# Patient Record
Sex: Male | Born: 1955
Health system: Southern US, Community
[De-identification: ages and names within clinical notes are randomized; demographics above are authoritative.]

## PROBLEM LIST (undated history)

## (undated) DIAGNOSIS — E785 Hyperlipidemia, unspecified: Secondary | ICD-10-CM

## (undated) DIAGNOSIS — K219 Gastro-esophageal reflux disease without esophagitis: Secondary | ICD-10-CM

## (undated) DIAGNOSIS — B059 Measles without complication: Secondary | ICD-10-CM

## (undated) DIAGNOSIS — M7752 Other enthesopathy of left foot: Secondary | ICD-10-CM

## (undated) DIAGNOSIS — I10 Essential (primary) hypertension: Secondary | ICD-10-CM

## (undated) DIAGNOSIS — Z8719 Personal history of other diseases of the digestive system: Secondary | ICD-10-CM

## (undated) DIAGNOSIS — J189 Pneumonia, unspecified organism: Secondary | ICD-10-CM

## (undated) DIAGNOSIS — I2510 Atherosclerotic heart disease of native coronary artery without angina pectoris: Secondary | ICD-10-CM

## (undated) DIAGNOSIS — R55 Syncope and collapse: Secondary | ICD-10-CM

## (undated) DIAGNOSIS — Z8709 Personal history of other diseases of the respiratory system: Secondary | ICD-10-CM

## (undated) DIAGNOSIS — R002 Palpitations: Secondary | ICD-10-CM

## (undated) DIAGNOSIS — Z8701 Personal history of pneumonia (recurrent): Secondary | ICD-10-CM

## (undated) DIAGNOSIS — E669 Obesity, unspecified: Secondary | ICD-10-CM

## (undated) DIAGNOSIS — B269 Mumps without complication: Secondary | ICD-10-CM

## (undated) DIAGNOSIS — E119 Type 2 diabetes mellitus without complications: Secondary | ICD-10-CM

## (undated) DIAGNOSIS — I209 Angina pectoris, unspecified: Secondary | ICD-10-CM

## (undated) DIAGNOSIS — G629 Polyneuropathy, unspecified: Secondary | ICD-10-CM

## (undated) DIAGNOSIS — I499 Cardiac arrhythmia, unspecified: Secondary | ICD-10-CM

## (undated) DIAGNOSIS — R079 Chest pain, unspecified: Secondary | ICD-10-CM

## (undated) DIAGNOSIS — M1990 Unspecified osteoarthritis, unspecified site: Secondary | ICD-10-CM

## (undated) DIAGNOSIS — J449 Chronic obstructive pulmonary disease, unspecified: Secondary | ICD-10-CM

## (undated) HISTORY — DX: Palpitations: R00.2

## (undated) HISTORY — DX: Other enthesopathy of left foot and ankle: M77.52

## (undated) HISTORY — DX: Mumps without complication: B26.9

## (undated) HISTORY — PX: COLONOSCOPY: SHX174

## (undated) HISTORY — DX: Obesity, unspecified: E66.9

## (undated) HISTORY — PX: EYE SURGERY: SHX253

## (undated) HISTORY — PX: FOOT SURGERY: SHX648

## (undated) HISTORY — DX: Chest pain, unspecified: R07.9

## (undated) HISTORY — DX: Measles without complication: B05.9

## (undated) HISTORY — DX: Syncope and collapse: R55

## (undated) HISTORY — DX: Type 2 diabetes mellitus without complications: E11.9

## (undated) HISTORY — PX: CARDIAC CATHETERIZATION: SHX172

## (undated) HISTORY — DX: Hyperlipidemia, unspecified: E78.5

## (undated) HISTORY — DX: Essential (primary) hypertension: I10

## (undated) HISTORY — DX: Angina pectoris, unspecified: I20.9

## (undated) HISTORY — PX: ANTERIOR CERVICAL DISCECTOMY: SHX1160

---

## 2013-03-21 HISTORY — PX: SPINE SURGERY: SHX786

## 2013-07-25 ENCOUNTER — Ambulatory Visit: Payer: 59

## 2013-11-13 ENCOUNTER — Ambulatory Visit: Payer: 59 | Attending: Family Medicine

## 2013-11-13 DIAGNOSIS — IMO0001 Reserved for inherently not codable concepts without codable children: Secondary | ICD-10-CM | POA: Diagnosis present

## 2013-11-13 DIAGNOSIS — M545 Low back pain, unspecified: Secondary | ICD-10-CM | POA: Diagnosis not present

## 2014-01-31 LAB — HEMOGLOBIN A1C: Hgb A1c MFr Bld: 11.5 % — AB (ref 4.0–6.0)

## 2014-02-14 ENCOUNTER — Ambulatory Visit (INDEPENDENT_AMBULATORY_CARE_PROVIDER_SITE_OTHER): Payer: 59 | Admitting: Endocrinology

## 2014-02-14 ENCOUNTER — Encounter: Payer: 59 | Attending: Endocrinology | Admitting: Nutrition

## 2014-02-14 ENCOUNTER — Encounter: Payer: Self-pay | Admitting: Endocrinology

## 2014-02-14 VITALS — BP 122/59 | HR 65 | Temp 98.0°F | Resp 16 | Ht 70.75 in | Wt 206.0 lb

## 2014-02-14 DIAGNOSIS — Z794 Long term (current) use of insulin: Secondary | ICD-10-CM | POA: Insufficient documentation

## 2014-02-14 DIAGNOSIS — E785 Hyperlipidemia, unspecified: Secondary | ICD-10-CM

## 2014-02-14 DIAGNOSIS — E1165 Type 2 diabetes mellitus with hyperglycemia: Secondary | ICD-10-CM | POA: Diagnosis not present

## 2014-02-14 DIAGNOSIS — E1140 Type 2 diabetes mellitus with diabetic neuropathy, unspecified: Secondary | ICD-10-CM | POA: Diagnosis not present

## 2014-02-14 DIAGNOSIS — E669 Obesity, unspecified: Secondary | ICD-10-CM

## 2014-02-14 DIAGNOSIS — E1169 Type 2 diabetes mellitus with other specified complication: Secondary | ICD-10-CM | POA: Insufficient documentation

## 2014-02-14 DIAGNOSIS — IMO0002 Reserved for concepts with insufficient information to code with codable children: Secondary | ICD-10-CM

## 2014-02-14 MED ORDER — INSULIN GLARGINE 300 UNIT/ML ~~LOC~~ SOPN: 120.0000 [IU] | PEN_INJECTOR | Freq: Every day | SUBCUTANEOUS | Status: DC

## 2014-02-14 MED ORDER — INSULIN LISPRO 200 UNIT/ML ~~LOC~~ SOPN: 40.0000 [IU] | PEN_INJECTOR | Freq: Three times a day (TID) | SUBCUTANEOUS | Status: DC

## 2014-02-14 NOTE — Patient Instructions (Addendum)
TOUJEO insulin: This insulin provides blood sugar control for  24 hours.   Start with 120 units at breakfast daily and increase by 5 units every 3-4 days until the waking up sugars are under 150. Then continue the same dose.  If blood sugar is under 90 for 2 days in a row, reduce the dose by 10 units. Note that this insulin does not control the rise of blood sugar with meals   HUMALOG U-200 insulin: Take this about 15 minutes before each main meal Start taking 70 units at breakfast, 40 units at lunch and 70 units before supper If the blood sugar about 2 hours after any particular meal is still over 200 and may increase to Humalog by 5 units for that meal  Check the blood sugar before breakfast and at least once or twice a day about 2 hours after meals, bring monitor for review

## 2014-02-14 NOTE — Progress Notes (Signed)
Patient ID: Aristeo Hankerson, male   DOB: 04/15/1955, 58 y.o.   MRN: [id number]           Reason for Appointment: Consultation for Type 2 Diabetes  Referring physician: Girtha Rm  History of Present Illness:          Diagnosis: Type 2 diabetes mellitus, date of diagnosis: 12/2011      Past history:  At the time of diagnosis he had symptoms of frequent urination, thirst and blurred vision He thinks his blood sugar was about 600.  His physician started him on Novolog and also Metformin  Details of initial treatment are not available but he also apparently took Victoza at some point before insulin and he does not think it worked After some time he was also given Levemir in addition to his NovoLog His blood sugars were somewhat better controlled after some time and were averaging about 200. He thinks that because of diarrhea his metformin was stopped after a few months.  Apparently in 10/2012 he had epidural steroid and with this his blood sugar went up to 600.   He had continued to have low back pain also has not had  repeat epidural steroid injections However he thinks his blood sugars had been persistently poorly controlled since then. He has been under the care of an endocrinologist in [place]. Actos was started in 06/2012 without much benefit. He was also tried on Invokana for about 3 months and reportedly had no benefit from this.  Recent history:  He was started on U-500 instead of Levemir and NovoLog in late 2014 The dose has been adjusted periodically but has been about the same since about 04/2013 His blood sugars have been continuing to be poorly controlled with A1c ranging from 10.5-12.5 and recently 11.5. He also had been recommended an insulin pump but he did not want to do this because of the cost and difficulty using it. His insulin regimen is listed below, taking his insulin about 15-30 30 minutes before breakfast and lunch but 1 hour before dinner; last dose is at bedtime. He did  not bring his blood sugar records but his usually checking his sugar 3-4 times a day. He thinks that his blood sugars are progressively higher later in the day. He does occasionally have symptoms of hypoglycemia, mostly midday between 11 AM-3 PM, usually about once a week He is usually having excessive thirst and is drinking a lot of water.  Usually has sugar-free drinks otherwise. He is also tending to lose weight recently He has seen the dietitian and has been trying to usually get low carbohydrate low fat meals. Hypoglycemia: As above  Oral hypoglycemic drugs the patient is taking are: Actos 45 mg      Side effects from medications have been: Diarrhea from metformin INSULIN regimen is described as:  U-500 insulin   Compliance with the medical regimen: Good   Glucose monitoring:  done 2-4 times a day         Glucometer: Contour next      Blood Glucose readings by recall  PREMEAL Breakfast Lunch Dinner Bedtime  Overall   Glucose range:  350   400   500   550    Median:        Self-care: The diet that the patient has been following is: tries to limit .     Meals: 3 meals per day. Breakfast is usually egg and toast, lunch is usually a meat and crackers, evening meal usually chicken  and vegetables.  Snacks: Popcorn, orange or apple           Exercise:  walking up to 30 minutes, about 3 times a week.          Dietician visit, most recent: 11/2012 .               Weight history: 202-236  Wt Readings from Last 3 Encounters:  02/14/14 206 lb (93.441 kg)   Glycemic control:   Lab Results  Component Value Date   HGBA1C 11.5* 01/31/2014   No results found for: GLUF, MICROALBUR, [place], CREATININE       Medication List       This list is accurate as of: 02/14/14  8:22 PM.  Always use your most recent med list.               alprazolam 2 MG tablet  Commonly known as:  XANAX     atorvastatin 40 MG tablet  Commonly known as:  LIPITOR     BAYER CONTOUR NEXT TEST VI  by In  Vitro route. Checks 4 times daily     buPROPion 150 MG 12 hr tablet  Commonly known as:  WELLBUTRIN SR     gabapentin 600 MG tablet  Commonly known as:  NEURONTIN     HUMULIN R 500 UNIT/ML Soln injection  Generic drug:  insulin regular human CONCENTRATED  25-20-20-15     Insulin Glargine 300 UNIT/ML Sopn  Commonly known as:  TOUJEO SOLOSTAR  Inject 120 Units into the skin daily before breakfast.     Insulin Lispro (Human) 200 UNIT/ML Sopn  Commonly known as:  HUMALOG KWIKPEN  Inject 40-70 Units into the skin 3 (three) times daily before meals.     LANOXIN 0.25 MG tablet  Generic drug:  digoxin  Take 0.25 mg by mouth daily.     lisinopril 5 MG tablet  Commonly known as:  PRINIVIL,ZESTRIL     pioglitazone 45 MG tablet  Commonly known as:  ACTOS  Take 45 mg by mouth daily.        Allergies:  Allergies  Allergen Reactions  . Prednisone     Patient allergic to steroids    Past Medical History  Diagnosis Date  . Diabetes mellitus without complication     Past Surgical History  Procedure Laterality Date  . Spine surgery  03/21/13    Lumbar fusion    Family History  Problem Relation Age of Onset  . Diabetes Mother   . Diabetes Father   . Heart disease Father   . Diabetes Brother     Social History:  reports that he quit smoking about 10 months ago. He has never used smokeless tobacco. His alcohol and drug histories are not on file.    Review of Systems       Vision is normal. Most recent eye exam was in 2012       Lipids: In 12/15 showed LDL 69, HDL 24 and triglycerides 168.  Has been on Lipitor for quite some time      No results found for: CHOL, HDL, LDLCALC, LDLDIRECT, TRIG, CHOLHDL                Skin: No rash or infections     Thyroid:  No  unusual fatigue.  No history of thyroid disease     The blood pressure has been normal without medications, is taking low-dose lisinopril for "kidney protection"     No swelling  of feet.     No shortness  of breath or chest tightness  on exertion.    He has had known CAD but not symptomatic     Bowel habits: Normal.      No joint  pains. Back pain has been significant and persistent, takes oxycodone as needed     No recent urinary symptoms.  Has nocturia 1-2 times.  Has history of candida balanitis      Has history of anxiety on medications         He has a history of left leg  Numbness, tingling and burning and also some in his left foot.  Taking gabapentin high doses for this.  LABS:  Office Visit on 02/14/2014  Component Date Value Ref Range Status  . Hgb A1c MFr Bld 01/31/2014 11.5* 4.0 - 6.0 % Final    Physical Examination:  BP 122/59 mmHg  Pulse 65  Temp(Src) 98 F (36.7 C)  Resp 16  Ht 5' 10.75" (1.797 m)  Wt 206 lb (93.441 kg)  BMI 28.94 kg/m2  SpO2 95%  GENERAL:  He is averagely built and nourished, low abdominal obesity HEENT:         Eye exam shows normal external appearance. Fundus exam shows no retinopathy. Oral exam shows normal mucosa .  NECK:         General:  Neck exam shows no lymphadenopathy. Carotids are normal to palpation and no bruit heard.  Thyroid is not enlarged and no nodules felt.   LUNGS:         Chest is symmetrical. Lungs are clear to auscultation.Marland Kitchen   HEART:         Heart sounds:  S1 and S2 are normal. No murmurs or clicks heard., no S3 or S4.   ABDOMEN:   There is no distention present. Liver and spleen are not palpable. No other mass or tenderness present.  EXTREMITIES:     There is no edema. No skin lesions present.Marland Kitchen  NEUROLOGICAL:   Vibration sense is moderately reduced in toes. Ankle jerks are absent bilaterally.          Diabetic foot exam shows significantly decreased monofilament sensation in the toes especially left fifth toe, no skin lesions or ulcers on the feet and normal pedal pulses MUSCULOSKELETAL:       There is no enlargement or deformity of the joints. Spine is normal to inspection.Marland Kitchen   SKIN:       No rash or lesions of  concern.        ASSESSMENT:  Diabetes type 2, uncontrolled   The patient appears to be highly insulin deficient and also has significant resistance requiring about 400 units of insulin a day without much benefit. He is also symptomatic from the hyperglycemia and this may be causing weight loss also This is despite being very compliant with his diet regimen and low carbohydrate intake. He is moderately active   Unclear why he is still requiring large doses of insulin even with taking Actos as a sensitizer He thinks he has failed trials of Victoza and Invokana in the past although  history is not available from his previous endocrinologist  Discussed with the patient that because of his high insulin requirement he is a good candidate for insulin pump which would provide more efficient insulin delivery uncontrolled.  However he is very reluctant to do this partly because of cost and also poor understanding of how this works. His current regimen of U-500 insulin  is not providing consistent control with some tendency to hypoglycemia midday and continued overnight hyperglycemia.  He is reluctant to continue this regimen  Complications: Peripheral neuropathy, unknown urine microalbumin status  History of hyperlipidemia, with good control of LDL but persistently low HDL Also reportedly has CAD, asymptomatic  Has had minimal hypertension controlled with lisinopril 5 mg  Chronic persistent low back pain  PLAN:   He will be seen by the diabetes nurse educator today for detailed diabetes education and explanation of how insulin pump therapy would work  We will try him on a combination of basal and bolus insulin regimen using U-300 glargine/Toujeo and also U-200 Humalog insulin at mealtimes.  Detailed insulin doses written out and instructions for titration also given in detail.  He will need relatively large doses of Humalog at breakfast and supper and less at lunch when he has less carbohydrate.   These were reviewed by the nurse educator also with him.  Given patient handouts on Toujeo and Humalog U-200 along with co-pay cards  May consider a trial of extended release metformin possibly in combination with [place] or Januvia for better tolerability and benefit.  Recommended regular eye exams  He will bring his glucose monitor for download on the next visit in 3 weeks  Also recommended bringing records from his previous endocrinologist for review  Patient Instructions  TOUJEO insulin: This insulin provides blood sugar control for  24 hours.   Start with 120 units at breakfast daily and increase by 5 units every 3-4 days until the waking up sugars are under 150. Then continue the same dose.  If blood sugar is under 90 for 2 days in a row, reduce the dose by 10 units. Note that this insulin does not control the rise of blood sugar with meals   HUMALOG U-200 insulin: Take this about 15 minutes before each main meal Start taking 70 units at breakfast, 40 units at lunch and 70 units before supper If the blood sugar about 2 hours after any particular meal is still over 200 and may increase to Humalog by 5 units for that meal  Check the blood sugar before breakfast and at least once or twice a day about 2 hours after meals, bring monitor for review    Counseling time over 50% of today's 60 minute visit  Consultation note forwarded to PCP  [facility] 02/14/2014, 8:22 PM   Note: This office note was prepared with Dragon voice recognition system technology. Any transcriptional errors that result from this process are unintentional.

## 2014-02-20 ENCOUNTER — Telehealth: Payer: Self-pay | Admitting: Nutrition

## 2014-02-20 ENCOUNTER — Other Ambulatory Visit: Payer: Self-pay | Admitting: *Deleted

## 2014-02-20 ENCOUNTER — Telehealth: Payer: Self-pay | Admitting: Endocrinology

## 2014-02-20 MED ORDER — GLUCOSE BLOOD VI STRP: ORAL_STRIP | Status: DC

## 2014-02-20 NOTE — Progress Notes (Signed)
This is a new patient that is here to start on a new insulin regimen.  We discussed the need for basal insulin and then discussed the timing of Toujeo.  We reviewed how to use the pen and he reported good understanding of this.  Written dosage was given to him of 120u before breakfast.  We then discussed the need to increase the dose if his fasting blood sugars do not come down.  He was told to increase this dose by 5u every 4 days until his FBSs are less than 150.  He reported good understanding of this.    We also discussed the U200 Humalog insulin and when and how to take this.  Written instructions were given for 70u 15 min. Before breakfast, 40u before lunch and 70u before supper.  We discussed the need to increase/decrease this dose when eating more carbohydrates, or when exercising.  He has never done this before and so we reviewed what carbs were, and the need to take more Humalog when eating more carbs in each meal.  He reported good understanding of this and had no questions.  We reviewed why he needs to test his blood sugars ac and HS, and why he needs to do this 2 hours after one meal each day.  Prescriptions were increase for test strips  We also reviewed low blood sugars--symptoms and treatment.  He has not had any of these in a "very long time", but he used to treat them with nabs and chocolate candy.  He will use glucose tablets, or juice of soda if needed.  He was given a sample of glucose tablets to keep with him if needed.  We discussed the advantages of insulin pump therapy, and he was shown some different models.  He does not want to do this at this time.  He says that he has several grandchildren that "climb all over him, and he has a pool they swim in constantly all summer".  He would prefer to try to control his blood sugars without the use of the insulin pump at this time.    He will call if questions.

## 2014-02-20 NOTE — Telephone Encounter (Signed)
Pt needs refills on test strips for contour next ez call into [facility]

## 2014-02-20 NOTE — Telephone Encounter (Signed)
Patient reports that his pharmacy did not have the medications and he has not started on the new insulin yet.  He says that he was notified that it came in today, and he will start on this tomorrow morning.  His FBS today was in the 300s.

## 2014-02-20 NOTE — Patient Instructions (Signed)
Test before meals, at bedtime and 2hr. After one meal each day Take 120u of Toujeo insulin every morning before breakfast.  Increase the dose by 5u q 4 days until FBSs are less than 150. Take 70u of U 200 Humalog before breakfast, 40u before lunch and 70u before supper.  Do this 15 min. Before the meal.

## 2014-02-20 NOTE — Telephone Encounter (Signed)
rx sent

## 2014-02-20 NOTE — Telephone Encounter (Signed)
Message left on machine to call me with blood sugar readings. 

## 2014-02-27 ENCOUNTER — Telehealth: Payer: Self-pay

## 2014-02-27 NOTE — Telephone Encounter (Signed)
Spoke with Jinny Blossom from referring  Dr  Hali Marry Office at [facility] [phone] she stated she will fax over records. Pt has also seen Dr Sherryle Lis at [facility] , pt will need to sign  medical release to get those records. I left message with pt on his phone to call [facility]:.I will  Inform  pt to retreive his records himself or possibly come by to sign medical record release form  before appt ..daj

## 2014-02-27 NOTE — Telephone Encounter (Signed)
RECORDS RECEIVED DAJ

## 2014-02-28 NOTE — Telephone Encounter (Signed)
RECORDS ON FILE : WILL VERIFY WITH PT TO SEE IF WE NEED TO OBTAIN ANY MORE RECORDS

## 2014-03-01 ENCOUNTER — Ambulatory Visit (INDEPENDENT_AMBULATORY_CARE_PROVIDER_SITE_OTHER): Payer: 59 | Admitting: Cardiology

## 2014-03-01 ENCOUNTER — Encounter: Payer: Self-pay | Admitting: Cardiology

## 2014-03-01 VITALS — BP 120/78 | HR 65 | Ht 70.0 in | Wt 219.4 lb

## 2014-03-01 DIAGNOSIS — R002 Palpitations: Secondary | ICD-10-CM | POA: Insufficient documentation

## 2014-03-01 DIAGNOSIS — E785 Hyperlipidemia, unspecified: Secondary | ICD-10-CM

## 2014-03-01 DIAGNOSIS — R079 Chest pain, unspecified: Secondary | ICD-10-CM | POA: Insufficient documentation

## 2014-03-01 MED ORDER — NITROGLYCERIN 0.4 MG SL SUBL
0.4000 mg | SUBLINGUAL_TABLET | SUBLINGUAL | Status: DC | PRN
Start: 2014-03-01 — End: 2017-01-26

## 2014-03-01 NOTE — Progress Notes (Signed)
[place] Mata Date of Birth:  [birth year] [place] [address] [place], [place]  [address] [phone]        Fax   [phone]   History of Present Illness: This pleasant 59 year old gentleman is seen by me for the first time today.  He is a medical patient of Dr. Hali Marry in [place].  The patient is now reestablishing care in the [place] area.  His wife works for the [employer] system as a Building control surveyor. The patient is being seen because of pressure no chest discomfort.  He has multiple risk factors for ischemic heart disease.  He had a heart catheterization in 2005 which showed only mild plaque.  Had a second heart catheterization in 2014 in [facility] which showed nonobstructive coronary artery disease and an ejection fraction of 65%.  The patient has a history of paroxysmal supraventricular tachycardia and is on digoxin 0.25 mg daily the patient had had prediabetes for about 10 years and 2 years ago became a full blown diabetic.  His most recent hemoglobin A1c is 11.5.  He is now being followed for his diabetes here in [place] by Dr. Dwyane Dee. The patient drinks 2-3 cups of caffeinated coffee in the morning.  He does not consume any alcohol.  He continues to smoke a half a pack of cigarettes a day against advice. He has a history of chronic back pain and is on permanent medical disability because of his back. His family history reveals that his mother died of a heart attack at age 53 and his father died of a massive heart attack at age 89.  He has a brother with atrial fibrillation and a sister who has a pacemaker.  Overall maternal uncles who have died from heart attacks.   Current Outpatient Prescriptions  Medication Sig Dispense Refill  . alprazolam (XANAX) 2 MG tablet   5  . aspirin 81 MG tablet Take 81 mg by mouth daily.    Marland Kitchen atorvastatin (LIPITOR) 40 MG tablet   3  . buPROPion (WELLBUTRIN SR) 150 MG 12 hr tablet    10  . digoxin (LANOXIN) 0.25 MG tablet Take 0.25 mg by mouth daily.    Marland Kitchen gabapentin (NEURONTIN) 600 MG tablet   3  . glucose blood (BAYER CONTOUR NEXT TEST) test strip Use to check blood sugar 5 times per day dx code E11.65 150 each 3  . HUMULIN R 500 UNIT/ML SOLN injection 25-20-20-15  3  . Insulin Glargine (TOUJEO SOLOSTAR) 300 UNIT/ML SOPN Inject 120 Units into the skin daily before breakfast. 6 pen 1  . Insulin Lispro, Human, (HUMALOG KWIKPEN) 200 UNIT/ML SOPN Inject 40-70 Units into the skin 3 (three) times daily before meals. 6 pen 3  . lisinopril (PRINIVIL,ZESTRIL) 5 MG tablet   3  . pioglitazone (ACTOS) 45 MG tablet Take 45 mg by mouth daily.    . nitroGLYCERIN (NITROSTAT) 0.4 MG SL tablet Place 1 tablet (0.4 mg total) under the tongue every 5 (five) minutes as needed for chest pain. 90 tablet 3   No current facility-administered medications for this visit.    Allergies  Allergen Reactions  . Prednisone     Patient allergic to steroids    Patient Active Problem List   Diagnosis Date Noted  . Chest pain 03/01/2014  . Intermittent palpitations 03/01/2014  . Type II diabetes mellitus, uncontrolled 02/14/2014  . Painful diabetic neuropathy 02/14/2014  . Hyperlipidemia 02/14/2014  History  Smoking status  . Former Smoker  . Quit date: 03/17/2013  Smokeless tobacco  . Never Used    History  Alcohol Use: Not on file    Family History  Problem Relation Age of Onset  . Diabetes Mother   . Diabetes Father   . Heart disease Father   . Diabetes Brother     Review of Systems: Constitutional: no fever chills diaphoresis or fatigue or change in weight.  Head and neck: no hearing loss, no epistaxis, no photophobia or visual disturbance. Respiratory: No cough, shortness of breath or wheezing. Cardiovascular: No chest pain peripheral edema, palpitations. Gastrointestinal: No abdominal distention, no abdominal pain, no change in bowel habits hematochezia or  melena. Genitourinary: No dysuria, no frequency, no urgency, no nocturia. Musculoskeletal:No arthralgias, no back pain, no gait disturbance or myalgias. Neurological: No dizziness, no headaches, no numbness, no seizures, no syncope, no weakness, no tremors. Hematologic: No lymphadenopathy, no easy bruising. Psychiatric: No confusion, no hallucinations, no sleep disturbance.   Wt Readings from Last 3 Encounters:  03/01/14 219 lb 6.4 oz (99.519 kg)  02/14/14 206 lb (93.441 kg)    Physical Exam: Filed Vitals:   03/01/14 1106  BP: 120/78  Pulse: 65   the general appearance reveals a well-developed well-nourished gentleman in no distress.  The patient appears to be in no distress.  Head and neck exam reveals that the pupils are equal and reactive.  The extraocular movements are full.  There is no scleral icterus.  Mouth and pharynx are benign.  No lymphadenopathy.  No carotid bruits.  The jugular venous pressure is normal.  Thyroid is not enlarged or tender.  Chest reveals mild inspiratory rales at right base.  Expansion of the chest is symmetrical.  Heart reveals no abnormal lift or heave.  First and second heart sounds are normal.  There is no murmur gallop rub or click.  The abdomen is soft and nontender.  Bowel sounds are normoactive.  There is no hepatosplenomegaly or mass.  There are no abdominal bruits.  Extremities reveal no phlebitis or edema.  Pedal pulses are good.  There is no cyanosis or clubbing.  Neurologic exam is normal strength and no lateralizing weakness.  No sensory deficits.  Integument reveals no rash  EKG today shows normal sinus rhythm and is within normal limits  Assessment / Plan: 1.  Exertional chest discomfort 2.  Multiple risk factors for premature coronary artery disease including strong family history, dyslipidemia, poorly controlled diabetes with hemoglobin A1c of 11.5, and ongoing smoking 3.  History of paroxysmal tachycardia controlled on  digoxin 4.  On medical disability since April 2015 because of chronic back pain.  History of major back surgery in January 2015.  Disposition: We will start him on baby aspirin 81 mg daily.  We gave him a prescription for Nitrostat to have on hand.  We will get a chest x-ray.  He does have a few rales at the right base which may be related to his smoking.  We will have him return for a walking Lexiscan Myoview stress test. Recheck here in one month for follow-up office visit

## 2014-03-01 NOTE — Patient Instructions (Signed)
START ASPIRIN 81 MG DAILY  Use your NTG under your tongue for recurrent chest pain. May take one tablet every 5 minutes. If you are still having discomfort after 3 tablets in 15 minutes, call 911.   Your physician has requested that you have a lexiscan myoview. For further information please visit [email or web address]. Please follow instruction sheet, as given. WALKING LEXISCAN  A chest x-ray takes a picture of the organs and structures inside the chest, including the heart, lungs, and blood vessels. This test can show several things, including, whether the heart is enlarges; whether fluid is building up in the lungs; and whether pacemaker / defibrillator leads are still in place. [place]  Your physician recommends that you schedule a follow-up appointment in: [place]

## 2014-03-03 ENCOUNTER — Encounter: Payer: 59 | Attending: Endocrinology

## 2014-03-03 VITALS — Ht 70.75 in | Wt 218.9 lb

## 2014-03-03 DIAGNOSIS — IMO0002 Reserved for concepts with insufficient information to code with codable children: Secondary | ICD-10-CM

## 2014-03-03 DIAGNOSIS — E1140 Type 2 diabetes mellitus with diabetic neuropathy, unspecified: Secondary | ICD-10-CM | POA: Insufficient documentation

## 2014-03-03 DIAGNOSIS — E1165 Type 2 diabetes mellitus with hyperglycemia: Secondary | ICD-10-CM | POA: Insufficient documentation

## 2014-03-03 DIAGNOSIS — Z794 Long term (current) use of insulin: Secondary | ICD-10-CM | POA: Insufficient documentation

## 2014-03-06 ENCOUNTER — Telehealth: Payer: Self-pay | Admitting: Nutrition

## 2014-03-06 NOTE — Telephone Encounter (Signed)
Patient phoned and told of the new insulin doses.  He re verbalized the doses correctly.   He was told to notify us if he has a severe low blood sugar requiring assistance from someone else/ passing out.  He agreed to do this.

## 2014-03-06 NOTE — Telephone Encounter (Signed)
If he is still taking 120 units Toujeo reduce it to 110 units Reduce morning Humalog to 30 units when planning to be active otherwise take 50 units at breakfast Reduce lunchtime dose to 20 units and suppertime dose to 60 units, needs more blood sugars after supper

## 2014-03-06 NOTE — Telephone Encounter (Signed)
Blood sugars as follows:  Started Toujeo on 02/20/14 Date            acB                acL                    acS                      HS 12/30          122                119        66           98                       64 12/31          122                131                       72 1/1              156                120          49        101 1/2              110                 86           51         51         243      38(11PM) 1/3              164      79 1/4                73                114                      187 1/5                80     *         217                59  168         * felt low, did not test 1/6               123    *                          57        1/7               121              222                       220 1/8               138             160  121 1/9                89       **     64     113    240   236         **very active that morning 1/10              92       **    39(pt. Passed out)  60acL    212acS 1/11             105              77                       138 1/12             103 Patient is very pleased with blood sugar reading.  "Has not seen them this low ever". Pt. Reports that he is eating balanced meals with 2 servings of carbs usually with meals.  Having 3 peanut butter crackers between meals every day. Discussed the need to reduce the premeal Humalog dose by 25% if going to be active after the meal, or snacking while working if he did not reduce the dose.  He reported good understanding of this idea. Plan:  Decrease dose of Humalog: 60/30/60.  Sent to Dr. Dwyane Dee for approval

## 2014-03-07 LAB — HM DIABETES EYE EXAM

## 2014-03-12 ENCOUNTER — Ambulatory Visit (HOSPITAL_COMMUNITY): Payer: 59 | Attending: Internal Medicine | Admitting: Radiology

## 2014-03-12 DIAGNOSIS — Z794 Long term (current) use of insulin: Secondary | ICD-10-CM | POA: Diagnosis not present

## 2014-03-12 DIAGNOSIS — E785 Hyperlipidemia, unspecified: Secondary | ICD-10-CM | POA: Diagnosis not present

## 2014-03-12 DIAGNOSIS — E119 Type 2 diabetes mellitus without complications: Secondary | ICD-10-CM | POA: Insufficient documentation

## 2014-03-12 DIAGNOSIS — I10 Essential (primary) hypertension: Secondary | ICD-10-CM | POA: Insufficient documentation

## 2014-03-12 DIAGNOSIS — R0789 Other chest pain: Secondary | ICD-10-CM | POA: Insufficient documentation

## 2014-03-12 DIAGNOSIS — R079 Chest pain, unspecified: Secondary | ICD-10-CM

## 2014-03-12 DIAGNOSIS — R0609 Other forms of dyspnea: Secondary | ICD-10-CM | POA: Insufficient documentation

## 2014-03-12 MED ORDER — TECHNETIUM TC 99M SESTAMIBI GENERIC - CARDIOLITE
10.0000 | Freq: Once | INTRAVENOUS | Status: AC | PRN
  Administered 2014-03-12: 10 via INTRAVENOUS

## 2014-03-12 MED ORDER — REGADENOSON 0.4 MG/5ML IV SOLN
0.4000 mg | Freq: Once | INTRAVENOUS | Status: AC
  Administered 2014-03-12: 0.4 mg via INTRAVENOUS

## 2014-03-12 MED ORDER — TECHNETIUM TC 99M SESTAMIBI GENERIC - CARDIOLITE
30.0000 | Freq: Once | INTRAVENOUS | Status: AC | PRN
  Administered 2014-03-12: 30 via INTRAVENOUS

## 2014-03-12 NOTE — Progress Notes (Signed)
  [facility] [address] [place], [place] [address] [phone]    Cardiology Nuclear Med Study  Arthur Mata is a 59 y.o. male     MRN : [id number]     DOB: 07/14/1955  Procedure Date: 03/12/2014  Nuclear Med Background Indication for Stress Test:  Evaluation for Ischemia History:  N/O CAD, SVT  Cardiac Risk Factors: Hypertension, Lipids and IDDM  Symptoms:  Chest Pressure and DOE   Nuclear Pre-Procedure Caffeine/Decaff Intake:  7:00pm NPO After: 7:00pm   Lungs:  clear O2 Sat: 98% on room air. IV 0.9% NS with Angio Cath:  22g  IV Site: R Hand  IV Started by:  Matilde Haymaker, RN  Chest Size (in):  44 Cup Size: n/a  Height: 5\' 10"  (1.778 m)  Weight:  216 lb (97.977 kg)  BMI:  Body mass index is 30.99 kg/(m^2). Tech Comments:  n/a    Nuclear Med Study 1 or 2 day study: 1 day  Stress Test Type:  Lexiscan  Reading MD: n/a  Order Authorizing Provider:  Henrene Dodge  Resting Radionuclide: Technetium 42m Sestamibi  Resting Radionuclide Dose: 11.0 mCi   Stress Radionuclide:  Technetium 56m Sestamibi  Stress Radionuclide Dose: 33.0 mCi           Stress Protocol Rest HR: 62 Stress HR: 86  Rest BP: 113/63 Stress BP: 129/62  Exercise Time (min): n/a METS: n/a   Predicted Max HR: 163 bpm % Max HR: 52.76 bpm Rate Pressure Product: 11094   Dose of Adenosine (mg):  n/a Dose of Lexiscan: 0.4 mg  Dose of Atropine (mg): n/a Dose of Dobutamine: n/a mcg/kg/min (at max HR)  Stress Test Technologist: Perrin Maltese, EMT-P  Nuclear Technologist:  Earl Many, CNMT     Rest Procedure:  Myocardial perfusion imaging was performed at rest 45 minutes following the intravenous administration of Technetium 47m Sestamibi. Rest ECG: NSR - Normal EKG  Stress Procedure:  The patient received IV Lexiscan 0.4 mg over 15-seconds.  Technetium 54m Sestamibi injected at 30-seconds. This patient was dizzy and felt funny with the Lexiscan injection.  Quantitative spect images were obtained after a 45 minute delay. Stress ECG: No significant change from baseline ECG  QPS Raw Data Images:  Normal; no motion artifact; normal heart/lung ratio. Stress Images:  Normal homogeneous uptake in all areas of the myocardium. Rest Images:  Normal homogeneous uptake in all areas of the myocardium. Subtraction (SDS):  No evidence of ischemia. Transient Ischemic Dilatation (Normal <1.22):  1.03 Lung/Heart Ratio (Normal <0.45):  0.34  Quantitative Gated Spect Images QGS EDV:  98 ml QGS ESV:  36 ml  Impression Exercise Capacity:  Lexiscan with no exercise. BP Response:  Normal blood pressure response. Clinical Symptoms:  No chest pain. ECG Impression:  No significant ST segment change suggestive of ischemia. Comparison with Prior Nuclear Study: No previous nuclear study performed  Overall Impression:  Normal stress nuclear study.  LV Ejection Fraction: 63%.  LV Wall Motion:  NL LV Function; NL Wall Motion  Darlin Coco MD

## 2014-03-13 NOTE — Progress Notes (Signed)
Patient was seen on 03/03/14 for the complete diabetes self-management series at the [facility]. This is a part of the [facility] [employer].  Handouts given during class include:  Living Well with Diabetes book  Carb Counting and Meal Planning book  Meal Plan Card  Carbohydrate guide  Meal planning worksheet  Low Sodium Flavoring Tips  The diabetes portion plate  Low Carbohydrate Snack Suggestions  A1c to eAG Conversion Chart  Diabetes Medications  Stress Management  Diabetes Recommended Care Schedule  Diabetes Success Plan  Core Class Satisfaction Survey  The following learning objectives were met by the patient during this course:  Describe diabetes  State some common risk factors for diabetes  Defines the role of glucose and insulin  Identifies type of diabetes and pathophysiology  Describe the relationship between diabetes and cardiovascular risk  State the members of the Healthcare Team  States the rationale for glucose monitoring  State when to test glucose  State their individual Target Range  State the importance of logging glucose readings  Describe how to interpret glucose readings  Identifies A1C target  Explain the correlation between A1c and eAG values  State symptoms and treatment of high blood glucose  State symptoms and treatment of low blood glucose  Explain proper technique for glucose testing  Identifies proper sharps disposal  Describe the role of different macronutrients on glucose  Explain how carbohydrates affect blood glucose  State what foods contain the most carbohydrates  Demonstrate carbohydrate counting  Demonstrate how to read Nutrition Facts food label  Describe effects of various fats on heart health  Describe the importance of good nutrition for health and healthy eating strategies  Describe techniques for managing your shopping, cooking and meal planning  List  strategies to follow meal plan when dining out  Describe the effects of alcohol on glucose and how to use it safely . State the amount of activity recommended for healthy living . Describe activities suitable for individual needs . Identify ways to regularly incorporate activity into daily life . Identify barriers to activity and ways to over come these barriers  Identify diabetes medications being personally used and their primary action for lowering glucose and possible side effects . Describe role of stress on blood glucose and develop strategies to address psychosocial issues . Identify diabetes complications and ways to prevent them  Explain how to manage diabetes during illness . Evaluate success in meeting personal goal . Establish 2-3 goals that they will plan to diligently work on until they return for the  78-monthfollow-up visit  Goals:   I will count my carb choices at most meals and snacks  I will be active 10 minutes or more 4 times a week  I will take my diabetes medications as scheduled  I will test my glucose at least 4 times a day, 7 days a week  Your patient has identified these potential barriers to change:  Back pain  Your patient has identified their diabetes self-care support plan as  Family Support On-line Resources Link to wellness Program  Plan: Follow up with [facility] to [email or web address]

## 2014-03-14 ENCOUNTER — Telehealth: Payer: Self-pay | Admitting: Nutrition

## 2014-03-14 NOTE — Telephone Encounter (Signed)
Patient reports another sever low blood sugar requiring assistance: Date     acB   2hr. pcB     acL    2hr. pcL   acS     HS          MN 1/14                                  158      88          137        84 1/15       92                       154                                               34 1/16      106    139            59                       92 1/17     133     227            167                    175        173 1/18     118                                     179(4PM) 1/19      91                      110     168                         196   1/20      92      129     59   93    Taking 110u Toujeo, Humalog(200/ml): 50u acB (30u if active), 30uACL, 50u acS. He was told to take 20u acL, but says he does not remember this.

## 2014-03-15 ENCOUNTER — Other Ambulatory Visit: Payer: Self-pay | Admitting: *Deleted

## 2014-03-15 ENCOUNTER — Encounter: Payer: Self-pay | Admitting: Endocrinology

## 2014-03-15 ENCOUNTER — Telehealth: Payer: Self-pay | Admitting: Endocrinology

## 2014-03-15 ENCOUNTER — Ambulatory Visit (INDEPENDENT_AMBULATORY_CARE_PROVIDER_SITE_OTHER): Payer: 59 | Admitting: Endocrinology

## 2014-03-15 VITALS — BP 132/60 | HR 83 | Temp 98.0°F | Resp 12 | Wt 222.0 lb

## 2014-03-15 DIAGNOSIS — IMO0002 Reserved for concepts with insufficient information to code with codable children: Secondary | ICD-10-CM

## 2014-03-15 DIAGNOSIS — E785 Hyperlipidemia, unspecified: Secondary | ICD-10-CM

## 2014-03-15 DIAGNOSIS — E1165 Type 2 diabetes mellitus with hyperglycemia: Secondary | ICD-10-CM

## 2014-03-15 MED ORDER — INSULIN PEN NEEDLE 32G X 4 MM MISC: Status: DC

## 2014-03-15 NOTE — Progress Notes (Signed)
Patient ID: Arthur Mata, male   DOB: May 09, 1955, 59 y.o.   MRN: [id number]           Reason for Appointment:  Follow-up for Type 2 Diabetes  Referring physician: Girtha Rm  History of Present Illness:          Diagnosis: Type 2 diabetes mellitus, date of diagnosis: 12/2011      Past history:  At the time of diagnosis he had symptoms of frequent urination, thirst and blurred vision He thinks his blood sugar was about 600.  His physician started him on Novolog and also Metformin  Details of initial treatment are not available but he also apparently took Victoza at some point before insulin and he does not think it worked After some time he was also given Levemir in addition to his NovoLog His blood sugars were somewhat better controlled after some time and were averaging about 200. He thinks that because of diarrhea his metformin was stopped after a few months.  Apparently in 10/2012 he had epidural steroid and with this his blood sugar went up to 600.   He had continued to have low back pain also has not had  repeat epidural steroid injections However he thinks his blood sugars had been persistently poorly controlled since then. He has been under the care of an endocrinologist in [place]. Actos was started in 06/2012 without much benefit. He was also tried on Invokana for about 3 months and reportedly had no benefit from this. He was started on U-500 instead of Levemir and NovoLog in late 2014  Recent history:  INSULIN regimen is described as:  Toujeo 110 units in a.m., Humalog U-200 = 50-30-50 AC  His blood sugars had been poorly controlled with A1c ranging from 10.5-12.5 and recently 11.5 prior to his initial consultation. Also was symptomatic with his hyperglycemia with thirst, weight loss and fatigue and blood sugars sometimes over 400 He also had been recommended an insulin pump but he did not want to do this because of the cost and difficulty using it. On his initial consultation he  was switched from the U-500 insulin to a combination of Toujeo 120 units and Humalog U-200 70-40-70 before meals He has seen the dietitian and has been trying to usually get low carbohydrate low fat meals.  Soon after he switched to the new regimen his blood sugars started coming down significantly especially during the day and was starting to get hypoglycemia. He will be getting hypoglycemic mostly with increased physical activity such as working around the house remodeling. He was advised to cut back on his Humalog insulin especially when he was planning to be more active However he is still getting tendency to sporadic hypoglycemia mostly around midday and once at midnight, lowest glucose 34 He did have difficulty treating his hypoglycemia on his own on one occasion However his fasting blood sugars have been fairly stable and since they were low normal his Toujeo was reduced to 110 units Postprandial glucose: This appears to be high mostly after breakfast even though he is eating a balanced meals with protein Hypoglycemia: As above  Oral hypoglycemic drugs the patient is taking are: Actos 45 mg      Side effects from medications have been: Diarrhea from metformin Compliance with the medical regimen: Good   Glucose monitoring:  done 2-4 times a day         Glucometer: Contour next      Blood Glucose readings by download  PRE-MEAL Breakfast Lunch  Dinner  PCS  Overall  Glucose range:  88-138   39-167   59-168     Mean/median:  108      130    POST-MEAL PC Breakfast PC Lunch PC Dinner  Glucose range:  219-228   88, 189   91-214   Mean/median:      Self-care: The diet that the patient has been following is: tries to limit .     Meals: 3 meals per day. Breakfast is usually egg and toast, lunch is usually a meat and crackers, evening meal usually chicken and vegetables.  Snacks: Popcorn, orange or apple           Exercise:  walking up to 30 minutes, about 3 times a week.          Dietician  visit, most recent: 11/2012 .               Weight history: 202-236  Wt Readings from Last 3 Encounters:  03/15/14 222 lb (100.699 kg)  03/12/14 216 lb (97.977 kg)  03/13/14 218 lb 14.4 oz (99.292 kg)   Glycemic control:   Lab Results  Component Value Date   HGBA1C 11.5* 01/31/2014   No results found for: GLUF, MICROALBUR, [place], CREATININE       Medication List       This list is accurate as of: 03/15/14 11:59 PM.  Always use your most recent med list.               alprazolam 2 MG tablet  Commonly known as:  XANAX     aspirin 81 MG tablet  Take 81 mg by mouth daily.     atorvastatin 40 MG tablet  Commonly known as:  LIPITOR     buPROPion 150 MG 12 hr tablet  Commonly known as:  WELLBUTRIN SR     gabapentin 600 MG tablet  Commonly known as:  NEURONTIN     glucose blood test strip  Commonly known as:  BAYER CONTOUR NEXT TEST  Use to check blood sugar 5 times per day dx code E11.65     HUMULIN R 500 UNIT/ML Soln injection  Generic drug:  insulin regular human CONCENTRATED     Insulin Glargine 300 UNIT/ML Sopn  Commonly known as:  TOUJEO SOLOSTAR  Inject 120 Units into the skin daily before breakfast.     Insulin Lispro (Human) 200 UNIT/ML Sopn  Commonly known as:  HUMALOG KWIKPEN  Inject 40-70 Units into the skin 3 (three) times daily before meals.     Insulin Pen Needle 32G X 4 MM Misc  Commonly known as:  BD PEN NEEDLE NANO U/F  Use to inject insulin 4 times daily as instructed.     LANOXIN 0.25 MG tablet  Generic drug:  digoxin  Take 0.25 mg by mouth daily.     lisinopril 5 MG tablet  Commonly known as:  PRINIVIL,ZESTRIL     nitroGLYCERIN 0.4 MG SL tablet  Commonly known as:  NITROSTAT  Place 1 tablet (0.4 mg total) under the tongue every 5 (five) minutes as needed for chest pain.     pioglitazone 45 MG tablet  Commonly known as:  ACTOS  Take 45 mg by mouth daily.        Allergies:  Allergies  Allergen Reactions  . Prednisone      Patient allergic to steroids    Past Medical History  Diagnosis Date  . Diabetes mellitus without complication   . Angina  pectoris   . Palpitations   . Chest pain   . Hyperlipidemia   . Hypertension   . Near syncope   . Bone spur of left foot   . Obesity   . Mumps   . Measles     Past Surgical History  Procedure Laterality Date  . Spine surgery  03/21/13    Lumbar fusion  . Anterior cervical discectomy      Family History  Problem Relation Age of Onset  . Diabetes Mother   . Diabetes Father   . Heart disease Father   . Diabetes Brother   . Hyperlipidemia Other   . Hypertension Other     Social History:  reports that he quit smoking about a year ago. He has never used smokeless tobacco. His alcohol and drug histories are not on file.    Review of Systems       Vision is normal. Most recent eye exam was in 2012       Lipids: In 12/15 showed LDL 69, HDL 24 and triglycerides 168.  Has been on Lipitor for quite some time      No results found for: CHOL, HDL, LDLCALC, LDLDIRECT, TRIG, CHOLHDL                      He has a history of left leg  Numbness, tingling and burning and also some in his left foot.  Taking gabapentin high doses for this. Foot exam in 12/15 showed the following.Diabetic foot exam shows significantly decreased monofilament sensation in the toes especially left fifth toe, no skin lesions or ulcers on the feet and normal pulses  LABS:  No visits with results within 1 Week(s) from this visit. Latest known visit with results is:  Office Visit on 02/14/2014  Component Date Value Ref Range Status  . Hgb A1c MFr Bld 01/31/2014 11.5* 4.0 - 6.0 % Final    Physical Examination:  BP 132/60 mmHg  Pulse 83  Temp(Src) 98 F (36.7 C) (Oral)  Resp 12  Wt 222 lb (100.699 kg)  SpO2 96%        ASSESSMENT:  Diabetes type 2, uncontrolled   The patient appears to have responded dramatically to switching from U-500 to a combination of U-200 mealtime  coverage as well as Toujeo Still requiring large doses of insulin, as much as 130 units of mealtime coverage along with 110 units of basal at this time See history of present illness for detailed description of his daily regimen, problems identified in blood sugar patterns  Currently he is having difficulties with hypoglycemia with increased physical activity especially at mid day and occasionally at supper and bedtime Also at the same time may tend to have some postprandial hyperglycemia especially after breakfast and occasionally after supper However he is symptomatically much better Overall compliance with diet and exercise regimen is excellent Currently still on Actos which appears to be has not helped.  PLAN:   He will try to use Toujeo at night to see if it will reduce to tendency to hypoglycemia midday and reduced the dose also  Given him a flow sheet with instructions on how to titrate the Toujeo to get morning sugars in the 90-130 range consistently  Reduce coverage at lunchtime  Continue to reduce mealtime dose at least 10-20 units for increased physical activity  Try to take at least breakfast insulin 15 minutes before eating consistently  May need to increase his mealtime coverage at breakfast  if he has persistent postprandial hyperglycemia  A1c on the next visit in 4 weeks  Continue Actos for now  Patient Instructions  Toujeo 95 units and adjust every 3-4 days to keep am sugar in 90-130 range  Try taking at 8-9 pm daily  Humalog 25 at lunch  Reduce 10-20 for heavy activity   Counseling time over 50% of today's 25 minute visit   Arthur Mata 03/16/2014, 10:23 AM   Note: This office note was prepared with Estate agent. Any transcriptional errors that result from this process are unintentional.

## 2014-03-15 NOTE — Telephone Encounter (Signed)
Patient states he forgot to mention he would like his pen needles called in   Pharmacy: [facility]     Thank you

## 2014-03-15 NOTE — Patient Instructions (Signed)
Toujeo 95 units and adjust every 3-4 days to keep am sugar in 90-130 range  Try taking at 8-9 pm daily  Humalog 25 at lunch  Reduce 10-20 for heavy activity

## 2014-03-15 NOTE — Telephone Encounter (Signed)
Done

## 2014-03-16 ENCOUNTER — Ambulatory Visit: Payer: 59 | Admitting: Endocrinology

## 2014-04-02 ENCOUNTER — Ambulatory Visit: Payer: 59 | Admitting: Cardiology

## 2014-04-09 LAB — HM DIABETES EYE EXAM

## 2014-04-13 ENCOUNTER — Ambulatory Visit (INDEPENDENT_AMBULATORY_CARE_PROVIDER_SITE_OTHER): Payer: 59 | Admitting: Cardiology

## 2014-04-13 ENCOUNTER — Encounter: Payer: Self-pay | Admitting: Cardiology

## 2014-04-13 VITALS — BP 128/60 | HR 62 | Ht 70.0 in | Wt 230.0 lb

## 2014-04-13 DIAGNOSIS — R079 Chest pain, unspecified: Secondary | ICD-10-CM

## 2014-04-13 DIAGNOSIS — R002 Palpitations: Secondary | ICD-10-CM

## 2014-04-13 DIAGNOSIS — E785 Hyperlipidemia, unspecified: Secondary | ICD-10-CM

## 2014-04-13 NOTE — Progress Notes (Signed)
Cardiology Office Note   Date:  04/13/2014   ID:  KIKO RIPP, DOB 02-15-1956, MRN [id number]  PCP:  Cecilie Lowers, MD  Cardiologist:   Darlin Coco, MD   No chief complaint on file.     History of Present Illness: Arthur Mata is a 59 y.o. male who presents for a one-month follow-up office visit  This pleasant 59 year old gentleman is seen by me for the first time a month ago.Marland Kitchen He is a medical patient of Dr. Hali Marry in [place]. The patient is now reestablishing care in the [place] area. His wife works for the [employer] system as a Building control surveyor. The patient is being seen because of pressure no chest discomfort. He has multiple risk factors for ischemic heart disease. He had a heart catheterization in 2005 which showed only mild plaque. Had a second heart catheterization in 2014 in [facility] which showed nonobstructive coronary artery disease and an ejection fraction of 65%. The patient has a history of paroxysmal supraventricular tachycardia and is on digoxin 0.25 mg daily the patient had had prediabetes for about 10 years and 2 years ago became a full blown diabetic. His most recent hemoglobin A1c is 11.5. He is now being followed for his diabetes here in [place] by Dr. Dwyane Dee. The patient drinks 2-3 cups of caffeinated coffee in the morning. He does not consume any alcohol. He continues to smoke a half a pack of cigarettes a day against advice. He has a history of chronic back pain and is on permanent medical disability because of his back. His family history reveals that his mother died of a heart attack at age 39 and his father died of a massive heart attack at age 27. He has a brother with atrial fibrillation and a sister who has a pacemaker. Overall maternal uncles who have died from heart attacks.  Since last visit he had a treadmill Myoview stress test on 03/13/14 which showed no ischemia and his ejection  fraction was 63%. Since last visit he has been doing well.  He's had no further chest discomfort of a major nature.  He does have some intermittent tingling of his left arm and fingers which may be secondary to cervical disc disease and he has an appointment pending to see a neurosurgeon in [place] whom he has seen before. At his last visit we ordered a chest x-ray but he never got it.  We reminded him of the importance of the chest x-ray since he is a smoker.  Past Medical History  Diagnosis Date  . Diabetes mellitus without complication   . Angina pectoris   . Palpitations   . Chest pain   . Hyperlipidemia   . Hypertension   . Near syncope   . Bone spur of left foot   . Obesity   . Mumps   . Measles     Past Surgical History  Procedure Laterality Date  . Spine surgery  03/21/13    Lumbar fusion  . Anterior cervical discectomy       Current Outpatient Prescriptions  Medication Sig Dispense Refill  . alprazolam (XANAX) 2 MG tablet Take 2 mg by mouth as needed.   5  . aspirin 81 MG tablet Take 81 mg by mouth daily.    Marland Kitchen atorvastatin (LIPITOR) 40 MG tablet Take 40 mg by mouth daily.   3  . buPROPion (WELLBUTRIN SR) 150 MG 12 hr tablet Take 150 mg by  mouth daily.   10  . digoxin (LANOXIN) 0.25 MG tablet Take 0.25 mg by mouth daily.    Marland Kitchen gabapentin (NEURONTIN) 600 MG tablet Take 600 mg by mouth 3 (three) times daily.   3  . glucose blood (BAYER CONTOUR NEXT TEST) test strip Use to check blood sugar 5 times per day dx code E11.65 150 each 3  . HUMULIN R 500 UNIT/ML SOLN injection   3  . Insulin Glargine (TOUJEO SOLOSTAR) 300 UNIT/ML SOPN Inject 120 Units into the skin daily before breakfast. 6 pen 1  . Insulin Lispro, Human, (HUMALOG KWIKPEN) 200 UNIT/ML SOPN Inject 40-70 Units into the skin 3 (three) times daily before meals. (Patient taking differently: Inject 40-70 Units into the skin 3 (three) times daily before meals. 70-40-70 with meals) 6 pen 3  . Insulin Pen Needle (BD PEN  NEEDLE NANO U/F) 32G X 4 MM MISC Use to inject insulin 4 times daily as instructed. 150 each 5  . lisinopril (PRINIVIL,ZESTRIL) 5 MG tablet Take 5 mg by mouth daily.   3  . nitroGLYCERIN (NITROSTAT) 0.4 MG SL tablet Place 1 tablet (0.4 mg total) under the tongue every 5 (five) minutes as needed for chest pain. 90 tablet 3  . pioglitazone (ACTOS) 45 MG tablet Take 45 mg by mouth daily.     No current facility-administered medications for this visit.    Allergies:   Prednisone    Social History:  The patient  reports that he quit smoking about 12 months ago. He has never used smokeless tobacco.   Family History:  The patient's family history includes Diabetes in his brother, father, and mother; Heart disease in his father; Hyperlipidemia in his other; Hypertension in his other.    ROS:  Please see the history of present illness.   Otherwise, review of systems are positive for none.   All other systems are reviewed and negative.    PHYSICAL EXAM: VS:  BP 128/60 mmHg  Pulse 62  Ht 5\' 10"  (1.778 m)  Wt 230 lb (104.327 kg)  BMI 33.00 kg/m2 , BMI Body mass index is 33 kg/(m^2). GEN: Well nourished, well developed, in no acute distress HEENT: normal Neck: no JVD, carotid bruits, or masses Cardiac: RRR; no murmurs, rubs, or gallops,no edema  Respiratory:  clear to auscultation bilaterally, normal work of breathing GI: soft, nontender, nondistended, + BS MS: no deformity or atrophy Skin: warm and dry, no rash Neuro:  Strength and sensation are intact Psych: euthymic mood, full affect   EKG:  EKG is not ordered today.    Recent Labs: No results found for requested labs within last 365 days.    Lipid Panel No results found for: CHOL, TRIG, HDL, CHOLHDL, VLDL, LDLCALC, LDLDIRECT    Wt Readings from Last 3 Encounters:  04/13/14 230 lb (104.327 kg)  03/15/14 222 lb (100.699 kg)  03/12/14 216 lb (97.977 kg)        ASSESSMENT AND PLAN:  1. Exertional chest discomfort.   Recent normal Myoview stress test on 03/13/14 with ejection fraction of 63% 2. Multiple risk factors for premature coronary artery disease including strong family history, dyslipidemia, poorly controlled diabetes with hemoglobin A1c of 11.5, and ongoing smoking 3. History of paroxysmal tachycardia controlled on digoxin 4. On medical disability since April 2015 because of chronic back pain. History of major back surgery in January 2015.  Disposition: Continue current medication.  Get chest x-ray.  Talk to him again about quitting smoking.  He has  cut down. He is scheduled for cataract surgery next month and should be okay for that. Recheck in 6 months for follow-up office visit and EKG   Current medicines are reviewed at length with the patient today.  The patient does not have concerns regarding medicines.  The following changes have been made:  no change   No orders of the defined types were placed in this encounter.     Disposition:   FU with Dr. Mare Ferrari in 6 months for office visit and EKG   Signed, Darlin Coco, MD  04/13/2014 1:08 PM    [facility] [address], [place], [place]  [address] Phone: [phone]; Fax: [phone]

## 2014-04-13 NOTE — Patient Instructions (Signed)
GET CHEST XRAY AS PREVIOUSLY RECOMMENDED   Your physician recommends that you continue on your current medications as directed. Please refer to the Current Medication list given to you today.  Your physician wants you to follow-up in: [place] will receive a reminder letter in the mail two months in advance. If you don't receive a letter, please call our office to schedule the follow-up appointment.

## 2014-04-23 ENCOUNTER — Other Ambulatory Visit: Payer: 59

## 2014-04-24 ENCOUNTER — Ambulatory Visit: Payer: 59 | Admitting: Endocrinology

## 2014-05-09 ENCOUNTER — Ambulatory Visit (INDEPENDENT_AMBULATORY_CARE_PROVIDER_SITE_OTHER): Payer: Self-pay | Admitting: Family Medicine

## 2014-05-09 VITALS — BP 139/63 | HR 63 | Ht 71.0 in | Wt 224.4 lb

## 2014-05-09 DIAGNOSIS — E1136 Type 2 diabetes mellitus with diabetic cataract: Secondary | ICD-10-CM

## 2014-05-09 NOTE — Progress Notes (Signed)
Patient presents today for DM follow-up as part of employer-sponsored Link to [employer]. Medications, glucose readings, a1c and compliance have been reviewed. I have also discussed with patient lifestyle interventions including diet and exercise. Details of the visit can be found in [employer] documenting program through [facility]). Patient has set a series of personal goals and will follow-up in no more than 3 months for further review of DM.  A: Patient has had very significant blood glucose control improvement under the recent new care of Dr. Dwyane Dee. Patient reports having some disagreement with the quality of care provided at other locations, but is glad to have Dr. Dwyane Dee managing his diabetes. His significant success has brought him and his wife some comfort and confidence. Heavy focus today on continuing portion control and increasing exercise as allowed by back. Also a discussion and preparation for smoking cessation. P: 1) Bring meter to visits 2) Keep up exercise as allowed by back 3) Continue with good portion control and food selections 4) Set a quit date and kick the nicotine for good  Today's A1c (7.0%) a very significant improvement over previous (11.5% on 01/31/14).

## 2014-05-16 NOTE — Progress Notes (Signed)
ATTENDING PHYSICIAN NOTE: I have reviewed the chart and agree with the plan as detailed above. Maymunah Stegemann MD Pager [phone]  

## 2014-05-22 ENCOUNTER — Other Ambulatory Visit (INDEPENDENT_AMBULATORY_CARE_PROVIDER_SITE_OTHER): Payer: 59

## 2014-05-22 DIAGNOSIS — IMO0002 Reserved for concepts with insufficient information to code with codable children: Secondary | ICD-10-CM

## 2014-05-22 DIAGNOSIS — E1165 Type 2 diabetes mellitus with hyperglycemia: Secondary | ICD-10-CM

## 2014-05-22 LAB — BASIC METABOLIC PANEL
BUN: 17 mg/dL (ref 6–23)
CHLORIDE: 105 meq/L (ref 96–112)
CO2: 27 mEq/L (ref 19–32)
Calcium: 9.6 mg/dL (ref 8.4–10.5)
Creatinine, Ser: 1.07 mg/dL (ref 0.40–1.50)
GFR: 75.65 mL/min (ref >60.00)
GLUCOSE: 144 mg/dL — AB (ref 70–99)
POTASSIUM: 4 meq/L (ref 3.5–5.1)
SODIUM: 137 meq/L (ref 135–145)

## 2014-05-22 LAB — HEMOGLOBIN A1C: Hgb A1c MFr Bld: 7.2 % — ABNORMAL HIGH (ref 4.6–6.5)

## 2014-05-22 LAB — MICROALBUMIN / CREATININE URINE RATIO
CREATININE, U: 229.7 mg/dL
MICROALB/CREAT RATIO: 1.1 mg/g (ref 0.0–30.0)
Microalb, Ur: 2.6 mg/dL — ABNORMAL HIGH (ref 0.0–1.9)

## 2014-05-25 ENCOUNTER — Encounter: Payer: Self-pay | Admitting: Endocrinology

## 2014-05-25 ENCOUNTER — Ambulatory Visit (INDEPENDENT_AMBULATORY_CARE_PROVIDER_SITE_OTHER): Payer: 59 | Admitting: Endocrinology

## 2014-05-25 VITALS — BP 126/68 | HR 55 | Temp 98.2°F | Resp 14 | Ht 71.0 in | Wt 227.4 lb

## 2014-05-25 DIAGNOSIS — E1165 Type 2 diabetes mellitus with hyperglycemia: Secondary | ICD-10-CM | POA: Diagnosis not present

## 2014-05-25 DIAGNOSIS — IMO0002 Reserved for concepts with insufficient information to code with codable children: Secondary | ICD-10-CM

## 2014-05-25 NOTE — Progress Notes (Signed)
Patient ID: Arthur Mata, male   DOB: 06-07-1955, 59 y.o.   MRN: [id number]           Reason for Appointment:  Follow-up for Type 2 Diabetes  Referring physician: Girtha Rm  History of Present Illness:          Diagnosis: Type 2 diabetes mellitus, date of diagnosis: 12/2011      Past history:  At the time of diagnosis he had symptoms of frequent urination, thirst and blurred vision He thinks his blood sugar was about 600.  His physician started him on Novolog and also Metformin  Details of initial treatment are not available but he also apparently took Victoza at some point before insulin and he does not think it worked After some time he was also given Levemir in addition to his NovoLog His blood sugars were somewhat better controlled after some time and were averaging about 200. He thinks that because of diarrhea his metformin was stopped after a few months.  Apparently in 10/2012 he had epidural steroid and with this his blood sugar went up to 600.   He had continued to have low back pain also has not had  repeat epidural steroid injections However he thinks his blood sugars had been persistently poorly controlled since then. He has been under the care of an endocrinologist in [place]. Actos was started in 06/2012 without much benefit. He was also tried on Invokana for about 3 months and reportedly had no benefit from this. He was started on U-500 instead of Levemir and NovoLog in late 2014 His blood sugars had been poorly controlled with A1c ranging from 10.5-12.5 and recently 11.5 prior to his initial consultation.  Recent history:  INSULIN regimen is described as:  Toujeo 95 units in a.m., Humalog U-200 = 50-30-50 AC  . On his initial consultation he was switched from the U-500 insulin to a combination of Toujeo 120 units and Humalog U-200 70-40-70 before meals He has seen the dietitian and has been trying to usually get low carbohydrate low fat meals. Soon after he switched to  the new regimen his blood sugars started coming down significantly  On his follow-up visit in 1/16 because of tendency to hypoglycemia during the day especially with increased activity he was told to take his Toujeo at night instead of the morning. Also he was asked to consistently reduce his Humalog before planning any activities especially after breakfast His A1c appears to be excellent now and he subjectively feeling very well  Toujeo dose was reduced to 95 from the initial starting dose of 120 However his fasting blood sugars have been slightly high overall and only occasionally below 140 He has not done readings after his meals except after breakfast and these are somewhat variable Not clear which readings are after meals as he does not label them He does still tending to have some high readings after breakfast despite taking his morning insulin at least 30 minutes before eating Postprandial glucose: This appears to be high occasionally after breakfast even though he is eating a balanced meals with protein Hypoglycemia: None documented are recently but he feels a little hypoglycemic when blood sugars are low normal, usually occurring after exercise  Oral hypoglycemic drugs the patient is taking are: Actos 45 mg      Side effects from medications have been: Diarrhea from metformin Compliance with the medical regimen: Good   Glucose monitoring:  done 2 times a day  Glucometer: Contour next      Blood Glucose readings by download  PRE-MEAL Breakfast Lunch Dinner Bedtime Overall  Glucose range:  105-196       Mean/median:  150     149   POST-MEAL PC Breakfast PC Lunch PC Dinner  Glucose range:  89-223   165    Mean/median:        Self-care: The diet that the patient has been following is: tries to limit .     Meals: 3 meals per day. Breakfast is usually egg and toast, lunch is usually a meat and crackers, evening meal usually chicken and vegetables.  Snacks: Popcorn, orange or  apple           Exercise:  walking up to 30 minutes, about 3 times a week.          Dietician visit, most recent: 11/2012 .               Weight history: 202-236  Wt Readings from Last 3 Encounters:  05/25/14 227 lb 6.4 oz (103.148 kg)  05/09/14 224 lb 6.4 oz (101.787 kg)  04/13/14 230 lb (104.327 kg)   Glycemic control:   Lab Results  Component Value Date   HGBA1C 7.2* 05/22/2014   HGBA1C 11.5* 01/31/2014   Lab Results  Component Value Date   MICROALBUR 2.6* 05/22/2014   CREATININE 1.07 05/22/2014         Medication List       This list is accurate as of: 05/25/14 11:59 PM.  Always use your most recent med list.               alprazolam 2 MG tablet  Commonly known as:  XANAX  Take 2 mg by mouth 3 (three) times daily as needed.     aspirin 81 MG tablet  Take 81 mg by mouth daily.     atorvastatin 40 MG tablet  Commonly known as:  LIPITOR  Take 40 mg by mouth daily.     buPROPion 150 MG 12 hr tablet  Commonly known as:  WELLBUTRIN SR  Take 150 mg by mouth 2 (two) times daily.     furosemide 20 MG tablet  Commonly known as:  LASIX  Take 20 mg by mouth every morning. Can take 1 or 2 tablets PRN ankle swelling     gabapentin 600 MG tablet  Commonly known as:  NEURONTIN  Take 600 mg by mouth 3 (three) times daily.     glucose blood test strip  Commonly known as:  BAYER CONTOUR NEXT TEST  Use to check blood sugar 5 times per day dx code E11.65     Insulin Glargine 300 UNIT/ML Sopn  Commonly known as:  TOUJEO SOLOSTAR  Inject 120 Units into the skin daily before breakfast.     Insulin Lispro (Human) 200 UNIT/ML Sopn  Commonly known as:  HUMALOG KWIKPEN  Inject 40-70 Units into the skin 3 (three) times daily before meals.     Insulin Pen Needle 32G X 4 MM Misc  Commonly known as:  BD PEN NEEDLE NANO U/F  Use to inject insulin 4 times daily as instructed.     LANOXIN 0.25 MG tablet  Generic drug:  digoxin  Take 0.25 mg by mouth daily.      lisinopril 5 MG tablet  Commonly known as:  PRINIVIL,ZESTRIL  Take 5 mg by mouth daily.     metoprolol succinate 25 MG 24 hr tablet  Commonly known  as:  TOPROL-XL  Take 25 mg by mouth daily.     nitroGLYCERIN 0.4 MG SL tablet  Commonly known as:  NITROSTAT  Place 1 tablet (0.4 mg total) under the tongue every 5 (five) minutes as needed for chest pain.     pioglitazone 45 MG tablet  Commonly known as:  ACTOS  Take 45 mg by mouth daily.        Allergies:  Allergies  Allergen Reactions  . Prednisone     Patient allergic to steroids    Past Medical History  Diagnosis Date  . Diabetes mellitus without complication   . Angina pectoris   . Palpitations   . Chest pain   . Hyperlipidemia   . Hypertension   . Near syncope   . Bone spur of left foot   . Obesity   . Mumps   . Measles     Past Surgical History  Procedure Laterality Date  . Spine surgery  03/21/13    Lumbar fusion  . Anterior cervical discectomy      Family History  Problem Relation Age of Onset  . Diabetes Mother   . Diabetes Father   . Heart disease Father   . Diabetes Brother   . Hyperlipidemia Other   . Hypertension Other     Social History:  reports that he has been smoking Cigarettes.  He started smoking about 3 months ago. He has been smoking about 0.25 packs per day. He has never used smokeless tobacco. His alcohol and drug histories are not on file.    Review of Systems   Most recent eye exam was in 2012       Lipids: In 12/15 showed LDL 69, HDL 24 and triglycerides 168.  Has been on Lipitor for quite some time      No results found for: CHOL, HDL, LDLCALC, LDLDIRECT, TRIG, CHOLHDL                      He has a history of left leg  Numbness, tingling and burning and also some in his left foot.  Taking gabapentin high doses for this.  Foot exam in 12/15 showed the following.Diabetic foot exam shows significantly decreased monofilament sensation in the toes especially left fifth toe,  no skin lesions or ulcers on the feet and normal pulses  LABS:  Lab on 05/22/2014  Component Date Value Ref Range Status  . Hgb A1c MFr Bld 05/22/2014 7.2* 4.6 - 6.5 % Final   Glycemic Control Guidelines for People with Diabetes:Non Diabetic:  <6%Goal of Therapy: <7%Additional Action Suggested:  >8%   . Sodium 05/22/2014 137  135 - 145 mEq/L Final  . Potassium 05/22/2014 4.0  3.5 - 5.1 mEq/L Final  . Chloride 05/22/2014 105  96 - 112 mEq/L Final  . CO2 05/22/2014 27  19 - 32 mEq/L Final  . Glucose, Bld 05/22/2014 144* 70 - 99 mg/dL Final  . BUN 05/22/2014 17  6 - 23 mg/dL Final  . Creatinine, Ser 05/22/2014 1.07  0.40 - 1.50 mg/dL Final  . Calcium 05/22/2014 9.6  8.4 - 10.5 mg/dL Final  . GFR 05/22/2014 75.65  >60.00 mL/min Final  . Microalb, Ur 05/22/2014 2.6* 0.0 - 1.9 mg/dL Final  . Creatinine,U 05/22/2014 229.7   Final  . Microalb Creat Ratio 05/22/2014 1.1  0.0 - 30.0 mg/g Final    Physical Examination:  BP 126/68 mmHg  Pulse 55  Temp(Src) 98.2 F (36.8 C)  Resp  14  Ht 5\' 11"  (1.803 m)  Wt 227 lb 6.4 oz (103.148 kg)  BMI 31.73 kg/m2  SpO2 97%       no pedal edema  ASSESSMENT:  Diabetes type 2, uncontrolled  See history of present illness for detailed description of his daily regimen, problems identified in blood sugar patterns His A1c is now excellent near 7% The patient is still having excellent control with using Toujeo as his basal insulin along with mealtime insulin Still requiring large doses of insulin, as much as 130 units of mealtime coverage along with 95 units of basal at this time He is concerned about his weight and difficulty losing it  His main difficulties are tendency to occasional hypoglycemia with increased exercise but is able to manage this better with reducing his mealtime coverage when planning activities His readings after breakfast are somewhat inconsistent and sometimes still high Discussed that he has not checked readings after lunch and  supper recently and not clear if his mealtime doses need to be adjusted further Overall compliance with diet and exercise regimen is excellent Currently still on Actos and not clear if this is benefiting, has been on this for quite some time  PLAN:   He will try to increase his Toujeo by 5 units for now especially if sugars are not consistently high after evening meal  Discussed when to check his blood sugars and blood sugar targets especially after meals; should try to adjust his meal time dose based on size of meal especially at lunch and supper  Continue to reduce mealtime insulin to about 50% when planning to be very active  He can stop Actos for about a month and if blood sugars did not change significantly continue to leave it off  Balanced meals  Follow-up in 3 months again for review  Patient Instructions  Check regularly after lunch and supper  If sugar not high after supper go to 100 Toujeo  Stop Pioglitazone to see if weght better   Counseling time over 50% of today's 25 minute visit   Arthur Mata 05/26/2014, 5:37 PM   Note: This office note was prepared with Dragon voice recognition system technology. Any transcriptional errors that result from this process are unintentional.

## 2014-05-25 NOTE — Patient Instructions (Addendum)
Check regularly after lunch and supper  If sugar not high after supper go to 100 Toujeo  Stop Pioglitazone to see if weght better

## 2014-05-28 NOTE — Progress Notes (Signed)
Patient ID: Arthur Mata, male   DOB: [birth year], 59 y.o.   MRN: [id number] Reviewed: Agree with the documentation and management of our [place].

## 2014-07-10 ENCOUNTER — Ambulatory Visit (INDEPENDENT_AMBULATORY_CARE_PROVIDER_SITE_OTHER): Payer: Self-pay | Admitting: Family Medicine

## 2014-07-10 ENCOUNTER — Ambulatory Visit: Payer: Self-pay | Admitting: Pharmacist

## 2014-07-10 VITALS — BP 127/67 | HR 51 | Ht 71.0 in | Wt 229.0 lb

## 2014-07-10 DIAGNOSIS — E1165 Type 2 diabetes mellitus with hyperglycemia: Secondary | ICD-10-CM

## 2014-07-10 DIAGNOSIS — IMO0002 Reserved for concepts with insufficient information to code with codable children: Secondary | ICD-10-CM

## 2014-07-10 NOTE — Patient Instructions (Signed)
1. Add exercise, especially core strengthening exercises with the intent of building some muscle to help support your back. Most importantly, do not do anything that aggrevates your back and DO make selections that make you sweat! 2. Continue eating well. Attempt to find any "trap" foods by inspecting the nutrition labels. 3. Call Dr. Dwyane Dee if blood glucoses remain slightly elevated throughout the day. He may want to consider increasing your mealtime insulins (your basal looks good based on your AM readings).

## 2014-07-10 NOTE — Progress Notes (Signed)
Subjective:  Patient presents today for 3 month diabetes follow-up as part of the employer-sponsored Link to Well[employer] program.  Current diabetes regimen includes  Toujeo 95 Units at bedtime, Humalog 50-30-50 Units with meals (adjusted if he is going to do a lot of work); Actos has been discontinued at least temporarily in hopes of addressing weight gain.  Patient also continues on daily ASA 81, ACE Inhibitor and statin.  Most recent MD follow-up was 05/22/14.  Patient has a pending appt for 08/20/14.  No med changes or major health changes at this time.    Assessment/Plan:  Patient is a 59 yo male with DM 2. Most recent A1C was  7.2% which is at goal (for now) of less than 7.5%. Weight is increased from last visit with me.  Actos has been discontinued and patient has been on steroid eye drops for several weeks. Lifestyle improvements:  Physical Activity- Add at least 150 minutes of exercise weekly. Time spent sweating. Try to choose selections that will not hinder or worsen your back issues.   Nutrition-  Continue to eat well and try to identify where you are eating all of your calories.  Follow up with me in 3 months.    Goals for Next Visit:  1. Add exercise, especially core strengthening exercises with the intent of building some muscle to help support your back. Most importantly, do not do anything that aggrevates your back and DO make selections that make you sweat! 2. Continue eating well. Attempt to find any "trap" foods by inspecting the nutrition labels. 3. Call Dr. Dwyane Dee if blood glucoses remain slightly elevated throughout the day. He may want to consider increasing your mealtime insulins (your basal looks good based on your AM readings).    Next appointment to see me is:

## 2014-07-19 NOTE — Progress Notes (Signed)
ATTENDING PHYSICIAN NOTE: I have reviewed the chart and agree with the plan as detailed above. Sharmeka Palmisano MD Pager [phone]  

## 2014-08-20 ENCOUNTER — Encounter: Payer: Self-pay | Admitting: Endocrinology

## 2014-08-20 ENCOUNTER — Ambulatory Visit (INDEPENDENT_AMBULATORY_CARE_PROVIDER_SITE_OTHER): Payer: 59 | Admitting: Endocrinology

## 2014-08-20 VITALS — BP 128/68 | HR 61 | Temp 98.0°F | Resp 16 | Ht 71.0 in | Wt 224.6 lb

## 2014-08-20 DIAGNOSIS — IMO0002 Reserved for concepts with insufficient information to code with codable children: Secondary | ICD-10-CM

## 2014-08-20 DIAGNOSIS — E1165 Type 2 diabetes mellitus with hyperglycemia: Secondary | ICD-10-CM

## 2014-08-20 DIAGNOSIS — E785 Hyperlipidemia, unspecified: Secondary | ICD-10-CM | POA: Diagnosis not present

## 2014-08-20 LAB — BASIC METABOLIC PANEL
BUN: 15 mg/dL (ref 6–23)
CO2: 25 mEq/L (ref 19–32)
Calcium: 9.7 mg/dL (ref 8.4–10.5)
Chloride: 103 mEq/L (ref 96–112)
Creatinine, Ser: 0.91 mg/dL (ref 0.40–1.50)
GFR: 91.12 mL/min (ref >60.00)
GLUCOSE: 160 mg/dL — AB (ref 70–99)
POTASSIUM: 4 meq/L (ref 3.5–5.1)
SODIUM: 134 meq/L — AB (ref 135–145)

## 2014-08-20 LAB — HEMOGLOBIN A1C: Hgb A1c MFr Bld: 8.4 % — ABNORMAL HIGH (ref 4.6–6.5)

## 2014-08-20 NOTE — Patient Instructions (Addendum)
Check blood sugars on waking up .Marland Kitchen3-5  .Marland Kitchen times a week  Also check blood sugars about 2 hours after a meal and do this after different meals by rotation  Recommended blood sugar levels on waking up is 90-130 and about 2 hours after meal is 140-180 Please bring blood sugar monitor to each visit.  Increase Toujeo to 100 at least  Reduce Humalog by 5 at Bfst and lunch from current dose Check cost of Tresiba  Protein at bedtime

## 2014-08-20 NOTE — Progress Notes (Signed)
Patient ID: Arthur Mata, male   DOB: 1955-03-04, 59 y.o.   MRN: [id number]           Reason for Appointment:  Follow-up for Type 2 Diabetes  Referring physician: Girtha Rm  History of Present Illness:          Diagnosis: Type 2 diabetes mellitus, date of diagnosis: 12/2011      Past history:  At the time of diagnosis he had symptoms of frequent urination, thirst and blurred vision He thinks his blood sugar was about 600.  His physician started him on Novolog and also Metformin  Details of initial treatment are not available but he also apparently took Victoza at some point before insulin and he does not think it worked After some time he was also given Levemir in addition to his NovoLog His blood sugars were somewhat better controlled after some time and were averaging about 200. He thinks that because of diarrhea his metformin was stopped after a few months.  Apparently in 10/2012 he had epidural steroid and with this his blood sugar went up to 600.   He had continued to have low back pain also has not had  repeat epidural steroid injections However he thinks his blood sugars had been persistently poorly controlled since then. He has been under the care of an endocrinologist in [place]. Actos was started in 06/2012 without much benefit. He was also tried on Invokana for about 3 months and reportedly had no benefit from this. He was started on U-500 instead of Levemir and NovoLog in late 2014 His blood sugars had been poorly controlled with A1c ranging from 10.5-12.5 and recently 11.5 prior to his initial consultation.  Recent history:  INSULIN regimen is described as:  Toujeo 95 units hs., Humalog U-200 = 50-30-50 AC  On his initial consultation he was switched from the U-500 insulin to a combination of Toujeo 120 units and Humalog U-200 70-40-70 before meals   With this his blood sugars improved considerably and insulin dose was reduced  His A1c is down to 7.2  As of 3/16.  He was  wanting to lose weight and he was told to hold off on his Actos and see if his blood sugars were changed..      Current blood sugar patterns and problems identified:   He has mostly high fasting readings in the morning, relatively higher than before.He was asked to increase his Toujeo to 100 units but he did not do so.   Occasionally may have a blood sugar near normal waking up and he is not aware of why it is better on some days, he is not unusually active in the evenings and does not have a lot of snacks late at night.   He has a couple of good readings after breakfast and lunch without hypoglycemia   He had an episode of hypoglycemia 1 morning after breakfast without any activity and had symptoms of sweating and glucose was 56   He is usually watching his carbohydrates and does not eat large meals but he is concerned about his lack of weight gain   He is doing a lot of walking but not much of the time because of his back pain.   he has not done many readings after supper despite instructions   He complains of some burning with Toujeo at the site of injection   Oral hypoglycemic drugs the patient is taking are:  None, was on Actos 45 mg  Side effects from medications have been: Diarrhea from metformin Compliance with the medical regimen: Good   Glucose monitoring:  done 1-2 times a day         Glucometer: Contour next      Blood Glucose readings by download  Mean values apply above for all meters except median for One Touch  PRE-MEAL Fasting Lunch Dinner Bedtime Overall  Glucose range: 122-242 78, 132     Mean/median: 175    154   POST-MEAL PC Breakfast PC Lunch PC Dinner  Glucose range: 56, 73 95, 100 ?  Mean/median:        Self-care:   He has seen the dietitian and has been trying to usually get low carbohydrate low fat meals.  Meals: 3 meals per day. Breakfast is usually egg and toast, lunch is usually a meat and crackers, evening meal usually chicken and vegetables.   Snacks: Popcorn, orange or apple           Exercise:  walking up to 30 minutes, about 3 times a week.          Dietician visit, most recent: 1/16               Weight history: Previously 202-236  Wt Readings from Last 3 Encounters:  08/20/14 224 lb 9.6 oz (101.878 kg)  07/10/14 229 lb (103.874 kg)  05/25/14 227 lb 6.4 oz (103.148 kg)   Glycemic control:   Lab Results  Component Value Date   HGBA1C 7.2* 05/22/2014   HGBA1C 11.5* 01/31/2014   Lab Results  Component Value Date   MICROALBUR 2.6* 05/22/2014   CREATININE 1.07 05/22/2014         Medication List       This list is accurate as of: 08/20/14  8:32 AM.  Always use your most recent med list.               alprazolam 2 MG tablet  Commonly known as:  XANAX  Take 2 mg by mouth 3 (three) times daily as needed.     aspirin 81 MG tablet  Take 81 mg by mouth daily.     atorvastatin 40 MG tablet  Commonly known as:  LIPITOR  Take 40 mg by mouth daily.     buPROPion 150 MG 12 hr tablet  Commonly known as:  WELLBUTRIN SR  Take 150 mg by mouth 2 (two) times daily.     furosemide 20 MG tablet  Commonly known as:  LASIX  Take 20 mg by mouth every morning. Can take 1 or 2 tablets PRN ankle swelling     gabapentin 600 MG tablet  Commonly known as:  NEURONTIN  Take 600 mg by mouth 3 (three) times daily.     glucose blood test strip  Commonly known as:  BAYER CONTOUR NEXT TEST  Use to check blood sugar 5 times per day dx code E11.65     Insulin Glargine 300 UNIT/ML Sopn  Commonly known as:  TOUJEO SOLOSTAR  Inject 120 Units into the skin daily before breakfast.     insulin lispro 100 UNIT/ML KiwkPen  Commonly known as:  HUMALOG  Inject 30-50 Units into the skin. Currently taking 50-30-50 units at BF-Lunch-Dinner, with adjustments as needed for exercise/activity.     Insulin Lispro (Human) 200 UNIT/ML Sopn  Commonly known as:  HUMALOG KWIKPEN  Inject 40-70 Units into the skin 3 (three) times daily before  meals.     Insulin Pen Needle 32G  X 4 MM Misc  Commonly known as:  BD PEN NEEDLE NANO U/F  Use to inject insulin 4 times daily as instructed.     LANOXIN 0.25 MG tablet  Generic drug:  digoxin  Take 0.25 mg by mouth daily.     lisinopril 5 MG tablet  Commonly known as:  PRINIVIL,ZESTRIL  Take 5 mg by mouth daily.     metoprolol succinate 25 MG 24 hr tablet  Commonly known as:  TOPROL-XL  Take 25 mg by mouth daily.     nitroGLYCERIN 0.4 MG SL tablet  Commonly known as:  NITROSTAT  Place 1 tablet (0.4 mg total) under the tongue every 5 (five) minutes as needed for chest pain.     pioglitazone 45 MG tablet  Commonly known as:  ACTOS  Take 45 mg by mouth daily.        Allergies:  Allergies  Allergen Reactions  . Prednisone     Patient allergic to steroids    Past Medical History  Diagnosis Date  . Diabetes mellitus without complication   . Angina pectoris   . Palpitations   . Chest pain   . Hyperlipidemia   . Hypertension   . Near syncope   . Bone spur of left foot   . Obesity   . Mumps   . Measles     Past Surgical History  Procedure Laterality Date  . Spine surgery  03/21/13    Lumbar fusion  . Anterior cervical discectomy      Family History  Problem Relation Age of Onset  . Diabetes Mother   . Diabetes Father   . Heart disease Father   . Diabetes Brother   . Hyperlipidemia Other   . Hypertension Other     Social History:  reports that he has been smoking Cigarettes.  He started smoking about 5 months ago. He has been smoking about 0.25 packs per day. He has never used smokeless tobacco. His alcohol and drug histories are not on file.    Review of Systems   Most recent eye exam was in 1/16       Lipids: In 12/15 showed LDL 69, HDL 24 and triglycerides 168.  Has been on Lipitor for quite some time      No results found for: CHOL, HDL, LDLCALC, LDLDIRECT, TRIG, CHOLHDL                     He has a history of left leg  Numbness, tingling and  burning and also some in his left foot.  Taking gabapentin high doses for this.  Foot exam in 12/15 showed the following: significantly decreased monofilament sensation in the toes especially left fifth toe, no skin lesions or ulcers on the feet and normal pulses  LABS:  No visits with results within 1 Week(s) from this visit. Latest known visit with results is:  Lab on 05/22/2014  Component Date Value Ref Range Status  . Hgb A1c MFr Bld 05/22/2014 7.2* 4.6 - 6.5 % Final   Glycemic Control Guidelines for People with Diabetes:Non Diabetic:  <6%Goal of Therapy: <7%Additional Action Suggested:  >8%   . Sodium 05/22/2014 137  135 - 145 mEq/L Final  . Potassium 05/22/2014 4.0  3.5 - 5.1 mEq/L Final  . Chloride 05/22/2014 105  96 - 112 mEq/L Final  . CO2 05/22/2014 27  19 - 32 mEq/L Final  . Glucose, Bld 05/22/2014 144* 70 - 99 mg/dL Final  . BUN  05/22/2014 17  6 - 23 mg/dL Final  . Creatinine, Ser 05/22/2014 1.07  0.40 - 1.50 mg/dL Final  . Calcium 05/22/2014 9.6  8.4 - 10.5 mg/dL Final  . GFR 05/22/2014 75.65  >60.00 mL/min Final  . Microalb, Ur 05/22/2014 2.6* 0.0 - 1.9 mg/dL Final  . Creatinine,U 05/22/2014 229.7   Final  . Microalb Creat Ratio 05/22/2014 1.1  0.0 - 30.0 mg/g Final    Physical Examination:  BP 128/68 mmHg  Pulse 61  Temp(Src) 98 F (36.7 C)  Resp 16  Ht 5\' 11"  (1.803 m)  Wt 224 lb 9.6 oz (101.878 kg)  BMI 31.34 kg/m2  SpO2 95%       ASSESSMENT:  Diabetes type 2, uncontrolled  See history of present illness for detailed description of his daily regimen, problems identified and blood sugar patterns He is having relatively high fasting blood sugars with frequent readings over 200 but has not adjusted his Toujeo at night as directed However he has couple of good readings in the mornings also and not clear what causes the variability, he is generally eating fairly good diet and not excessive snacks at night Not clear if this is because of stopping Actos; also  has not had any weight change with stopping Actos. However he has not checked his blood sugars after supper at all His blood sugars appear to be low normal after breakfast and lunch Also has had one episode of unexpected hypoglycemia after breakfast.  PLAN:   He will try to increase his Toujeo by 5 units for now in the evening, he can continue to take this in 2 separate injections to reduce the local discomfort.   Discussed when to check his blood sugars especially after supper and he can adjust his meal time dose based on size of meal especially at lunch and supper.  Reduce coverage of breakfast and lunch by 5 units for now and continue adjusting the dose down if planning to be more active  Continue to reduce mealtime insulin to about 50% when planning to be very active  Consider restarting Actos if A1c higher  Adjust suppertime Humalog further if postprandial readings are high  Consider changing Toujeo to [place] if covered by insurance  Balanced bedtime snack with some protein daily  Follow-up in 3 months again for review  Consultation with dietitian on the next visit  Patient Instructions  Check blood sugars on waking up .Marland Kitchen3-5  .Marland Kitchen times a week  Also check blood sugars about 2 hours after a meal and do this after different meals by rotation  Recommended blood sugar levels on waking up is 90-130 and about 2 hours after meal is 140-180 Please bring blood sugar monitor to each visit.  Increase Toujeo to 100 at least  Reduce Humalog by 5 at [facility] and lunch from current dose  Check cost of [facility] [employer]as    Counseling time on subjects discussed above is over 50% of today's 25 minute visit   Arthur Mata 08/20/2014, 8:32 AM   Note: This office note was prepared with Dragon voice recognition system technology. Any transcriptional errors that result from this process are unintentional.

## 2014-08-20 NOTE — Progress Notes (Signed)
Quick Note:  Please let patient know that the A1c up to 8.4, restat actos and go up on Toujeo to get am sugar < 140-150  ______

## 2014-08-24 ENCOUNTER — Ambulatory Visit: Payer: 59 | Admitting: Endocrinology

## 2014-08-28 ENCOUNTER — Other Ambulatory Visit: Payer: Self-pay | Admitting: Endocrinology

## 2014-09-06 ENCOUNTER — Ambulatory Visit: Payer: 59 | Admitting: Dietician

## 2014-10-04 ENCOUNTER — Other Ambulatory Visit: Payer: Self-pay | Admitting: *Deleted

## 2014-10-04 MED ORDER — INSULIN GLARGINE 300 UNIT/ML ~~LOC~~ SOPN: 120.0000 [IU] | PEN_INJECTOR | Freq: Every day | SUBCUTANEOUS | Status: DC

## 2014-10-10 ENCOUNTER — Ambulatory Visit (INDEPENDENT_AMBULATORY_CARE_PROVIDER_SITE_OTHER): Payer: Self-pay | Admitting: Family Medicine

## 2014-10-10 VITALS — BP 126/72 | HR 55 | Ht 71.0 in | Wt 223.6 lb

## 2014-10-10 DIAGNOSIS — E1165 Type 2 diabetes mellitus with hyperglycemia: Secondary | ICD-10-CM

## 2014-10-10 DIAGNOSIS — IMO0002 Reserved for concepts with insufficient information to code with codable children: Secondary | ICD-10-CM

## 2014-10-10 NOTE — Patient Instructions (Signed)
1. Change to 120 Units of Toujeo and restart Actos (per Dr. Ronnie Derby recommendation 08/20/14).  2. Be more active in the determination of the Humalog dose. If you are going to be very active, you can reduce the dose. If you have a high reading, you can be more aggressive.  3. Continue to improve diet via reduction of carbohydrates. Continue weight loss via calorie reduction (portion control).  Next visits with Dr. Dwyane Dee: Labs 11/20/14 @ 815, Visit 11/26/14 @ 0800

## 2014-10-10 NOTE — Progress Notes (Signed)
Subjective:  Patient presents today for 3 month diabetes follow-up as part of the employer-sponsored [employer] to Well[employer] program.  Current diabetes regimen includes Toujeo (100 units being increased to 120 units), Humalog Kwikpen 50-30-50 units, to restart Actos.  Patient also continues on daily ASA, ACE Inhibitor and statin.  Most recent MD follow-up was 08/20/14 with Dr. Dwyane Dee (endo). Next appointment is 11/20/14. Dr. Dwyane Dee recommended an increase at last visit (08/20/14), but the patient did not understand this communication so I have clarified today. Changes to be implemented as of today. Mr. Gregori has felt healthier since last visit, but notes significant increases in blood glucoses.    Assessment/Plan:  Patient is a 59 y.o. male with DM 2. Most recent A1C was 8.4% on 08/20/14 which is exceeding goal of 7%. Weight is down 5.4 lbs from last visit to 223.6. He attributes this to more activity.   Lifestyle improvements:  Physical Activity-  Increased walking and activity in preparation for hunting season. Notes consistent hypoglycemia when active... And no hypoglycemia when inactive.  Nutrition-  Diet continues to be fairly well controlled with some bouts of poor eating.   Reports some difficulty with vision since cataract surgeries. Reports eyes feel like they are "bugging out" and that he can see the lens in his eye. Opthalmologist has said the problem will go away with time.  Follow up with me in 3 months.    Goals for Next Visit:  1. Change to 120 Units of Toujeo and restart Actos (per Dr. Ronnie Derby recommendation 08/20/14).  2. Be more active in the determination of the Humalog dose. If you are going to be very active, you can reduce the dose. If you have a high reading, you can be more aggressive. 3. Continue to improve diet via reduction of carbohydrates. Continue weight loss via calorie reduction (portion control).  Next visits with Dr. Dwyane Dee: Labs 11/20/14 @ 815, Visit 11/26/14 @ 0800 Next  appointment to see me is: 3 months.

## 2014-10-18 NOTE — Progress Notes (Signed)
ATTENDING PHYSICIAN NOTE: I have reviewed the chart and agree with the plan as detailed above. Breeley Bischof MD Pager [phone]  

## 2014-11-06 ENCOUNTER — Other Ambulatory Visit: Payer: Self-pay

## 2014-11-06 MED ORDER — DIGOXIN 250 MCG PO TABS: 0.2500 mg | ORAL_TABLET | Freq: Every day | ORAL | Status: DC

## 2014-11-06 NOTE — Telephone Encounter (Signed)
Arthur Coco, MD at 04/13/2014 11:00 AM  digoxin (LANOXIN) 0.25 MG tabletTake 0.25 mg by mouth daily Your physician recommends that you continue on your current medications as directed. Please refer to the Current Medication list given to you today. 3. History of paroxysmal tachycardia controlled on digoxin

## 2014-11-08 ENCOUNTER — Encounter: Payer: Self-pay | Admitting: Cardiology

## 2014-11-11 ENCOUNTER — Encounter (HOSPITAL_BASED_OUTPATIENT_CLINIC_OR_DEPARTMENT_OTHER): Payer: Self-pay | Admitting: Emergency Medicine

## 2014-11-11 ENCOUNTER — Emergency Department (HOSPITAL_BASED_OUTPATIENT_CLINIC_OR_DEPARTMENT_OTHER): Payer: 59

## 2014-11-11 ENCOUNTER — Emergency Department (HOSPITAL_BASED_OUTPATIENT_CLINIC_OR_DEPARTMENT_OTHER)
Admission: EM | Admit: 2014-11-11 | Discharge: 2014-11-11 | Disposition: A | Discharge status: Home / Self Care | Payer: 59 | Attending: Emergency Medicine | Admitting: Emergency Medicine

## 2014-11-11 DIAGNOSIS — E119 Type 2 diabetes mellitus without complications: Secondary | ICD-10-CM | POA: Insufficient documentation

## 2014-11-11 DIAGNOSIS — Y9355 Activity, bike riding: Secondary | ICD-10-CM | POA: Insufficient documentation

## 2014-11-11 DIAGNOSIS — S199XXA Unspecified injury of neck, initial encounter: Secondary | ICD-10-CM | POA: Diagnosis not present

## 2014-11-11 DIAGNOSIS — Z7982 Long term (current) use of aspirin: Secondary | ICD-10-CM | POA: Insufficient documentation

## 2014-11-11 DIAGNOSIS — Z794 Long term (current) use of insulin: Secondary | ICD-10-CM | POA: Diagnosis not present

## 2014-11-11 DIAGNOSIS — S42443A Displaced fracture (avulsion) of medial epicondyle of unspecified humerus, initial encounter for closed fracture: Secondary | ICD-10-CM

## 2014-11-11 DIAGNOSIS — Y92410 Unspecified street and highway as the place of occurrence of the external cause: Secondary | ICD-10-CM | POA: Insufficient documentation

## 2014-11-11 DIAGNOSIS — Z79899 Other long term (current) drug therapy: Secondary | ICD-10-CM | POA: Diagnosis not present

## 2014-11-11 DIAGNOSIS — I209 Angina pectoris, unspecified: Secondary | ICD-10-CM | POA: Diagnosis not present

## 2014-11-11 DIAGNOSIS — Z8739 Personal history of other diseases of the musculoskeletal system and connective tissue: Secondary | ICD-10-CM | POA: Insufficient documentation

## 2014-11-11 DIAGNOSIS — S3992XA Unspecified injury of lower back, initial encounter: Secondary | ICD-10-CM | POA: Diagnosis not present

## 2014-11-11 DIAGNOSIS — E785 Hyperlipidemia, unspecified: Secondary | ICD-10-CM | POA: Diagnosis not present

## 2014-11-11 DIAGNOSIS — I10 Essential (primary) hypertension: Secondary | ICD-10-CM | POA: Diagnosis not present

## 2014-11-11 DIAGNOSIS — E669 Obesity, unspecified: Secondary | ICD-10-CM | POA: Diagnosis not present

## 2014-11-11 DIAGNOSIS — Y998 Other external cause status: Secondary | ICD-10-CM | POA: Insufficient documentation

## 2014-11-11 DIAGNOSIS — T148 Other injury of unspecified body region: Secondary | ICD-10-CM | POA: Diagnosis not present

## 2014-11-11 DIAGNOSIS — Z8619 Personal history of other infectious and parasitic diseases: Secondary | ICD-10-CM | POA: Diagnosis not present

## 2014-11-11 DIAGNOSIS — Z720 Tobacco use: Secondary | ICD-10-CM | POA: Diagnosis not present

## 2014-11-11 DIAGNOSIS — S4992XA Unspecified injury of left shoulder and upper arm, initial encounter: Secondary | ICD-10-CM | POA: Diagnosis present

## 2014-11-11 DIAGNOSIS — S42441A Displaced fracture (avulsion) of medial epicondyle of right humerus, initial encounter for closed fracture: Secondary | ICD-10-CM | POA: Insufficient documentation

## 2014-11-11 DIAGNOSIS — T07XXXA Unspecified multiple injuries, initial encounter: Secondary | ICD-10-CM

## 2014-11-11 MED ORDER — NAPROXEN 500 MG PO TABS: 500.0000 mg | ORAL_TABLET | Freq: Two times a day (BID) | ORAL | Status: DC

## 2014-11-11 MED ORDER — METHOCARBAMOL 500 MG PO TABS: 500.0000 mg | ORAL_TABLET | Freq: Two times a day (BID) | ORAL | Status: DC

## 2014-11-11 MED ORDER — IBUPROFEN 800 MG PO TABS
800.0000 mg | ORAL_TABLET | Freq: Once | ORAL | Status: AC
  Administered 2014-11-11: 800 mg via ORAL
  Filled 2014-11-11: qty 1

## 2014-11-11 MED ORDER — OXYCODONE-ACETAMINOPHEN 5-325 MG PO TABS
2.0000 | ORAL_TABLET | Freq: Once | ORAL | Status: AC
  Administered 2014-11-11: 2 via ORAL
  Filled 2014-11-11: qty 2

## 2014-11-11 MED ORDER — OXYCODONE-ACETAMINOPHEN 5-325 MG PO TABS: 2.0000 | ORAL_TABLET | ORAL | Status: DC | PRN
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
## 2014-11-11 NOTE — Discharge Instructions (Signed)
Avoid use of your right hand and arm until your pain and symptoms at your elbow resolved.  Contusion A contusion is a deep bruise. Contusions are the result of an injury that caused bleeding under the skin. The contusion may turn blue, purple, or yellow. Minor injuries will give you a painless contusion, but more severe contusions may stay painful and swollen for a few weeks.  CAUSES  A contusion is usually caused by a blow, trauma, or direct force to an area of the body. SYMPTOMS   Swelling and redness of the injured area.  Bruising of the injured area.  Tenderness and soreness of the injured area.  Pain. DIAGNOSIS  The diagnosis can be made by taking a history and physical exam. An X-ray, CT scan, or MRI may be needed to determine if there were any associated injuries, such as fractures. TREATMENT  Specific treatment will depend on what area of the body was injured. In general, the best treatment for a contusion is resting, icing, elevating, and applying cold compresses to the injured area. Over-the-counter medicines may also be recommended for pain control. Ask your caregiver what the best treatment is for your contusion. HOME CARE INSTRUCTIONS   Put ice on the injured area.  Put ice in a plastic bag.  Place a towel between your skin and the bag.  Leave the ice on for 15-20 minutes, 3-4 times a day, or as directed by your health care provider.  Only take over-the-counter or prescription medicines for pain, discomfort, or fever as directed by your caregiver. Your caregiver may recommend avoiding anti-inflammatory medicines (aspirin, ibuprofen, and naproxen) for 48 hours because these medicines may increase bruising.  Rest the injured area.  If possible, elevate the injured area to reduce swelling. SEEK IMMEDIATE MEDICAL CARE IF:   You have increased bruising or swelling.  You have pain that is getting worse.  Your swelling or pain is not relieved with medicines. MAKE SURE  YOU:   Understand these instructions.  Will watch your condition.  Will get help right away if you are not doing well or get worse. Document Released: 11/19/2004 Document Revised: 02/14/2013 Document Reviewed: 12/15/2010 [facility] Patient Information 2015 [place], [place]. This information is not intended to replace advice given to you by your health care provider. Make sure you discuss any questions you have with your health care provider.
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
## 2014-11-20 ENCOUNTER — Other Ambulatory Visit (INDEPENDENT_AMBULATORY_CARE_PROVIDER_SITE_OTHER): Payer: 59

## 2014-11-20 DIAGNOSIS — IMO0002 Reserved for concepts with insufficient information to code with codable children: Secondary | ICD-10-CM

## 2014-11-20 DIAGNOSIS — E1165 Type 2 diabetes mellitus with hyperglycemia: Secondary | ICD-10-CM | POA: Diagnosis not present

## 2014-11-20 LAB — BASIC METABOLIC PANEL
BUN: 14 mg/dL (ref 6–23)
CALCIUM: 9.1 mg/dL (ref 8.4–10.5)
CO2: 27 meq/L (ref 19–32)
CREATININE: 0.88 mg/dL (ref 0.40–1.50)
Chloride: 105 mEq/L (ref 96–112)
GFR: 94.63 mL/min (ref >60.00)
Glucose, Bld: 125 mg/dL — ABNORMAL HIGH (ref 70–99)
Potassium: 4.2 mEq/L (ref 3.5–5.1)
SODIUM: 138 meq/L (ref 135–145)

## 2014-11-20 LAB — HEMOGLOBIN A1C: HEMOGLOBIN A1C: 8.8 % — AB (ref 4.6–6.5)

## 2014-11-26 ENCOUNTER — Encounter: Payer: Self-pay | Admitting: Endocrinology

## 2014-11-26 ENCOUNTER — Other Ambulatory Visit: Payer: Self-pay | Admitting: *Deleted

## 2014-11-26 ENCOUNTER — Ambulatory Visit (INDEPENDENT_AMBULATORY_CARE_PROVIDER_SITE_OTHER): Payer: 59 | Admitting: Endocrinology

## 2014-11-26 VITALS — BP 126/72 | HR 54 | Temp 98.3°F | Resp 16 | Ht 71.0 in | Wt 223.6 lb

## 2014-11-26 DIAGNOSIS — E1165 Type 2 diabetes mellitus with hyperglycemia: Secondary | ICD-10-CM

## 2014-11-26 DIAGNOSIS — E1142 Type 2 diabetes mellitus with diabetic polyneuropathy: Secondary | ICD-10-CM | POA: Diagnosis not present

## 2014-11-26 DIAGNOSIS — IMO0002 Reserved for concepts with insufficient information to code with codable children: Secondary | ICD-10-CM

## 2014-11-26 DIAGNOSIS — Z794 Long term (current) use of insulin: Secondary | ICD-10-CM

## 2014-11-26 MED ORDER — INSULIN LISPRO 200 UNIT/ML ~~LOC~~ SOPN: 130.0000 [IU] | PEN_INJECTOR | Freq: Three times a day (TID) | SUBCUTANEOUS | Status: DC

## 2014-11-26 MED ORDER — PIOGLITAZONE HCL 30 MG PO TABS: 30.0000 mg | ORAL_TABLET | Freq: Every day | ORAL | Status: DC

## 2014-11-26 MED ORDER — INSULIN PEN NEEDLE 32G X 4 MM MISC: Status: DC

## 2014-11-26 MED ORDER — INSULIN GLARGINE 300 UNIT/ML ~~LOC~~ SOPN: 120.0000 [IU] | PEN_INJECTOR | Freq: Every day | SUBCUTANEOUS | Status: DC

## 2014-11-26 NOTE — Patient Instructions (Addendum)
Check blood sugars on waking up .Marland Kitchen 3-4 .Marland Kitchen times a week Also check blood sugars about 2 hours after a meal and do this after different meals by rotation  Recommended blood sugar levels on waking up is 90-130 and about 2 hours after meal is 140-180 Please bring blood sugar monitor to each visit.  50 Humalog at supper

## 2014-11-26 NOTE — Progress Notes (Signed)
Patient ID: Arthur Mata, male   DOB: Oct 17, 1955, 59 y.o.   MRN: [id number]           Reason for Appointment:  Follow-up for Type 2 Diabetes  Referring physician: Girtha Rm  History of Present Illness:          Diagnosis: Type 2 diabetes mellitus, date of diagnosis: 12/2011      Past history:  At the time of diagnosis he had symptoms of frequent urination, thirst and blurred vision He thinks his blood sugar was about 600.  His physician started him on Novolog and also Metformin  Details of initial treatment are not available but he also apparently took Victoza at some point before insulin and he does not think it worked After some time he was also given Levemir in addition to his NovoLog His blood sugars were somewhat better controlled after some time and were averaging about 200. He thinks that because of diarrhea his metformin was stopped after a few months.  Apparently in 10/2012 he had epidural steroid and with this his blood sugar went up to 600.   He had continued to have low back pain also has not had  repeat epidural steroid injections However he thinks his blood sugars had been persistently poorly controlled since then. He has been under the care of an endocrinologist in [place]. Actos was started in 06/2012 without much benefit. He was also tried on Invokana for about 3 months and reportedly had no benefit from this. He was started on U-500 instead of Levemir and NovoLog in late 2014 His blood sugars had been poorly controlled with A1c ranging from 10.5-12.5 and recently 11.5 prior to his initial consultation.  Recent history:  INSULIN regimen is described as:  Toujeo 120 units hs., Humalog U-200 = 50-30-45/50 AC  On his initial consultation he was switched from the U-500 insulin to a combination of Toujeo 120 units and Humalog U-200 70-40-70 before meals   With this his blood sugars improved considerably and insulin dose was reduced, also A1c came down to 7.2 However  subsequently his A1c has progressively increased and now 8.8 Actos had been stopped because of weight gain     Current blood sugar patterns and problems identified:   He reportedly has fairly good blood sugars in the mornings and occasionally readings around 90 he will feel a little dizzy.  He was told to increase his Toujeo to 100 units but he has increase at all the way to 120 on his own  He thinks his sugars may be high on some days when he is reducing his Humalog for possible physical activity and then he does not end up doing anything.  However he is usually forgetting to check his blood sugars after meals  He cannot pinpoint when his blood sugars are high except sometimes when eating out  He is compliant with taking his insulin before meals and like to take Humalog 30 minutes before eating which does not cause any low sugars   He is doing a lot of walking but not much of the time because of his back pain.  Oral hypoglycemic drugs the patient is taking are:  None, was on Actos 45 mg      Side effects from medications have been: Diarrhea from metformin Compliance with the medical regimen: Good   Glucose monitoring:  done 1-2 times a day         Glucometer: Contour next      Blood Glucose readings  by recall:  Mean values apply above for all meters except median for One Touch  PRE-MEAL Fasting Lunch Dinner Bedtime Overall  Glucose range: 90-146 125 130-140 148-223   Mean/median:          Self-care:   He has seen the dietitian and has been trying to usually get low carbohydrate low fat meals.  Meals: 3 meals per day. Breakfast is usually egg and toast, lunch is usually a meat and crackers, evening meal usually chicken and vegetables.  Snacks: Popcorn, orange or apple           Exercise:  walking up to 30 minutes, upto 3 times a week.          Dietician visit, most recent: 1/16               Weight history: Previously 202-236  Wt Readings from Last 3 Encounters:  11/26/14  223 lb 9.6 oz (101.424 kg)  11/11/14 223 lb (101.152 kg)  10/10/14 223 lb 9.6 oz (101.424 kg)   Glycemic control:   Lab Results  Component Value Date   HGBA1C 8.8* 11/20/2014   HGBA1C 8.4* 08/20/2014   HGBA1C 7.2* 05/22/2014   Lab Results  Component Value Date   MICROALBUR 2.6* 05/22/2014   CREATININE 0.88 11/20/2014         Medication List       This list is accurate as of: 11/26/14  9:08 AM.  Always use your most recent med list.               alprazolam 2 MG tablet  Commonly known as:  XANAX  Take 2 mg by mouth 3 (three) times daily as needed.     aspirin 81 MG tablet  Take 81 mg by mouth daily.     atorvastatin 40 MG tablet  Commonly known as:  LIPITOR  Take 40 mg by mouth daily.     BAYER CONTOUR NEXT TEST test strip  Generic drug:  glucose blood  USE TO CHECK BLOOD SUGAR 5 TIMES PER DAY     buPROPion 150 MG 12 hr tablet  Commonly known as:  WELLBUTRIN SR  Take 150 mg by mouth 2 (two) times daily.     digoxin 0.25 MG tablet  Commonly known as:  LANOXIN  Take 1 tablet (0.25 mg total) by mouth daily.     furosemide 20 MG tablet  Commonly known as:  LASIX  Take 20 mg by mouth every morning. Can take 1 or 2 tablets PRN ankle swelling     gabapentin 600 MG tablet  Commonly known as:  NEURONTIN  Take 600 mg by mouth 3 (three) times daily.     Insulin Glargine 300 UNIT/ML Sopn  Commonly known as:  TOUJEO SOLOSTAR  Inject 120 Units into the skin daily before breakfast.     Insulin Lispro (Human) 200 UNIT/ML Sopn  Commonly known as:  HUMALOG KWIKPEN  Inject 130 Units into the skin 3 (three) times daily. Inject 50 units at breakfast, 30 units at lunch and 50 units at dinner     Insulin Pen Needle 32G X 4 MM Misc  Commonly known as:  BD PEN NEEDLE NANO U/F  Use to inject insulin 4 times daily as instructed.     lisinopril 5 MG tablet  Commonly known as:  PRINIVIL,ZESTRIL  Take 5 mg by mouth daily.     methocarbamol 500 MG tablet  Commonly known  as:  ROBAXIN  Take 1 tablet (500  mg total) by mouth 2 (two) times daily.     metoprolol succinate 25 MG 24 hr tablet  Commonly known as:  TOPROL-XL  Take 25 mg by mouth daily.     naproxen 500 MG tablet  Commonly known as:  NAPROSYN  Take 1 tablet (500 mg total) by mouth 2 (two) times daily.     nitroGLYCERIN 0.4 MG SL tablet  Commonly known as:  NITROSTAT  Place 1 tablet (0.4 mg total) under the tongue every 5 (five) minutes as needed for chest pain.     oxyCODONE-acetaminophen 5-325 MG tablet  Commonly known as:  PERCOCET/ROXICET  Take 2 tablets by mouth every 4 (four) hours as needed.     pioglitazone 30 MG tablet  Commonly known as:  ACTOS  Take 1 tablet (30 mg total) by mouth daily.        Allergies:  Allergies  Allergen Reactions  . Prednisone     Patient allergic to steroids    Past Medical History  Diagnosis Date  . Diabetes mellitus without complication ([place])   . Angina pectoris ([place])   . Palpitations   . Chest pain   . Hyperlipidemia   . Hypertension   . Near syncope   . Bone spur of left foot   . Obesity   . Mumps   . Measles     Past Surgical History  Procedure Laterality Date  . Spine surgery  03/21/13    Lumbar fusion  . Anterior cervical discectomy      Family History  Problem Relation Age of Onset  . Diabetes Mother   . Diabetes Father   . Heart disease Father   . Diabetes Brother   . Hyperlipidemia Other   . Hypertension Other     Social History:  reports that he has been smoking Cigarettes.  He started smoking about 9 months ago. He has been smoking about 0.25 packs per day. He has never used smokeless tobacco. His alcohol and drug histories are not on file.    Review of Systems   Most recent eye exam was in 1/16       Lipids: In 12/15 showed LDL 69, HDL 24 and triglycerides 168.  Has been on Lipitor for quite some time      No results found for: CHOL, HDL, LDLCALC, LDLDIRECT, TRIG, CHOLHDL                     He has a  history of left leg  Numbness, tingling and burning and also some in his left foot.  Taking gabapentin high doses for this.  Foot exam in 12/15 showed the following: significantly decreased monofilament sensation in the toes especially left fifth toe, no skin lesions or ulcers on the feet and normal pulses  LABS:  Appointment on 11/20/2014  Component Date Value Ref Range Status  . Hgb A1c MFr Bld 11/20/2014 8.8* 4.6 - 6.5 % Final   Glycemic Control Guidelines for People with Diabetes:Non Diabetic:  <6%Goal of Therapy: <7%Additional Action Suggested:  >8%   . Sodium 11/20/2014 138  135 - 145 mEq/L Final  . Potassium 11/20/2014 4.2  3.5 - 5.1 mEq/L Final  . Chloride 11/20/2014 105  96 - 112 mEq/L Final  . CO2 11/20/2014 27  19 - 32 mEq/L Final  . Glucose, Bld 11/20/2014 125* 70 - 99 mg/dL Final  . BUN 11/20/2014 14  6 - 23 mg/dL Final  . Creatinine, Ser 11/20/2014 0.88  0.40 -  1.50 mg/dL Final  . Calcium 11/20/2014 9.1  8.4 - 10.5 mg/dL Final  . GFR 11/20/2014 94.63  >60.00 mL/min Final    Physical Examination:  BP 126/72 mmHg  Pulse 54  Temp(Src) 98.3 F (36.8 C)  Resp 16  Ht 5\' 11"  (1.803 m)  Wt 223 lb 9.6 oz (101.424 kg)  BMI 31.20 kg/m2  SpO2 95%       ASSESSMENT:  Diabetes type 2, uncontrolled  See history of present illness for detailed description of his daily regimen, problems identified and blood sugar patterns Difficult to assess his control because of his not monitoring blood sugars much and not bringing his meter for download His A1c of nearly 9% indicates mostly postprandial hyperglycemia He has not been particular about adjusting his insulin based on his meal size and appears to be overall taking less mealtime coverage than needed  Also with his variable level of activity he has not been taking enough insulin for fear of hypoglycemia He is having overall better fasting readings with using 120 units of Toujeo However even though he thinks his blood sugars are  fairly good before supper and not clear if this is lasting 24 hours She is still reluctant to consider insulin pump  PLAN:   He will try to check blood sugars consistently in between meals and more after supper  He can try to take his evening insulin right after eating if he is eating out or not sure how much he will eat, may take up to 55 units  Adjust insulin at breakfast and lunch based on results of postprandial monitoring, discussed blood sugar targets of at least under 180 after meals  May consider Tresiba if his blood sugars are higher in the evenings  Retry Actos 30 mg daily  Consider trial of Invokana again if he has any weight gain  Bring blood sugar monitor for review in 6 weeks  Patient Instructions  Check blood sugars on waking up .Marland Kitchen 3-4 .Marland Kitchen times a week Also check blood sugars about 2 hours after a meal and do this after different meals by rotation  Recommended blood sugar levels on waking up is 90-130 and about 2 hours after meal is 140-180 Please bring blood sugar monitor to each visit.  50 Humalog at supper     Counseling time on subjects discussed above is over 50% of today's 25 minute visit   Kapil Petropoulos 11/26/2014, 9:08 AM   Note: This office note was prepared with Estate agent. Any transcriptional errors that result from this process are unintentional.

## 2014-12-18 ENCOUNTER — Ambulatory Visit (INDEPENDENT_AMBULATORY_CARE_PROVIDER_SITE_OTHER): Payer: Self-pay | Admitting: Family Medicine

## 2014-12-18 ENCOUNTER — Encounter: Payer: Self-pay | Admitting: Pharmacist

## 2014-12-18 VITALS — BP 131/65 | HR 49 | Ht 71.0 in | Wt 225.6 lb

## 2014-12-18 DIAGNOSIS — Z794 Long term (current) use of insulin: Secondary | ICD-10-CM

## 2014-12-18 DIAGNOSIS — E1136 Type 2 diabetes mellitus with diabetic cataract: Secondary | ICD-10-CM
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
## 2014-12-24 NOTE — Progress Notes (Signed)
I have reviewed this pharmacist's note  

## 2014-12-27 ENCOUNTER — Other Ambulatory Visit (HOSPITAL_COMMUNITY): Payer: Self-pay | Admitting: Physician Assistant

## 2015-01-03 ENCOUNTER — Other Ambulatory Visit (HOSPITAL_COMMUNITY): Payer: Self-pay | Admitting: Physician Assistant

## 2015-01-03 ENCOUNTER — Ambulatory Visit (INDEPENDENT_AMBULATORY_CARE_PROVIDER_SITE_OTHER): Payer: 59 | Admitting: Endocrinology

## 2015-01-03 ENCOUNTER — Encounter: Payer: Self-pay | Admitting: Endocrinology

## 2015-01-03 ENCOUNTER — Ambulatory Visit: Payer: 59 | Admitting: Physical Therapy

## 2015-01-03 VITALS — BP 128/72 | HR 51 | Temp 98.4°F | Resp 16 | Ht 71.0 in | Wt 223.0 lb

## 2015-01-03 DIAGNOSIS — Z794 Long term (current) use of insulin: Secondary | ICD-10-CM

## 2015-01-03 DIAGNOSIS — E1165 Type 2 diabetes mellitus with hyperglycemia: Secondary | ICD-10-CM | POA: Diagnosis not present

## 2015-01-03 DIAGNOSIS — E785 Hyperlipidemia, unspecified: Secondary | ICD-10-CM | POA: Diagnosis not present

## 2015-01-03 DIAGNOSIS — M5413 Radiculopathy, cervicothoracic region: Secondary | ICD-10-CM
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
## 2015-01-03 NOTE — Patient Instructions (Signed)
Check blood sugars on waking up 3  times a week Also check blood sugars about 2 hours after a meal and do this after different meals by rotation  Recommended blood sugar levels on waking up is 90-130 and about 2 hours after meal is 130-160  Please bring your blood sugar monitor to each visit, thank you  HUMALOG Try 25 at lunch for average lunch.                 TAKE 55 AT SUPPER AND take pen with you when eating out  TOUJEO 115 daily

## 2015-01-16 ENCOUNTER — Ambulatory Visit (HOSPITAL_COMMUNITY)
Admission: RE | Admit: 2015-01-16 | Discharge: 2015-01-16 | Disposition: A | Discharge status: Home / Self Care | Payer: 59 | Source: Ambulatory Visit | Attending: Physician Assistant | Admitting: Physician Assistant

## 2015-01-16 DIAGNOSIS — M50321 Other cervical disc degeneration at C4-C5 level: Secondary | ICD-10-CM | POA: Insufficient documentation

## 2015-01-16 DIAGNOSIS — M50322 Other cervical disc degeneration at C5-C6 level: Secondary | ICD-10-CM | POA: Diagnosis not present

## 2015-01-16 DIAGNOSIS — M5413 Radiculopathy, cervicothoracic region: Secondary | ICD-10-CM | POA: Insufficient documentation

## 2015-02-11 ENCOUNTER — Other Ambulatory Visit: Payer: Self-pay | Admitting: Neurosurgery

## 2015-02-11 DIAGNOSIS — M5413 Radiculopathy, cervicothoracic region: Secondary | ICD-10-CM

## 2015-02-21 ENCOUNTER — Ambulatory Visit
Admission: RE | Admit: 2015-02-21 | Discharge: 2015-02-21 | Disposition: A | Discharge status: Home / Self Care | Payer: 59 | Source: Ambulatory Visit | Attending: Neurosurgery | Admitting: Neurosurgery

## 2015-02-21 DIAGNOSIS — M5413 Radiculopathy, cervicothoracic region: Secondary | ICD-10-CM

## 2015-02-21 MED ORDER — DIAZEPAM 5 MG PO TABS
10.0000 mg | ORAL_TABLET | Freq: Once | ORAL | Status: AC
  Administered 2015-02-21: 10 mg via ORAL

## 2015-02-21 MED ORDER — ONDANSETRON HCL 4 MG/2ML IJ SOLN
4.0000 mg | Freq: Once | INTRAMUSCULAR | Status: AC
  Administered 2015-02-21: 4 mg via INTRAMUSCULAR

## 2015-02-21 MED ORDER — IOHEXOL 300 MG/ML  SOLN
10.0000 mL | Freq: Once | INTRAMUSCULAR | Status: AC | PRN
  Administered 2015-02-21: 10 mL via INTRATHECAL

## 2015-02-21 MED ORDER — MEPERIDINE HCL 100 MG/ML IJ SOLN
75.0000 mg | Freq: Once | INTRAMUSCULAR | Status: AC
  Administered 2015-02-21: 75 mg via INTRAMUSCULAR

## 2015-02-21 NOTE — Discharge Instructions (Signed)
Myelogram Discharge Instructions  1. Go home and rest quietly for the next 24 hours.  It is important to lie flat for the next 24 hours.  Get up only to go to the restroom.  You may lie in the bed or on a couch on your back, your stomach, your left side or your right side.  You may have one pillow under your head.  You may have pillows between your knees while you are on your side or under your knees while you are on your back.  2. DO NOT drive today.  Recline the seat as far back as it will go, while still wearing your seat belt, on the way home.  3. You may get up to go to the bathroom as needed.  You may sit up for 10 minutes to eat.  You may resume your normal diet and medications unless otherwise indicated.  Drink lots of extra fluids today and tomorrow.  4. The incidence of headache, nausea, or vomiting is about 5% (one in 20 patients).  If you develop a headache, lie flat and drink plenty of fluids until the headache goes away.  Caffeinated beverages may be helpful.  If you develop severe nausea and vomiting or a headache that does not go away with flat bed rest, call [phone].  5. You may resume normal activities after your 24 hours of bed rest is over; however, do not exert yourself strongly or do any heavy lifting tomorrow. If when you get up you have a headache when standing, go back to bed and force fluids for another 24 hours.  6. Call your physician for a follow-up appointment.  The results of your myelogram will be sent directly to your physician by the following day.  7. If you have any questions or if complications develop after you arrive home, please call [phone].  Discharge instructions have been explained to the patient.  The patient, or the person responsible for the patient, fully understands these instructions.      May resume Wellbutrin on Dec. 30, 2016, after 9:30 am.

## 2015-02-21 NOTE — Progress Notes (Signed)
Pt states he has been off Wellbutrin for the past 2 days.  Discharge instructions explained to pt and his wife

## 2015-02-26 MED FILL — CONTOUR NEXT STRIPS: 30 days supply | Qty: 150 | Fill #1

## 2015-03-06 MED FILL — TOUJEO SOLOSTAR 300 UNITS/M: 300 | 23 days supply | Qty: 9 | Fill #2

## 2015-03-06 MED FILL — LISINOPRIL 5 MG TABLET: 5 | 90 days supply | Qty: 90 | Fill #0

## 2015-03-14 MED FILL — BD PEN NDL NANO 32GX5/32: 32G X 4 MM | 25 days supply | Qty: 100 | Fill #1

## 2015-03-15 DIAGNOSIS — F17200 Nicotine dependence, unspecified, uncomplicated: Secondary | ICD-10-CM | POA: Diagnosis not present

## 2015-03-15 DIAGNOSIS — E1140 Type 2 diabetes mellitus with diabetic neuropathy, unspecified: Secondary | ICD-10-CM | POA: Diagnosis not present

## 2015-03-15 DIAGNOSIS — M542 Cervicalgia: Secondary | ICD-10-CM | POA: Diagnosis not present

## 2015-03-18 MED FILL — ATORVASTATIN 40 MG TABLET: 40 | 90 days supply | Qty: 90 | Fill #0

## 2015-03-19 ENCOUNTER — Encounter: Payer: Self-pay | Admitting: Pharmacist

## 2015-03-19 ENCOUNTER — Ambulatory Visit (INDEPENDENT_AMBULATORY_CARE_PROVIDER_SITE_OTHER): Payer: Self-pay | Admitting: Family Medicine

## 2015-03-19 VITALS — BP 140/66 | HR 51 | Ht 71.0 in | Wt 232.0 lb

## 2015-03-19 DIAGNOSIS — Z794 Long term (current) use of insulin: Secondary | ICD-10-CM

## 2015-03-19 DIAGNOSIS — E1136 Type 2 diabetes mellitus with diabetic cataract: Secondary | ICD-10-CM

## 2015-03-19 MED FILL — BUPROPION HCL SR 150 MG TAB: 150 | 30 days supply | Qty: 60 | Fill #2

## 2015-03-19 MED FILL — HUMALOG 200 UNITS/ML KWIKPE: 200 | 28 days supply | Qty: 36 | Fill #1

## 2015-03-19 MED FILL — PIOGLITAZONE HCL 30 MG TAB: 30 | 30 days supply | Qty: 30 | Fill #3

## 2015-03-19 MED FILL — GABAPENTIN 600 MG TABLET: 600 | 30 days supply | Qty: 90 | Fill #3

## 2015-03-19 NOTE — Progress Notes (Signed)
Subjective:  Patient presents today for 3 month diabetes follow-up as part of the employer-sponsored Link to Wellness program.  Current diabetes regimen includes Toujeo, Humalog and pioglitazone. Patient also continues on daily ASA, ACE Inhibitor and statin.  Most recent MD follow-up was 01/03/2015.  Patient has a pending appt for 04/01/15.  No med changes or major health changes at this time.    Assessment:  Diabetes: Most recent A1C was 8.8% which is at exceeding goal of less than 7%. Weight is increased from last visit with me.   Lifestyle improvements:  Physical Activity- Walking up and down driveway very regularly. Unable to maintain a fast pace, limited by back pain.   Nutrition- Continues to eat fairly healthfully with some occasional high carbohydrate meals.  Follow up with me in 3 months.    Plan/Goals for Next Visit:  1. Schedule dilated eye exam (it has been 1 year). 2. See Dwyane Dee on 04/04/15. 3. Schedule/follow-up regarding neck surgery.    Next appointment to see me is: April 25th @ 10AM

## 2015-03-22 DIAGNOSIS — M5412 Radiculopathy, cervical region: Secondary | ICD-10-CM | POA: Diagnosis not present

## 2015-03-25 ENCOUNTER — Other Ambulatory Visit (HOSPITAL_COMMUNITY): Payer: Self-pay | Admitting: Neurosurgery

## 2015-03-28 DIAGNOSIS — R490 Dysphonia: Secondary | ICD-10-CM | POA: Diagnosis not present

## 2015-03-29 NOTE — Progress Notes (Signed)
I have reviewed this pharmacist's note and agree  

## 2015-04-01 MED FILL — ALPRAZolam 2 MG TABS: 2 | 30 days supply | Qty: 90 | Fill #1

## 2015-04-01 MED FILL — TOUJEO SOLOSTAR 300 UNITS/M: 300 | 23 days supply | Qty: 9 | Fill #3

## 2015-04-02 ENCOUNTER — Other Ambulatory Visit (HOSPITAL_COMMUNITY): Payer: 59

## 2015-04-04 ENCOUNTER — Ambulatory Visit (INDEPENDENT_AMBULATORY_CARE_PROVIDER_SITE_OTHER): Payer: 59 | Admitting: Endocrinology

## 2015-04-04 ENCOUNTER — Other Ambulatory Visit (INDEPENDENT_AMBULATORY_CARE_PROVIDER_SITE_OTHER): Payer: 59

## 2015-04-04 ENCOUNTER — Encounter: Payer: Self-pay | Admitting: Endocrinology

## 2015-04-04 VITALS — BP 110/70 | HR 56 | Temp 97.9°F | Wt 227.0 lb

## 2015-04-04 DIAGNOSIS — E1165 Type 2 diabetes mellitus with hyperglycemia: Secondary | ICD-10-CM

## 2015-04-04 DIAGNOSIS — E1142 Type 2 diabetes mellitus with diabetic polyneuropathy: Secondary | ICD-10-CM

## 2015-04-04 DIAGNOSIS — E785 Hyperlipidemia, unspecified: Secondary | ICD-10-CM | POA: Diagnosis not present

## 2015-04-04 DIAGNOSIS — Z794 Long term (current) use of insulin: Secondary | ICD-10-CM

## 2015-04-04 DIAGNOSIS — IMO0002 Reserved for concepts with insufficient information to code with codable children: Secondary | ICD-10-CM

## 2015-04-04 LAB — URINALYSIS, ROUTINE W REFLEX MICROSCOPIC
Bilirubin Urine: NEGATIVE
HGB URINE DIPSTICK: NEGATIVE
Ketones, ur: NEGATIVE
Leukocytes, UA: NEGATIVE
Nitrite: NEGATIVE
Total Protein, Urine: NEGATIVE
Urine Glucose: NEGATIVE
Urobilinogen, UA: 0.2 (ref 0.0–1.0)
pH: 5.5 (ref 5.0–8.0)

## 2015-04-04 LAB — LIPID PANEL
CHOLESTEROL: 143 mg/dL (ref 0–200)
HDL: 28.9 mg/dL — AB (ref >39.00)
LDL CALC: 91 mg/dL (ref 0–99)
NonHDL: 114.58
Total CHOL/HDL Ratio: 5
Triglycerides: 119 mg/dL (ref 0.0–149.0)
VLDL: 23.8 mg/dL (ref 0.0–40.0)

## 2015-04-04 LAB — BASIC METABOLIC PANEL
BUN: 20 mg/dL (ref 6–23)
CALCIUM: 9.5 mg/dL (ref 8.4–10.5)
CHLORIDE: 103 meq/L (ref 96–112)
CO2: 29 meq/L (ref 19–32)
CREATININE: 1.01 mg/dL (ref 0.40–1.50)
GFR: 80.61 mL/min (ref >60.00)
Glucose, Bld: 117 mg/dL — ABNORMAL HIGH (ref 70–99)
Potassium: 4.2 mEq/L (ref 3.5–5.1)
Sodium: 137 mEq/L (ref 135–145)

## 2015-04-04 LAB — COMPREHENSIVE METABOLIC PANEL
ALK PHOS: 72 U/L (ref 39–117)
ALT: 42 U/L (ref 0–53)
AST: 31 U/L (ref 0–37)
Albumin: 4.2 g/dL (ref 3.5–5.2)
BILIRUBIN TOTAL: 1.2 mg/dL (ref 0.2–1.2)
BUN: 20 mg/dL (ref 6–23)
CALCIUM: 9.5 mg/dL (ref 8.4–10.5)
CO2: 29 mEq/L (ref 19–32)
Chloride: 103 mEq/L (ref 96–112)
Creatinine, Ser: 1.01 mg/dL (ref 0.40–1.50)
GFR: 80.61 mL/min (ref >60.00)
Glucose, Bld: 117 mg/dL — ABNORMAL HIGH (ref 70–99)
POTASSIUM: 4.2 meq/L (ref 3.5–5.1)
Sodium: 137 mEq/L (ref 135–145)
TOTAL PROTEIN: 7 g/dL (ref 6.0–8.3)

## 2015-04-04 LAB — MICROALBUMIN / CREATININE URINE RATIO
CREATININE, U: 192.8 mg/dL
MICROALB/CREAT RATIO: 1.3 mg/g (ref 0.0–30.0)
Microalb, Ur: 2.6 mg/dL — ABNORMAL HIGH (ref 0.0–1.9)

## 2015-04-04 LAB — POCT GLYCOSYLATED HEMOGLOBIN (HGB A1C): Hemoglobin A1C: 8.4

## 2015-04-04 NOTE — Progress Notes (Signed)
Pre visit review using our clinic review tool, if applicable. No additional management support is needed unless otherwise documented below in the visit note. 
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
## 2015-04-04 NOTE — Patient Instructions (Signed)
55 Humalog for toast and 60 for Oatmeal  35 at lunch for sandwich. Confirm 2 hour sugar   55 at supper  Must take while eating or before  Check blood sugars on waking up  4 times a week Also check blood sugars about 2 hours after a meal and do this after different meals by rotation  Recommended blood sugar levels on waking up is 90-130 and about 2 hours after meal is 130-160  Please bring your blood sugar monitor to each visit, thank you

## 2015-04-07 NOTE — Progress Notes (Signed)
Quick Note:  Please let patient know that the lab tests are fairly good except for low good cholesterol ______

## 2015-04-09 NOTE — Pre-Procedure Instructions (Signed)
Arthur Mata  04/09/2015      [facility] [place] [address] [place] [address] Phone: [phone] Fax: [phone]    Your procedure is scheduled on Wednesday, April 17, 2015  Report to [facility] Admitting at 6:30 A.M.  Call this number if you have problems the morning of surgery:  [phone]   Remember:  Do not eat food or drink liquids after midnight Tuesday, April 16, 2015  Take these medicines the morning of surgery with A SIP OF WATER: buPROPion (WELLBUTRIN),  digoxin (LANOXIN), metoprolol succinate (TOPROL-XL), if needed:nitroGLYCERIN (NITROSTAT) for chest pain, alprazolam Duanne Moron)  Stop taking Aspirin, vitamins, fish oil and herbal medications. Do not take any NSAIDs ie: Ibuprofen, Advil, Naproxen, BC and Goody Powder or any medication containing Aspirin; stop Thursday, April 11, 2015 How to Manage Your Diabetes Before Surgery Why is it important to control my blood sugar before and after surgery?   Improving blood sugar levels before and after surgery helps healing and can limit problems.  A way of improving blood sugar control is eating a healthy diet by:  - Eating less sugar and carbohydrates  - Increasing activity/exercise  - Talk with your doctor about reaching your blood sugar goals  High blood sugars (greater than 180 mg/dL) can raise your risk of infections and slow down your recovery so you will need to focus on controlling your diabetes during the weeks before surgery.  Make sure that the doctor who takes care of your diabetes knows about your planned surgery including the date and location.  How do I manage my blood sugars before surgery?   Check your blood sugar at least 4 times a day, 2 days before surgery to make sure that they are not too high or low.   Check your blood sugar the morning of your surgery when you wake up and every 2 hours until  you get to the Short-Stay unit.  If your blood sugar is less than 70 mg/dL, you will need to treat for low blood sugar by:  Treat a low blood sugar (less than 70 mg/dL) with 1/2 cup of clear juice (cranberry or apple), 4 glucose tablets, OR glucose gel.  Recheck blood sugar in 15 minutes after treatment (to make sure it is greater than 70 mg/dL).  If blood sugar is not greater than 70 mg/dL on re-check, call [phone] for further instructions.   Report your blood sugar to the Short-Stay nurse when you get to Short-Stay.  References:  [facility], 2007 "How to Manage your Diabetes Before and After Surgery".  What do I do about my diabetes medications?   Do not take oral diabetes medicines (pills) the morning of surgery such as pioglitazone (ACTOS)  THE MORNING OF SURGERY, take 60 units of Insulin Glargine (TOUJEO) Insulin.  Do not wear jewelry, make-up or nail polish.  Do not wear lotions, powders, or perfumes.  You may not wear deodorant.  Do not shave 48 hours prior to surgery.  Men may shave face and neck.  Do not bring valuables to the hospital.  [facility] is not responsible for any belongings or valuables.  Contacts, dentures or bridgework may not be worn into surgery.  Leave your suitcase in the car.  After surgery it may be brought to your room.  For patients admitted to the hospital, discharge time will be determined by  your treatment team.  Patients discharged the day of surgery will not be allowed to drive home.    Special instructions: Shower the night before surgery and the morning of surgery with CHG.  Please read over the following fact sheets that you were given. Pain Booklet, Coughing and Deep Breathing, MRSA Information and Surgical Site Infection Prevention

## 2015-04-10 ENCOUNTER — Inpatient Hospital Stay (HOSPITAL_COMMUNITY)
Admission: RE | Admit: 2015-04-10 | Discharge: 2015-04-10 | Disposition: A | Discharge status: Home / Self Care | Payer: 59 | Source: Ambulatory Visit

## 2015-04-10 NOTE — Progress Notes (Signed)
Not here for pat appointment called pt states he is on his way.

## 2015-04-10 NOTE — Progress Notes (Addendum)
Pt still not here for PAT appointment. Informed him that he will have to be rescheduled. He states he was told to come when ever he could, and that he is still in [place] [employer] now.Informed him we schedule appointments ever hour, and that I have another patient coming in at 1000. Informed him that we would call him to re schedule.

## 2015-04-11 ENCOUNTER — Telehealth: Payer: Self-pay | Admitting: Endocrinology

## 2015-04-11 ENCOUNTER — Encounter (HOSPITAL_COMMUNITY)
Admission: RE | Admit: 2015-04-11 | Discharge: 2015-04-11 | Disposition: A | Discharge status: Home / Self Care | Payer: 59 | Source: Ambulatory Visit | Attending: Neurosurgery | Admitting: Neurosurgery

## 2015-04-11 ENCOUNTER — Encounter (HOSPITAL_COMMUNITY): Payer: Self-pay

## 2015-04-11 DIAGNOSIS — Z79899 Other long term (current) drug therapy: Secondary | ICD-10-CM | POA: Diagnosis not present

## 2015-04-11 DIAGNOSIS — I10 Essential (primary) hypertension: Secondary | ICD-10-CM | POA: Diagnosis not present

## 2015-04-11 DIAGNOSIS — E119 Type 2 diabetes mellitus without complications: Secondary | ICD-10-CM | POA: Diagnosis not present

## 2015-04-11 DIAGNOSIS — J449 Chronic obstructive pulmonary disease, unspecified: Secondary | ICD-10-CM | POA: Diagnosis not present

## 2015-04-11 DIAGNOSIS — Z01812 Encounter for preprocedural laboratory examination: Secondary | ICD-10-CM | POA: Diagnosis not present

## 2015-04-11 DIAGNOSIS — Z7982 Long term (current) use of aspirin: Secondary | ICD-10-CM | POA: Diagnosis not present

## 2015-04-11 DIAGNOSIS — Z01818 Encounter for other preprocedural examination: Secondary | ICD-10-CM | POA: Diagnosis not present

## 2015-04-11 DIAGNOSIS — E785 Hyperlipidemia, unspecified: Secondary | ICD-10-CM | POA: Diagnosis not present

## 2015-04-11 DIAGNOSIS — I2510 Atherosclerotic heart disease of native coronary artery without angina pectoris: Secondary | ICD-10-CM | POA: Insufficient documentation

## 2015-04-11 DIAGNOSIS — Z7984 Long term (current) use of oral hypoglycemic drugs: Secondary | ICD-10-CM | POA: Insufficient documentation

## 2015-04-11 DIAGNOSIS — F17200 Nicotine dependence, unspecified, uncomplicated: Secondary | ICD-10-CM | POA: Insufficient documentation

## 2015-04-11 HISTORY — DX: Atherosclerotic heart disease of native coronary artery without angina pectoris: I25.10

## 2015-04-11 HISTORY — DX: Chronic obstructive pulmonary disease, unspecified: J44.9

## 2015-04-11 LAB — CBC
HEMATOCRIT: 46.5 % (ref 39.0–52.0)
HEMOGLOBIN: 15.8 g/dL (ref 13.0–17.0)
MCH: 30.8 pg (ref 26.0–34.0)
MCHC: 34 g/dL (ref 30.0–36.0)
MCV: 90.6 fL (ref 78.0–100.0)
Platelets: 189 10*3/uL (ref 150–400)
RBC: 5.13 MIL/uL (ref 4.22–5.81)
RDW: 12.5 % (ref 11.5–15.5)
WBC: 9.5 10*3/uL (ref 4.0–10.5)

## 2015-04-11 LAB — BASIC METABOLIC PANEL
ANION GAP: 9 (ref 5–15)
BUN: 10 mg/dL (ref 6–20)
CO2: 25 mmol/L (ref 22–32)
Calcium: 9.6 mg/dL (ref 8.9–10.3)
Chloride: 104 mmol/L (ref 101–111)
Creatinine, Ser: 1.03 mg/dL (ref 0.61–1.24)
GFR calc Af Amer: >60 mL/min (ref >60)
GLUCOSE: 154 mg/dL — AB (ref 65–99)
POTASSIUM: 4.5 mmol/L (ref 3.5–5.1)
Sodium: 138 mmol/L (ref 135–145)

## 2015-04-11 LAB — SURGICAL PCR SCREEN
MRSA, PCR: NEGATIVE
STAPHYLOCOCCUS AUREUS: NEGATIVE

## 2015-04-11 LAB — GLUCOSE, CAPILLARY: Glucose-Capillary: 161 mg/dL — ABNORMAL HIGH (ref 65–99)

## 2015-04-11 NOTE — Telephone Encounter (Signed)
Katharine Look from Dr Fulton Mole office called and asking  you to fax over patients lab results.  [phone]

## 2015-04-11 NOTE — Progress Notes (Addendum)
PCP is Dr Hali Marry Cardiologist is Dr Mare Ferrari- whom he saw last year. Denies ever having a card cath, but note from Dr Mare Ferrari says he had one in [place], [place] with Dr Sherryle Lis  Stress test noted form 03-12-14. Reports his fasting CBG's run 90-138

## 2015-04-11 NOTE — Telephone Encounter (Signed)
faxed

## 2015-04-12 DIAGNOSIS — I2510 Atherosclerotic heart disease of native coronary artery without angina pectoris: Secondary | ICD-10-CM | POA: Diagnosis not present

## 2015-04-12 DIAGNOSIS — M50122 Cervical disc disorder at C5-C6 level with radiculopathy: Secondary | ICD-10-CM | POA: Diagnosis not present

## 2015-04-12 DIAGNOSIS — I10 Essential (primary) hypertension: Secondary | ICD-10-CM | POA: Diagnosis not present

## 2015-04-12 DIAGNOSIS — Z794 Long term (current) use of insulin: Secondary | ICD-10-CM | POA: Diagnosis not present

## 2015-04-12 DIAGNOSIS — J449 Chronic obstructive pulmonary disease, unspecified: Secondary | ICD-10-CM | POA: Diagnosis not present

## 2015-04-12 DIAGNOSIS — E785 Hyperlipidemia, unspecified: Secondary | ICD-10-CM | POA: Diagnosis not present

## 2015-04-12 DIAGNOSIS — E119 Type 2 diabetes mellitus without complications: Secondary | ICD-10-CM | POA: Diagnosis not present

## 2015-04-12 DIAGNOSIS — F17210 Nicotine dependence, cigarettes, uncomplicated: Secondary | ICD-10-CM | POA: Diagnosis not present

## 2015-04-12 DIAGNOSIS — Z7982 Long term (current) use of aspirin: Secondary | ICD-10-CM | POA: Diagnosis not present

## 2015-04-12 LAB — HEMOGLOBIN A1C
Hgb A1c MFr Bld: 8.7 % — ABNORMAL HIGH (ref 4.8–5.6)
Mean Plasma Glucose: 203 mg/dL

## 2015-04-12 NOTE — Progress Notes (Signed)
Anesthesia Chart Review:  Pt is a 60 year old male scheduled for C5-6 ACDF on 04/17/2015 with Dr. Hal Neer.   Cardiologist is Dr. Darlin Coco, last office visit 04/13/14.   PMH includes: CAD, HTN, angina, DM, palpitations (sinus tachycardia vs SVT), hyperlipidemia, COPD. Current smoker. BMI 34.   Medications include: ASA, lipitor, digoxin, toujeo, humalog, lisinopril, metoprolol, pioglitazone.   Preoperative labs reviewed.  HgbA1c 8.7, glucose 154.   EKG 04/11/15: sinus bradycardia (57 bpm).  Nuclear stress test 03/13/14: Normal stress nuclear study. LV Ejection Fraction: 63%. LV Wall Motion: NL LV Function; NL Wall Motion  Cardiac cath 11/17/12 ([place], [place]): - LM patent - mid LAD 30% lesion - LCx prox 40% lesion - RCA: mid and distal 20-30% lesion - EF 65%  Reviewed case with Dr. Glennon Mac.   If no changes, I anticipate pt can proceed with surgery as scheduled.   Willeen Cass, FNP-BC [facility] Short Stay [facility]/Anesthesiology Phone: [phone] 04/12/2015 12:36 PM

## 2015-04-23 ENCOUNTER — Other Ambulatory Visit: Payer: Self-pay | Admitting: Endocrinology

## 2015-04-23 MED FILL — TOUJEO SOLOSTAR 300 UNITS/M: 300 | 22 days supply | Qty: 9 | Fill #0

## 2015-04-23 MED FILL — BD PEN NDL NANO 32GX5/32: 32G X 4 MM | 25 days supply | Qty: 100 | Fill #2

## 2015-04-25 ENCOUNTER — Encounter (HOSPITAL_COMMUNITY): Payer: Self-pay | Admitting: *Deleted

## 2015-04-25 NOTE — Progress Notes (Signed)
Pt had a PAT appt on 04/11/15, surgery was rescheduled for 04/26/15. Pt verified that nothing has changed with his medications, allergies, medical and surgical history. Pt informed that he takes all of his medications in the evening. Instructed pt to take 96 units (80%) of Toujeo this evening. Instructed pt to check his blood sugar in the AM and if it is 70 or below to use his glucose tablets and then to check his blood sugar 15 minutes later. Instructed him to call Short Stay if blood sugar continues to be 70 or below. He voiced understanding. These instructions given per Diabetes Medication Adjustment Guidelines Prior to Procedure and Surgery.

## 2015-04-26 ENCOUNTER — Ambulatory Visit (HOSPITAL_COMMUNITY): Payer: 59 | Admitting: Certified Registered Nurse Anesthetist

## 2015-04-26 ENCOUNTER — Encounter (HOSPITAL_COMMUNITY): Payer: Self-pay | Admitting: *Deleted

## 2015-04-26 ENCOUNTER — Encounter (HOSPITAL_COMMUNITY)
Admission: RE | Disposition: A | Discharge status: Home / Self Care | Payer: Self-pay | Source: Ambulatory Visit | Attending: Neurological Surgery

## 2015-04-26 ENCOUNTER — Ambulatory Visit (HOSPITAL_COMMUNITY): Payer: 59 | Admitting: Emergency Medicine

## 2015-04-26 ENCOUNTER — Observation Stay (HOSPITAL_COMMUNITY)
Admission: RE | Admit: 2015-04-26 | Discharge: 2015-04-27 | Disposition: A | Discharge status: Home / Self Care | Payer: 59 | Source: Ambulatory Visit | Attending: Neurological Surgery | Admitting: Neurological Surgery

## 2015-04-26 ENCOUNTER — Ambulatory Visit (HOSPITAL_COMMUNITY): Payer: 59

## 2015-04-26 DIAGNOSIS — F17210 Nicotine dependence, cigarettes, uncomplicated: Secondary | ICD-10-CM | POA: Diagnosis not present

## 2015-04-26 DIAGNOSIS — M4322 Fusion of spine, cervical region: Secondary | ICD-10-CM | POA: Diagnosis not present

## 2015-04-26 DIAGNOSIS — E119 Type 2 diabetes mellitus without complications: Secondary | ICD-10-CM | POA: Insufficient documentation

## 2015-04-26 DIAGNOSIS — I10 Essential (primary) hypertension: Secondary | ICD-10-CM | POA: Diagnosis not present

## 2015-04-26 DIAGNOSIS — M5412 Radiculopathy, cervical region: Secondary | ICD-10-CM | POA: Diagnosis present

## 2015-04-26 DIAGNOSIS — M50122 Cervical disc disorder at C5-C6 level with radiculopathy: Principal | ICD-10-CM | POA: Insufficient documentation

## 2015-04-26 DIAGNOSIS — E785 Hyperlipidemia, unspecified: Secondary | ICD-10-CM | POA: Insufficient documentation

## 2015-04-26 DIAGNOSIS — I2510 Atherosclerotic heart disease of native coronary artery without angina pectoris: Secondary | ICD-10-CM | POA: Insufficient documentation

## 2015-04-26 DIAGNOSIS — Z7982 Long term (current) use of aspirin: Secondary | ICD-10-CM | POA: Insufficient documentation

## 2015-04-26 DIAGNOSIS — J449 Chronic obstructive pulmonary disease, unspecified: Secondary | ICD-10-CM | POA: Insufficient documentation

## 2015-04-26 DIAGNOSIS — M4806 Spinal stenosis, lumbar region: Secondary | ICD-10-CM | POA: Diagnosis not present

## 2015-04-26 DIAGNOSIS — Z794 Long term (current) use of insulin: Secondary | ICD-10-CM | POA: Diagnosis not present

## 2015-04-26 DIAGNOSIS — M4722 Other spondylosis with radiculopathy, cervical region: Secondary | ICD-10-CM | POA: Diagnosis not present

## 2015-04-26 DIAGNOSIS — Z419 Encounter for procedure for purposes other than remedying health state, unspecified: Secondary | ICD-10-CM

## 2015-04-26 HISTORY — PX: ANTERIOR CERVICAL DECOMP/DISCECTOMY FUSION: SHX1161

## 2015-04-26 LAB — BASIC METABOLIC PANEL
ANION GAP: 9 (ref 5–15)
BUN: 12 mg/dL (ref 6–20)
CALCIUM: 9.2 mg/dL (ref 8.9–10.3)
CO2: 23 mmol/L (ref 22–32)
Chloride: 107 mmol/L (ref 101–111)
Creatinine, Ser: 0.93 mg/dL (ref 0.61–1.24)
GFR calc Af Amer: >60 mL/min (ref >60)
GLUCOSE: 60 mg/dL — AB (ref 65–99)
Potassium: 4.4 mmol/L (ref 3.5–5.1)
Sodium: 139 mmol/L (ref 135–145)

## 2015-04-26 LAB — GLUCOSE, CAPILLARY
GLUCOSE-CAPILLARY: 85 mg/dL (ref 65–99)
GLUCOSE-CAPILLARY: 69 mg/dL (ref 65–99)
Glucose-Capillary: 88 mg/dL (ref 65–99)
Glucose-Capillary: 73 mg/dL (ref 65–99)

## 2015-04-26 LAB — CBC
HEMATOCRIT: 41.4 % (ref 39.0–52.0)
HEMOGLOBIN: 14.3 g/dL (ref 13.0–17.0)
MCH: 31.6 pg (ref 26.0–34.0)
MCHC: 34.5 g/dL (ref 30.0–36.0)
MCV: 91.4 fL (ref 78.0–100.0)
Platelets: 180 10*3/uL (ref 150–400)
RBC: 4.53 MIL/uL (ref 4.22–5.81)
RDW: 12.8 % (ref 11.5–15.5)
WBC: 9 10*3/uL (ref 4.0–10.5)

## 2015-04-26 SURGERY — ANTERIOR CERVICAL DECOMPRESSION/DISCECTOMY FUSION 1 LEVEL
Anesthesia: General

## 2015-04-26 MED ORDER — MEPERIDINE HCL 25 MG/ML IJ SOLN: 6.2500 mg | INTRAMUSCULAR | Status: DC | PRN

## 2015-04-26 MED ORDER — MENTHOL 3 MG MT LOZG: 1.0000 | LOZENGE | OROMUCOSAL | Status: DC | PRN

## 2015-04-26 MED ORDER — FENTANYL CITRATE (PF) 250 MCG/5ML IJ SOLN
INTRAMUSCULAR | Status: AC
  Filled 2015-04-26: qty 5

## 2015-04-26 MED ORDER — ACETAMINOPHEN 325 MG PO TABS: 650.0000 mg | ORAL_TABLET | ORAL | Status: DC | PRN

## 2015-04-26 MED ORDER — LIDOCAINE-EPINEPHRINE 1 %-1:100000 IJ SOLN
INTRAMUSCULAR | Status: DC | PRN
  Administered 2015-04-26: 9 mL

## 2015-04-26 MED ORDER — SODIUM CHLORIDE 0.9% FLUSH: 3.0000 mL | Freq: Two times a day (BID) | INTRAVENOUS | Status: DC

## 2015-04-26 MED ORDER — METHOCARBAMOL 1000 MG/10ML IJ SOLN
500.0000 mg | Freq: Four times a day (QID) | INTRAVENOUS | Status: DC | PRN
  Filled 2015-04-26: qty 5

## 2015-04-26 MED ORDER — PROPOFOL 10 MG/ML IV BOLUS
INTRAVENOUS | Status: AC
  Filled 2015-04-26: qty 20

## 2015-04-26 MED ORDER — FENTANYL CITRATE (PF) 100 MCG/2ML IJ SOLN
INTRAMUSCULAR | Status: DC | PRN
Start: 2015-04-26 — End: 2015-04-26
  Administered 2015-04-26: 50 ug via INTRAVENOUS
  Administered 2015-04-26: 100 ug via INTRAVENOUS
  Administered 2015-04-26: 50 ug via INTRAVENOUS
  Administered 2015-04-26: 100 ug via INTRAVENOUS

## 2015-04-26 MED ORDER — SODIUM CHLORIDE 0.9 % IR SOLN
Status: DC | PRN
  Administered 2015-04-26: 14:00:00

## 2015-04-26 MED ORDER — EPHEDRINE SULFATE 50 MG/ML IJ SOLN
INTRAMUSCULAR | Status: DC | PRN
  Administered 2015-04-26: 10 mg via INTRAVENOUS
  Administered 2015-04-26: 15 mg via INTRAVENOUS
  Administered 2015-04-26: 10 mg via INTRAVENOUS

## 2015-04-26 MED ORDER — PROPOFOL 10 MG/ML IV BOLUS
INTRAVENOUS | Status: DC | PRN
  Administered 2015-04-26: 150 mg via INTRAVENOUS

## 2015-04-26 MED ORDER — GABAPENTIN 600 MG PO TABS
600.0000 mg | ORAL_TABLET | Freq: Every day | ORAL | Status: DC
  Administered 2015-04-26: 600 mg via ORAL
  Filled 2015-04-26: qty 1

## 2015-04-26 MED ORDER — HEMOSTATIC AGENTS (NO CHARGE) OPTIME
TOPICAL | Status: DC | PRN
  Administered 2015-04-26: 1 via TOPICAL

## 2015-04-26 MED ORDER — THROMBIN 5000 UNITS EX SOLR
CUTANEOUS | Status: DC | PRN
  Administered 2015-04-26 (×2): 5000 [IU] via TOPICAL

## 2015-04-26 MED ORDER — KETOROLAC TROMETHAMINE 15 MG/ML IJ SOLN: 15.0000 mg | Freq: Once | INTRAMUSCULAR | Status: DC

## 2015-04-26 MED ORDER — SUGAMMADEX SODIUM 200 MG/2ML IV SOLN
INTRAVENOUS | Status: DC | PRN
  Administered 2015-04-26: 200 mg via INTRAVENOUS

## 2015-04-26 MED ORDER — SUGAMMADEX SODIUM 200 MG/2ML IV SOLN
INTRAVENOUS | Status: AC
  Filled 2015-04-26: qty 2

## 2015-04-26 MED ORDER — OXYCODONE-ACETAMINOPHEN 5-325 MG PO TABS
1.0000 | ORAL_TABLET | ORAL | Status: DC | PRN
  Administered 2015-04-26 – 2015-04-27 (×2): 2 via ORAL
  Filled 2015-04-26 (×2): qty 2

## 2015-04-26 MED ORDER — DIAZEPAM 5 MG/ML IJ SOLN
INTRAMUSCULAR | Status: AC
  Administered 2015-04-26 (×2): 1 mg
  Filled 2015-04-26: qty 2

## 2015-04-26 MED ORDER — ONDANSETRON HCL 4 MG/2ML IJ SOLN
INTRAMUSCULAR | Status: DC | PRN
  Administered 2015-04-26: 4 mg via INTRAVENOUS

## 2015-04-26 MED ORDER — LIDOCAINE HCL (CARDIAC) 20 MG/ML IV SOLN
INTRAVENOUS | Status: DC | PRN
  Administered 2015-04-26: 80 mg via INTRAVENOUS

## 2015-04-26 MED ORDER — PHENOL 1.4 % MT LIQD
1.0000 | OROMUCOSAL | Status: DC | PRN
  Filled 2015-04-26: qty 177

## 2015-04-26 MED ORDER — ATORVASTATIN CALCIUM 20 MG PO TABS
40.0000 mg | ORAL_TABLET | Freq: Every evening | ORAL | Status: DC
  Administered 2015-04-26: 40 mg via ORAL
  Filled 2015-04-26: qty 2

## 2015-04-26 MED ORDER — PIOGLITAZONE HCL 15 MG PO TABS: 30.0000 mg | ORAL_TABLET | Freq: Every evening | ORAL | Status: DC

## 2015-04-26 MED ORDER — BUPIVACAINE HCL (PF) 0.25 % IJ SOLN
INTRAMUSCULAR | Status: DC | PRN
  Administered 2015-04-26: 9 mL

## 2015-04-26 MED ORDER — METHOCARBAMOL 500 MG PO TABS
500.0000 mg | ORAL_TABLET | Freq: Four times a day (QID) | ORAL | Status: DC | PRN
  Administered 2015-04-27: 500 mg via ORAL
  Filled 2015-04-26: qty 1

## 2015-04-26 MED ORDER — LACTATED RINGERS IV SOLN: INTRAVENOUS | Status: DC

## 2015-04-26 MED ORDER — 0.9 % SODIUM CHLORIDE (POUR BTL) OPTIME
TOPICAL | Status: DC | PRN
  Administered 2015-04-26: 1000 mL

## 2015-04-26 MED ORDER — PHENYLEPHRINE HCL 10 MG/ML IJ SOLN
10.0000 mg | INTRAVENOUS | Status: DC | PRN
  Administered 2015-04-26: 25 ug/min via INTRAVENOUS

## 2015-04-26 MED ORDER — HYDROMORPHONE HCL 1 MG/ML IJ SOLN
INTRAMUSCULAR | Status: AC
  Filled 2015-04-26: qty 1

## 2015-04-26 MED ORDER — CEFAZOLIN SODIUM-DEXTROSE 2-3 GM-% IV SOLR
2.0000 g | INTRAVENOUS | Status: AC
  Administered 2015-04-26: 2 g via INTRAVENOUS

## 2015-04-26 MED ORDER — ONDANSETRON HCL 4 MG/2ML IJ SOLN: 4.0000 mg | INTRAMUSCULAR | Status: DC | PRN

## 2015-04-26 MED ORDER — LISINOPRIL 5 MG PO TABS
5.0000 mg | ORAL_TABLET | Freq: Every evening | ORAL | Status: DC
  Administered 2015-04-26: 5 mg via ORAL
  Filled 2015-04-26: qty 1

## 2015-04-26 MED ORDER — INSULIN GLARGINE 100 UNIT/ML ~~LOC~~ SOLN
120.0000 [IU] | Freq: Every morning | SUBCUTANEOUS | Status: DC
  Filled 2015-04-26: qty 1.2

## 2015-04-26 MED ORDER — SODIUM CHLORIDE 0.9 % IJ SOLN
INTRAMUSCULAR | Status: AC
  Filled 2015-04-26: qty 10

## 2015-04-26 MED ORDER — SODIUM CHLORIDE 0.9% FLUSH: 3.0000 mL | INTRAVENOUS | Status: DC | PRN

## 2015-04-26 MED ORDER — LIDOCAINE HCL (CARDIAC) 20 MG/ML IV SOLN
INTRAVENOUS | Status: AC
  Filled 2015-04-26: qty 5

## 2015-04-26 MED ORDER — HYDROCODONE-ACETAMINOPHEN 5-325 MG PO TABS: 1.0000 | ORAL_TABLET | ORAL | Status: DC | PRN

## 2015-04-26 MED ORDER — ALUM & MAG HYDROXIDE-SIMETH 200-200-20 MG/5ML PO SUSP: 30.0000 mL | Freq: Four times a day (QID) | ORAL | Status: DC | PRN

## 2015-04-26 MED ORDER — ACETAMINOPHEN 650 MG RE SUPP: 650.0000 mg | RECTAL | Status: DC | PRN

## 2015-04-26 MED ORDER — MIDAZOLAM HCL 2 MG/2ML IJ SOLN
INTRAMUSCULAR | Status: AC
  Filled 2015-04-26: qty 2

## 2015-04-26 MED ORDER — PROMETHAZINE HCL 25 MG/ML IJ SOLN: 6.2500 mg | INTRAMUSCULAR | Status: DC | PRN

## 2015-04-26 MED ORDER — INSULIN ASPART 100 UNIT/ML ~~LOC~~ SOLN: 50.0000 [IU] | Freq: Two times a day (BID) | SUBCUTANEOUS | Status: DC

## 2015-04-26 MED ORDER — EPHEDRINE SULFATE 50 MG/ML IJ SOLN
INTRAMUSCULAR | Status: AC
  Filled 2015-04-26: qty 1

## 2015-04-26 MED ORDER — LACTATED RINGERS IV SOLN
INTRAVENOUS | Status: DC
  Administered 2015-04-26 (×3): via INTRAVENOUS

## 2015-04-26 MED ORDER — ONDANSETRON HCL 4 MG/2ML IJ SOLN
INTRAMUSCULAR | Status: AC
  Filled 2015-04-26: qty 2

## 2015-04-26 MED ORDER — INSULIN LISPRO 200 UNIT/ML ~~LOC~~ SOPN: 30.0000 [IU] | PEN_INJECTOR | Freq: Three times a day (TID) | SUBCUTANEOUS | Status: DC

## 2015-04-26 MED ORDER — HYDROMORPHONE HCL 1 MG/ML IJ SOLN
INTRAMUSCULAR | Status: AC
  Administered 2015-04-26: 0.5 mg via INTRAVENOUS
  Filled 2015-04-26: qty 1

## 2015-04-26 MED ORDER — ALPRAZOLAM 0.5 MG PO TABS
2.0000 mg | ORAL_TABLET | Freq: Three times a day (TID) | ORAL | Status: DC | PRN
  Administered 2015-04-27: 2 mg via ORAL
  Filled 2015-04-26: qty 4

## 2015-04-26 MED ORDER — INSULIN ASPART 100 UNIT/ML ~~LOC~~ SOLN: 30.0000 [IU] | Freq: Every day | SUBCUTANEOUS | Status: DC

## 2015-04-26 MED ORDER — MORPHINE SULFATE (PF) 2 MG/ML IV SOLN: 1.0000 mg | INTRAVENOUS | Status: DC | PRN

## 2015-04-26 MED ORDER — OXYCODONE-ACETAMINOPHEN 5-325 MG PO TABS: 1.0000 | ORAL_TABLET | ORAL | Status: DC | PRN

## 2015-04-26 MED ORDER — METOPROLOL SUCCINATE ER 25 MG PO TB24
25.0000 mg | ORAL_TABLET | Freq: Every day | ORAL | Status: DC
  Administered 2015-04-26: 25 mg via ORAL
  Filled 2015-04-26: qty 1

## 2015-04-26 MED ORDER — DIGOXIN 250 MCG PO TABS
0.2500 mg | ORAL_TABLET | Freq: Every evening | ORAL | Status: DC
  Administered 2015-04-26: 0.25 mg via ORAL
  Filled 2015-04-26: qty 1

## 2015-04-26 MED ORDER — ROCURONIUM BROMIDE 50 MG/5ML IV SOLN
INTRAVENOUS | Status: AC
  Filled 2015-04-26: qty 1

## 2015-04-26 MED ORDER — PHENYLEPHRINE 40 MCG/ML (10ML) SYRINGE FOR IV PUSH (FOR BLOOD PRESSURE SUPPORT)
PREFILLED_SYRINGE | INTRAVENOUS | Status: AC
Start: 2015-04-26 — End: 2015-04-26
  Filled 2015-04-26: qty 10

## 2015-04-26 MED ORDER — KETOROLAC TROMETHAMINE 30 MG/ML IJ SOLN
INTRAMUSCULAR | Status: AC
Start: 2015-04-26 — End: 2015-04-26
  Administered 2015-04-26: 15 mg
  Filled 2015-04-26: qty 1

## 2015-04-26 MED ORDER — HYDROMORPHONE HCL 1 MG/ML IJ SOLN
0.2500 mg | INTRAMUSCULAR | Status: DC | PRN
  Administered 2015-04-26 (×3): 0.5 mg via INTRAVENOUS

## 2015-04-26 MED ORDER — DIAZEPAM 5 MG PO TABS: 5.0000 mg | ORAL_TABLET | Freq: Four times a day (QID) | ORAL | Status: DC | PRN

## 2015-04-26 MED ORDER — MIDAZOLAM HCL 5 MG/5ML IJ SOLN
INTRAMUSCULAR | Status: DC | PRN
  Administered 2015-04-26: 2 mg via INTRAVENOUS

## 2015-04-26 MED ORDER — BUPROPION HCL ER (SR) 150 MG PO TB12
150.0000 mg | ORAL_TABLET | Freq: Two times a day (BID) | ORAL | Status: DC
  Administered 2015-04-26: 150 mg via ORAL
  Filled 2015-04-26: qty 1

## 2015-04-26 MED ORDER — ROCURONIUM BROMIDE 100 MG/10ML IV SOLN
INTRAVENOUS | Status: DC | PRN
  Administered 2015-04-26: 10 mg via INTRAVENOUS
  Administered 2015-04-26: 40 mg via INTRAVENOUS

## 2015-04-26 MED ORDER — PHENYLEPHRINE HCL 10 MG/ML IJ SOLN
INTRAMUSCULAR | Status: DC | PRN
  Administered 2015-04-26: 80 ug via INTRAVENOUS

## 2015-04-26 MED ORDER — ACETAMINOPHEN 10 MG/ML IV SOLN
INTRAVENOUS | Status: AC
  Administered 2015-04-26: 1000 mg
  Filled 2015-04-26: qty 100

## 2015-04-26 SURGICAL SUPPLY — 60 items
BAG DECANTER FOR FLEXI CONT (MISCELLANEOUS) ×3 IMPLANT
BIT DRILL NEURO 2X3.1 SFT TUCH (MISCELLANEOUS) ×1 IMPLANT
BNDG GAUZE ELAST 4 BULKY (GAUZE/BANDAGES/DRESSINGS) IMPLANT
BUR BARREL STRAIGHT FLUTE 4.0 (BURR) ×3 IMPLANT
CANISTER SUCT 3000ML PPV (MISCELLANEOUS) ×3 IMPLANT
CAP LOCKING ALTA G2 TI (Cap) ×9 IMPLANT
CAP LOCKING TI 1.50MM HI ANGLE (Cap) ×3 IMPLANT
DECANTER SPIKE VIAL GLASS SM (MISCELLANEOUS) ×3 IMPLANT
DERMABOND ADVANCED (GAUZE/BANDAGES/DRESSINGS) ×2
DERMABOND ADVANCED .7 DNX12 (GAUZE/BANDAGES/DRESSINGS) ×1 IMPLANT
DEVICE DISSECT PLASMABLAD 3.0S (MISCELLANEOUS) ×1 IMPLANT
DRAPE LAPAROTOMY 100X72 PEDS (DRAPES) ×3 IMPLANT
DRAPE MICROSCOPE LEICA (MISCELLANEOUS) IMPLANT
DRAPE POUCH INSTRU U-SHP 10X18 (DRAPES) ×3 IMPLANT
DRILL NEURO 2X3.1 SOFT TOUCH (MISCELLANEOUS) ×3
DURAPREP 6ML APPLICATOR 50/CS (WOUND CARE) ×3 IMPLANT
ELECT REM PT RETURN 9FT ADLT (ELECTROSURGICAL) ×3
ELECTRODE REM PT RTRN 9FT ADLT (ELECTROSURGICAL) ×1 IMPLANT
GAUZE SPONGE 4X4 16PLY XRAY LF (GAUZE/BANDAGES/DRESSINGS) IMPLANT
GLOVE BIO SURGEON STRL SZ7.5 (GLOVE) IMPLANT
GLOVE BIOGEL PI IND STRL 7.0 (GLOVE) ×3 IMPLANT
GLOVE BIOGEL PI IND STRL 7.5 (GLOVE) IMPLANT
GLOVE BIOGEL PI IND STRL 8.5 (GLOVE) ×1 IMPLANT
GLOVE BIOGEL PI INDICATOR 7.0 (GLOVE) ×6
GLOVE BIOGEL PI INDICATOR 7.5 (GLOVE)
GLOVE BIOGEL PI INDICATOR 8.5 (GLOVE) ×2
GLOVE ECLIPSE 6.5 STRL STRAW (GLOVE) ×3 IMPLANT
GLOVE ECLIPSE 8.5 STRL (GLOVE) ×3 IMPLANT
GLOVE EXAM NITRILE LRG STRL (GLOVE) IMPLANT
GLOVE EXAM NITRILE MD LF STRL (GLOVE) IMPLANT
GLOVE EXAM NITRILE XL STR (GLOVE) IMPLANT
GLOVE EXAM NITRILE XS STR PU (GLOVE) IMPLANT
GOWN STRL REUS W/ TWL LRG LVL3 (GOWN DISPOSABLE) IMPLANT
GOWN STRL REUS W/ TWL XL LVL3 (GOWN DISPOSABLE) ×1 IMPLANT
GOWN STRL REUS W/TWL 2XL LVL3 (GOWN DISPOSABLE) ×3 IMPLANT
GOWN STRL REUS W/TWL LRG LVL3 (GOWN DISPOSABLE)
GOWN STRL REUS W/TWL XL LVL3 (GOWN DISPOSABLE) ×2
HALF PLATE CRANIAL TI 7MM (Plate) ×2 IMPLANT
HALTER HD/CHIN CERV TRACTION D (MISCELLANEOUS) ×3 IMPLANT
KIT BASIN OR (CUSTOM PROCEDURE TRAY) ×3 IMPLANT
KIT ROOM TURNOVER OR (KITS) ×3 IMPLANT
NEEDLE HYPO 22GX1.5 SAFETY (NEEDLE) ×3 IMPLANT
NEEDLE SPNL 22GX3.5 QUINCKE BK (NEEDLE) ×3 IMPLANT
NS IRRIG 1000ML POUR BTL (IV SOLUTION) ×3 IMPLANT
PACK LAMINECTOMY NEURO (CUSTOM PROCEDURE TRAY) ×3 IMPLANT
PAD ARMBOARD 7.5X6 YLW CONV (MISCELLANEOUS) ×9 IMPLANT
PEEK ACDF 5DEG 14X17X7MM (Peek) ×2 IMPLANT
PLASMABLADE 3.0S (MISCELLANEOUS) ×3
PUTTY BONE GRAFT KIT 2.5ML (Bone Implant) ×3 IMPLANT
RUBBERBAND STERILE (MISCELLANEOUS) IMPLANT
SCREW S/D VAR TI 3.5X14MM G2 (Screw) ×3 IMPLANT
SCREW S/D VAR TI 3.5X16MM G3 (Screw) ×9 IMPLANT
SPONGE INTESTINAL PEANUT (DISPOSABLE) ×3 IMPLANT
SPONGE SURGIFOAM ABS GEL SZ50 (HEMOSTASIS) ×3 IMPLANT
SUT VIC AB 3-0 SH 8-18 (SUTURE) ×3 IMPLANT
TOWEL OR 17X24 6PK STRL BLUE (TOWEL DISPOSABLE) ×3 IMPLANT
TOWEL OR 17X26 10 PK STRL BLUE (TOWEL DISPOSABLE) ×3 IMPLANT
TUBE CONNECTING 12'X1/4 (SUCTIONS) ×3
TUBE CONNECTING 12X1/4 (SUCTIONS) ×6 IMPLANT
WATER STERILE IRR 1000ML POUR (IV SOLUTION) ×3 IMPLANT

## 2015-04-26 NOTE — Anesthesia Procedure Notes (Signed)
Procedure Name: Intubation Date/Time: 04/26/2015 1:20 PM Performed by: Trixie Deis A Pre-anesthesia Checklist: Patient identified, Timeout performed, Emergency Drugs available, Suction available and Patient being monitored Patient Re-evaluated:Patient Re-evaluated prior to inductionOxygen Delivery Method: Circle system utilized Preoxygenation: Pre-oxygenation with 100% oxygen Intubation Type: IV induction Ventilation: Mask ventilation without difficulty and Oral airway inserted - appropriate to patient size Laryngoscope Size: Glidescope and 4 Grade View: Grade I Tube type: Oral Tube size: 7.5 mm Number of attempts: 1 Airway Equipment and Method: Rigid stylet Placement Confirmation: ETT inserted through vocal cords under direct vision,  positive ETCO2 and breath sounds checked- equal and bilateral Secured at: 23 cm Tube secured with: Tape Dental Injury: Teeth and Oropharynx as per pre-operative assessment

## 2015-04-26 NOTE — H&P (Signed)
Arthur Mata is an 60 y.o. male.   Chief Complaint: Neck pain shoulder pain right arm pain and weakness  HPI: Patient is a 60 year old individual who's had significant neck shoulder and right arm pain with weakness. He has evidence of a large extruded fragment of disc at C5-C6 and adjacent level to previous fusion. He is failed efforts seconds service management. He's been advised regarding the need for surgical decompression arthrodesis via an anterior approach. He is now taken to the operating room to undergo anterior decompression arthrodesis at C5-C6.  Past Medical History  Diagnosis Date  . Diabetes mellitus without complication ([place])   . Angina pectoris ([place])   . Palpitations   . Chest pain   . Hyperlipidemia   . Hypertension   . Near syncope   . Bone spur of left foot   . Obesity   . Mumps   . Measles   . Coronary artery disease   . COPD (chronic obstructive pulmonary disease) ([place])   . Headache     Past Surgical History  Procedure Laterality Date  . Spine surgery  03/21/13    Lumbar fusion  . Anterior cervical discectomy      Family History  Problem Relation Age of Onset  . Diabetes Mother   . Diabetes Father   . Heart disease Father   . Diabetes Brother   . Hyperlipidemia Other   . Hypertension Other    Social History:  reports that he has been smoking Cigarettes.  He started smoking about 14 months ago. He has a 6.25 pack-year smoking history. He has never used smokeless tobacco. He reports that he drinks alcohol. He reports that he does not use illicit drugs.  Allergies:  Allergies  Allergen Reactions  . Prednisone Other (See Comments)    Run sugar over 600 causes hospitalization    Medications Prior to Admission  Medication Sig Dispense Refill  . alprazolam (XANAX) 2 MG tablet Take 2 mg by mouth 3 (three) times daily as needed.   5  . aspirin 81 MG tablet Take 81 mg by mouth every evening.     Marland Kitchen atorvastatin (LIPITOR) 40 MG tablet Take 40 mg by mouth  every evening.   3  . buPROPion (WELLBUTRIN SR) 150 MG 12 hr tablet Take 150 mg by mouth 2 (two) times daily.   10  . digoxin (LANOXIN) 0.25 MG tablet Take 1 tablet (0.25 mg total) by mouth daily. (Patient taking differently: Take 0.25 mg by mouth every evening. ) 90 tablet 1  . gabapentin (NEURONTIN) 600 MG tablet Take 600 mg by mouth at bedtime.   3  . Insulin Lispro, Human, (HUMALOG KWIKPEN) 200 UNIT/ML SOPN Inject 130 Units into the skin 3 (three) times daily. Inject 50 units at breakfast, 30 units at lunch and 50 units at dinner (Patient taking differently: Inject 30-50 Units into the skin 3 (three) times daily. Inject 50 units at breakfast, 30 units at lunch and 50 units at dinner) 45 mL 3  . Insulin Pen Needle (BD PEN NEEDLE NANO U/F) 32G X 4 MM MISC Use to inject insulin 4 times daily as instructed. 150 each 3  . lisinopril (PRINIVIL,ZESTRIL) 5 MG tablet Take 5 mg by mouth every evening.   3  . metoprolol succinate (TOPROL-XL) 25 MG 24 hr tablet Take 25 mg by mouth every evening.     . nitroGLYCERIN (NITROSTAT) 0.4 MG SL tablet Place 1 tablet (0.4 mg total) under the tongue every 5 (five) minutes  as needed for chest pain. 90 tablet 3  . pioglitazone (ACTOS) 30 MG tablet Take 1 tablet (30 mg total) by mouth daily. (Patient taking differently: Take 30 mg by mouth every evening. ) 30 tablet 3  . TOUJEO SOLOSTAR 300 UNIT/ML SOPN INJECT 120 UNITS INTO THE SKIN DAILY BEFORE BREAKFAST. 9 mL 3  . BAYER CONTOUR NEXT TEST test strip USE TO CHECK BLOOD SUGAR 5 TIMES PER DAY 150 each 3    Results for orders placed or performed during the hospital encounter of 04/26/15 (from the past 48 hour(s))  Glucose, capillary     Status: None   Collection Time: 04/26/15  9:23 AM  Result Value Ref Range   Glucose-Capillary 88 65 - 99 mg/dL  Glucose, capillary     Status: None   Collection Time: 04/26/15 10:28 AM  Result Value Ref Range   Glucose-Capillary 85 65 - 99 mg/dL  Basic metabolic panel     Status:  Abnormal   Collection Time: 04/26/15 12:19 PM  Result Value Ref Range   Sodium 139 135 - 145 mmol/L   Potassium 4.4 3.5 - 5.1 mmol/L   Chloride 107 101 - 111 mmol/L   CO2 23 22 - 32 mmol/L   Glucose, Bld 60 (L) 65 - 99 mg/dL   BUN 12 6 - 20 mg/dL   Creatinine, Ser 0.93 0.61 - 1.24 mg/dL   Calcium 9.2 8.9 - 10.3 mg/dL   GFR calc non Af Amer >60 >60 mL/min   GFR calc Af Amer >60 >60 mL/min    Comment: (NOTE) The eGFR has been calculated using the CKD EPI equation. This calculation has not been validated in all clinical situations. eGFR's persistently <60 mL/min signify possible Chronic Kidney Disease.    Anion gap 9 5 - 15  CBC     Status: None   Collection Time: 04/26/15 12:19 PM  Result Value Ref Range   WBC 9.0 4.0 - 10.5 K/uL   RBC 4.53 4.22 - 5.81 MIL/uL   Hemoglobin 14.3 13.0 - 17.0 g/dL   HCT 41.4 39.0 - 52.0 %   MCV 91.4 78.0 - 100.0 fL   MCH 31.6 26.0 - 34.0 pg   MCHC 34.5 30.0 - 36.0 g/dL   RDW 12.8 11.5 - 15.5 %   Platelets 180 150 - 400 K/uL   No results found.  Review of Systems  Constitutional: Negative.   Eyes: Negative.   Cardiovascular: Negative.   Gastrointestinal: Negative.   Genitourinary: Negative.   Musculoskeletal: Positive for neck pain.  Skin: Negative.   Neurological:       Weakness in the right arm and numbness in the 3 digits on the right hand  Endo/Heme/Allergies: Negative.   Psychiatric/Behavioral: Negative.     Blood pressure 117/55, pulse 51, temperature 98.3 F (36.8 C), temperature source Oral, resp. rate 20, weight 107.502 kg (237 lb), SpO2 98 %. Physical Exam  Constitutional: He is oriented to person, place, and time. He appears well-developed and well-nourished.  HENT:  Head: Normocephalic and atraumatic.  Eyes: Conjunctivae and EOM are normal. Pupils are equal, round, and reactive to light.  Neck:  Positive Spurling sign when turning head to the right side.  Cardiovascular: Normal rate and regular rhythm.   Respiratory:  Effort normal and breath sounds normal.  GI: Soft. Bowel sounds are normal.  Musculoskeletal:  Right biceps wrist extensors and grip all week to 4 out of 5 compared to the left side. Absent biceps reflex absent brachioradialis reflex  1+ triceps reflex on the right side on the left side reflexes are 2+ lower extremity reflexes are 2+.  Neurological: He is alert and oriented to person, place, and time.  Cranial nerve examination is normal. Station and gait is normal.     Assessment/Plan Herniated nucleus pulposus C5-C6 with right cervical radiculopathy.  Procedure anterior cervical decompression arthrodesis C5-C6.  [name], MD 04/26/2015, 1:11 PM

## 2015-04-26 NOTE — Progress Notes (Signed)
Patient ID: Arthur Mata, male   DOB: 1955/08/31, 60 y.o.   MRN: [id number] [place] awake Motor function appears good in upper extremities though right grip still is weak at 4+ out of 5 Incision is clean and dry Still has numbness in her fourth and fifth digits He also feels woozy and has not ambulated nor has he voided. I've advised patient to stay overnight He'll be discharged in the morning.

## 2015-04-26 NOTE — Op Note (Signed)
Date of surgery: 04/26/2015 Preoperative diagnosis: Cervical radiculopathy secondary to spondylosis C5-6, with right cervical radiculopathy with herniated nucleus pulposus Postoperative diagnosis: Cervical radiculopathy C5-6 right secondary to spondylosis and herniated nucleus pulposus Procedure: Anterior cervical decompression C5-C6 arthrodesis with peek structural spacer, and alt.anterior plate. Surgeon: Arthur Mata First assistant: Arthur Mata M.D. Anesthesia: Gen. endotracheal Indications Arthur Mata Arthur Mata is a 60 year old individual is had a right cervical radiculopathy seemingly in a C6 and the C7 distribution area he's had a previous anterior decompression at the C6-C7 level. He has evidence of a herniated nucleus pulposus in addition to severe spondylosis at C5-C6 Arthur Mata is some spondylitic disease at C7-T1 is advised that he may ultimately need to surgeries one in the front at the C5-6 level one posteriorly at C6 C7 T1. He is taken to the operating room now for the anterior decompression at C5-C6.   Procedure patient was brought to the operating room and placed on the table supine position. After the smooth induction of general endotracheal anesthesia he was placed in 5 pounds of halter traction and the neck was prepped with alcohol DuraPrep. Transverse incision was created and left-sided neck and this was carried down through the platysma. The plane between the sternocleidomastoid and strap muscles dissected bluntly until the prevertebral space was reached. First identifiable disc space was noted to be that of C5-C6. This was identified positively by the plate near proximity on C6. Then the disc space was opened and a self-retaining Caspar retractor was placed into the wound. A significant quantity of severely degenerated and desiccated disc material was removed from within the disc space. Large ventral osteophytes from the inferior marginal bodies C5 was taken down with a high-speed drill and  Leksell rongeur. Is disc space was evacuated the region of the posterior longitudinal ligament was evaluated and here there was noted to be a significant osteophyte from the inferior marginal bodies C5. This was drilled down into the uncinate process spurs on either side. Then the ligament was taken up with a 1 and 2 mm Kerrison punch. Dissection was carried out to the right side decompressed right-sided C6 nerve root. The left side is decompressed in a similar fashion. Once the area was cleared completely the interspace was sized and is felt that a 7 mm peek spacer with 5 of lordosis measuring 17 mm with by 14 mm depth with fit best this was attached to an alternative 7 mm cranial half plate and was fastened to the vertebrae of C5 with 216 mm screws after for after first tapping the screw holes with all. A 14 mm screw was placed inferiorly to secured to the C6 vertebrae. A final radiograph was obtained identifying good position of the plate the screws and the peek interbody spacer. With this the wound was checked for hemostasis. Verified the platysma was closed with 3-0 Vicryl interrupted fashion, 3-0 Vicryl was used in septic or tissues. Dermabond was placed on the skin.

## 2015-04-26 NOTE — Discharge Summary (Signed)
Physician Discharge Summary  Patient ID: Arthur Mata MRN: [id number] DOB/AGE: 09/16/1955 60 y.o.  Admit date: 04/26/2015 Discharge date: 04/26/2015  Admission Diagnoses: Herniated nucleus pulposus C5-6 and C7-T1 with right cervical radiculopathy  Discharge Diagnoses: Herniated nucleus pulposus C5-6 and C7-T1 with right cervical radiculopathy. Diabetes mellitus. Active Problems:   Cervical radiculopathy   Discharged Condition: good  Hospital Course: Patient was admitted to undergo anterior cervical decompression arthrodesis at C5-6 adjacent to a previousC 6 C7 fusion. He tolerated surgery well  Consults: None  Significant Diagnostic Studies: None  Treatments: surgery: Anterior cervical decompression arthrodesis C5-C6  Discharge Exam: Blood pressure 116/52, pulse 55, temperature 97.7 F (36.5 C), temperature source Oral, resp. rate 16, weight 107.502 kg (237 lb), SpO2 95 %. Incision is clean and dry. Grip strength on the right side is decreased to 4-5 intrinsic strength is decreased to 4-5 there is some paresthesias that exist in the third fourth and fifth digits on the right hand.  Disposition: 01-Home or Self Care  Discharge Instructions    Call MD for:  redness, tenderness, or signs of infection (pain, swelling, redness, odor or green/yellow discharge around incision site)    Complete by:  As directed      Call MD for:  severe uncontrolled pain    Complete by:  As directed      Call MD for:  temperature >100.4    Complete by:  As directed      Diet - low sodium heart healthy    Complete by:  As directed      Discharge instructions    Complete by:  As directed   Okay to shower. Do not apply salves or appointments to incision. No heavy lifting with the upper extremities greater than 15 pounds. May resume driving when not requiring pain medication and patient feels comfortable with doing so.     Increase activity slowly    Complete by:  As directed             Medication  List    TAKE these medications        alprazolam 2 MG tablet  Commonly known as:  XANAX  Take 2 mg by mouth 3 (three) times daily as needed.     aspirin 81 MG tablet  Take 81 mg by mouth every evening.     atorvastatin 40 MG tablet  Commonly known as:  LIPITOR  Take 40 mg by mouth every evening.     BAYER CONTOUR NEXT TEST test strip  Generic drug:  glucose blood  USE TO CHECK BLOOD SUGAR 5 TIMES PER DAY     buPROPion 150 MG 12 hr tablet  Commonly known as:  WELLBUTRIN SR  Take 150 mg by mouth 2 (two) times daily.     diazepam 5 MG tablet  Commonly known as:  VALIUM  Take 1 tablet (5 mg total) by mouth every 6 (six) hours as needed for muscle spasms.     digoxin 0.25 MG tablet  Commonly known as:  LANOXIN  Take 1 tablet (0.25 mg total) by mouth daily.     gabapentin 600 MG tablet  Commonly known as:  NEURONTIN  Take 600 mg by mouth at bedtime.     Insulin Lispro 200 UNIT/ML Sopn  Commonly known as:  HUMALOG KWIKPEN  Inject 130 Units into the skin 3 (three) times daily. Inject 50 units at breakfast, 30 units at lunch and 50 units at dinner     Insulin Pen  Needle 32G X 4 MM Misc  Commonly known as:  BD PEN NEEDLE NANO U/F  Use to inject insulin 4 times daily as instructed.     lisinopril 5 MG tablet  Commonly known as:  PRINIVIL,ZESTRIL  Take 5 mg by mouth every evening.     metoprolol succinate 25 MG 24 hr tablet  Commonly known as:  TOPROL-XL  Take 25 mg by mouth every evening.     nitroGLYCERIN 0.4 MG SL tablet  Commonly known as:  NITROSTAT  Place 1 tablet (0.4 mg total) under the tongue every 5 (five) minutes as needed for chest pain.     oxyCODONE-acetaminophen 5-325 MG tablet  Commonly known as:  PERCOCET/ROXICET  Take 1-2 tablets by mouth every 4 (four) hours as needed for moderate pain.     pioglitazone 30 MG tablet  Commonly known as:  ACTOS  Take 1 tablet (30 mg total) by mouth daily.     TOUJEO SOLOSTAR 300 UNIT/ML Sopn  Generic drug:  Insulin  Glargine  INJECT 120 UNITS INTO THE SKIN DAILY BEFORE BREAKFAST.         SignedEarleen Newport 04/26/2015, 7:27 PM

## 2015-04-26 NOTE — Transfer of Care (Signed)
Immediate Anesthesia Transfer of Care Note  Patient: Arthur Mata  Procedure(s) Performed: Procedure(s): Anterior Cervical Discectomy and Fusion - Cervical five-Cervical six with alta interbody (N/A)  Patient Location: PACU  Anesthesia Type:General  Level of Consciousness: awake, alert  and oriented  Airway & Oxygen Therapy: Patient Spontanous Breathing and Patient connected to nasal cannula oxygen  Post-op Assessment: Report given to RN, Post -op Vital signs reviewed and stable and Patient moving all extremities  Post vital signs: Reviewed and stable  Last Vitals:  Filed Vitals:   04/26/15 0925  BP: 117/55  Pulse: 51  Temp: 36.8 C  Resp: 20    Complications: No apparent anesthesia complications

## 2015-04-26 NOTE — Anesthesia Postprocedure Evaluation (Signed)
Anesthesia Post Note  Patient: Arthur Mata  Procedure(s) Performed: Procedure(s) (LRB): Anterior Cervical Discectomy and Fusion - Cervical five-Cervical six with alta interbody (N/A)  Patient location during evaluation: PACU Anesthesia Type: General Level of consciousness: awake and alert Pain management: pain level controlled Vital Signs Assessment: post-procedure vital signs reviewed and stable Respiratory status: spontaneous breathing, nonlabored ventilation, respiratory function stable and patient connected to nasal cannula oxygen Cardiovascular status: blood pressure returned to baseline and stable Postop Assessment: no signs of nausea or vomiting Anesthetic complications: no    Last Vitals:  Filed Vitals:   04/26/15 1545 04/26/15 1615  BP: 132/66 120/58  Pulse: 61 52  Temp:    Resp: 8 10    Last Pain:  Filed Vitals:   04/26/15 1621  PainSc: 7                  Effie Berkshire

## 2015-04-26 NOTE — OR Nursing (Signed)
Dr Ellene Route at bedside.  Pt c/o pain 20/10.  Orders received for toradol and IV valium.  Discussed Valium with Dr. Smith Robert MDA.  Will be conservative with valium/dilaudid due to somulence.

## 2015-04-26 NOTE — Anesthesia Preprocedure Evaluation (Addendum)
Anesthesia Evaluation  Patient identified by MRN, date of birth, ID band Patient awake    Reviewed: Allergy & Precautions, NPO status , Patient's Chart, lab work & pertinent test results, reviewed documented beta blocker date and time   Airway Mallampati: III  TM Distance: >3 FB Neck ROM: Full    Dental  (+) Partial Upper, Dental Advisory Given   Pulmonary COPD, Current Smoker,    breath sounds clear to auscultation       Cardiovascular hypertension, Pt. on medications and Pt. on home beta blockers + angina with exertion + CAD   Rhythm:Regular Rate:Normal     Neuro/Psych  Headaches, negative psych ROS   GI/Hepatic negative GI ROS, Neg liver ROS,   Endo/Other  diabetes, Insulin Dependent  Renal/GU negative Renal ROS  negative genitourinary   Musculoskeletal negative musculoskeletal ROS (+)   Abdominal   Peds negative pediatric ROS (+)  Hematology negative hematology ROS (+)   Anesthesia Other Findings - HLD   Reproductive/Obstetrics negative OB ROS                            Lab Results  Component Value Date   WBC 9.5 04/11/2015   HGB 15.8 04/11/2015   HCT 46.5 04/11/2015   MCV 90.6 04/11/2015   PLT 189 04/11/2015   Lab Results  Component Value Date   CREATININE 1.03 04/11/2015   BUN 10 04/11/2015   NA 138 04/11/2015   K 4.5 04/11/2015   CL 104 04/11/2015   CO2 25 04/11/2015   No results found for: INR, PROTIME  03/2015 EKG: sinus bradycardia.   Anesthesia Physical Anesthesia Plan  ASA: III  Anesthesia Plan: General   Post-op Pain Management:    Induction: Intravenous  Airway Management Planned: Oral ETT and Video Laryngoscope Planned  Additional Equipment:   Intra-op Plan:   Post-operative Plan: Extubation in OR  Informed Consent: I have reviewed the patients History and Physical, chart, labs and discussed the procedure including the risks, benefits and  alternatives for the proposed anesthesia with the patient or authorized representative who has indicated his/her understanding and acceptance.   Dental advisory given  Plan Discussed with: CRNA  Anesthesia Plan Comments:        Anesthesia Quick Evaluation

## 2015-04-27 DIAGNOSIS — I2510 Atherosclerotic heart disease of native coronary artery without angina pectoris: Secondary | ICD-10-CM | POA: Diagnosis not present

## 2015-04-27 DIAGNOSIS — M50122 Cervical disc disorder at C5-C6 level with radiculopathy: Secondary | ICD-10-CM | POA: Diagnosis not present

## 2015-04-27 DIAGNOSIS — F17210 Nicotine dependence, cigarettes, uncomplicated: Secondary | ICD-10-CM | POA: Diagnosis not present

## 2015-04-27 DIAGNOSIS — Z794 Long term (current) use of insulin: Secondary | ICD-10-CM | POA: Diagnosis not present

## 2015-04-27 DIAGNOSIS — E119 Type 2 diabetes mellitus without complications: Secondary | ICD-10-CM | POA: Diagnosis not present

## 2015-04-27 DIAGNOSIS — E785 Hyperlipidemia, unspecified: Secondary | ICD-10-CM | POA: Diagnosis not present

## 2015-04-27 DIAGNOSIS — I10 Essential (primary) hypertension: Secondary | ICD-10-CM | POA: Diagnosis not present

## 2015-04-27 DIAGNOSIS — J449 Chronic obstructive pulmonary disease, unspecified: Secondary | ICD-10-CM | POA: Diagnosis not present

## 2015-04-27 DIAGNOSIS — Z7982 Long term (current) use of aspirin: Secondary | ICD-10-CM | POA: Diagnosis not present

## 2015-04-27 LAB — GLUCOSE, CAPILLARY: GLUCOSE-CAPILLARY: 126 mg/dL — AB (ref 65–99)

## 2015-04-27 NOTE — Progress Notes (Signed)
Orthopedic Tech Progress Note Patient Details:  Arthur Mata 06-23-1955 [id number]  Ortho Devices Type of Ortho Device: Soft collar Ortho Device/Splint Interventions: Ordered, Application   Karolee Stamps 04/27/2015, 7:08 AM

## 2015-04-27 NOTE — Progress Notes (Signed)
Patient alert and oriented, mae's well, voiding adequate amount of urine, swallowing without difficulty, no c/o pain. Patient discharged home with family. Script and discharged instructions given to patient. Patient and family stated understanding of instructions given. Pt has an appointment with physician in 2 weeks.

## 2015-04-29 ENCOUNTER — Encounter (HOSPITAL_COMMUNITY): Payer: Self-pay | Admitting: Neurological Surgery

## 2015-04-30 MED FILL — DIGOXIN 250 MCG TABLET: 250 | 90 days supply | Qty: 90 | Fill #0

## 2015-05-01 ENCOUNTER — Encounter (HOSPITAL_COMMUNITY): Payer: Self-pay | Admitting: Neurological Surgery

## 2015-05-09 DIAGNOSIS — M5412 Radiculopathy, cervical region: Secondary | ICD-10-CM | POA: Diagnosis not present

## 2015-05-27 MED FILL — ALPRAZolam 2 MG TABS: 2 | 30 days supply | Qty: 90 | Fill #0

## 2015-05-30 MED FILL — CONTOUR NEXT STRIPS: 30 days supply | Qty: 150 | Fill #2

## 2015-05-30 MED FILL — TOUJEO SOLOSTAR 300 UNITS/M: 300 | 25 days supply | Qty: 9 | Fill #1

## 2015-06-13 MED FILL — BUPROPION HCL SR 150 MG TAB: 150 | 30 days supply | Qty: 60 | Fill #0

## 2015-06-13 MED FILL — ATORVASTATIN 40 MG TABLET: 40 | 90 days supply | Qty: 90 | Fill #1

## 2015-06-13 MED FILL — LISINOPRIL 5 MG TABLET: 5 | 90 days supply | Qty: 90 | Fill #1

## 2015-06-19 ENCOUNTER — Other Ambulatory Visit: Payer: Self-pay | Admitting: Pharmacist

## 2015-06-19 ENCOUNTER — Encounter: Payer: Self-pay | Admitting: Pharmacist

## 2015-06-19 VITALS — BP 154/65 | HR 62 | Wt 234.6 lb

## 2015-06-19 DIAGNOSIS — Z794 Long term (current) use of insulin: Principal | ICD-10-CM

## 2015-06-19 DIAGNOSIS — E119 Type 2 diabetes mellitus without complications: Secondary | ICD-10-CM

## 2015-06-19 NOTE — Progress Notes (Signed)
Subjective:  Patient presents today for 3 month diabetes follow-up as part of the employer-sponsored [employer] to [employer] program.  Current diabetes regimen includes Humalog, Toujeo and pioglitazone. Patient also continues on daily ASA, ACE Inhibitor and statin.  Most recent MD follow-up was 04/11/15.    Assessment:  Diabetes: Most recent A1C was 8.7% which is exceeding goal of less than 7%. Weight is increased from last visit with me.   Lifestyle improvements:  Physical Activity- Physical activity has been limited by neck pain. Pending neurological exams and likely surgery.  Nutrition- Diet has been less controlled in recent months.  Follow up with me in 3 months.    Plan/Goals for Next Visit:  1. Need eye exam! 2. Talk with Dr. Dwyane Dee about a potential weight loss medication and diabetes medication (2 in 1) 3. Talk with Dr. Dwyane Dee about adjusting your Humalog dose depending on your activity level.  4. If you're active, check your blood glucose more often.    Next appointment to see me is: 09/10/15

## 2015-06-20 DIAGNOSIS — M5412 Radiculopathy, cervical region: Secondary | ICD-10-CM | POA: Diagnosis not present

## 2015-06-20 DIAGNOSIS — G5601 Carpal tunnel syndrome, right upper limb: Secondary | ICD-10-CM | POA: Diagnosis not present

## 2015-06-20 DIAGNOSIS — G5621 Lesion of ulnar nerve, right upper limb: Secondary | ICD-10-CM | POA: Diagnosis not present

## 2015-06-26 MED FILL — TOUJEO SOLOSTAR 300 UNITS/M: 300 | 25 days supply | Qty: 9 | Fill #2

## 2015-06-27 ENCOUNTER — Other Ambulatory Visit: Payer: Self-pay | Admitting: Endocrinology

## 2015-06-27 DIAGNOSIS — Z6834 Body mass index (BMI) 34.0-34.9, adult: Secondary | ICD-10-CM | POA: Diagnosis not present

## 2015-06-27 DIAGNOSIS — M5412 Radiculopathy, cervical region: Secondary | ICD-10-CM | POA: Diagnosis not present

## 2015-06-28 MED FILL — PIOGLITAZONE HCL 30 MG TAB: 30 | 30 days supply | Qty: 30 | Fill #0

## 2015-07-01 ENCOUNTER — Other Ambulatory Visit: Payer: Self-pay | Admitting: Neurological Surgery

## 2015-07-04 ENCOUNTER — Encounter: Payer: Self-pay | Admitting: Endocrinology

## 2015-07-04 ENCOUNTER — Other Ambulatory Visit: Payer: Self-pay | Admitting: *Deleted

## 2015-07-04 ENCOUNTER — Ambulatory Visit (INDEPENDENT_AMBULATORY_CARE_PROVIDER_SITE_OTHER): Payer: 59 | Admitting: Endocrinology

## 2015-07-04 VITALS — BP 122/64 | HR 56 | Temp 98.2°F | Resp 16 | Ht 72.0 in | Wt 235.6 lb

## 2015-07-04 DIAGNOSIS — E1140 Type 2 diabetes mellitus with diabetic neuropathy, unspecified: Secondary | ICD-10-CM

## 2015-07-04 DIAGNOSIS — Z794 Long term (current) use of insulin: Secondary | ICD-10-CM | POA: Diagnosis not present

## 2015-07-04 DIAGNOSIS — E1165 Type 2 diabetes mellitus with hyperglycemia: Secondary | ICD-10-CM

## 2015-07-04 LAB — POCT GLYCOSYLATED HEMOGLOBIN (HGB A1C): Hemoglobin A1C: 9.3

## 2015-07-04 MED ORDER — LIRAGLUTIDE 18 MG/3ML ~~LOC~~ SOPN: PEN_INJECTOR | SUBCUTANEOUS | Status: DC

## 2015-07-04 MED FILL — VICTOZA 18 MG/3 ML INJECT P: 18 | 30 days supply | Qty: 9 | Fill #0

## 2015-07-04 NOTE — Patient Instructions (Signed)
Reduce Humalog to 40 at Bfst; add a protein in am  60 avg dose Humalog at supper  Toujeo 125 units at nite, keep am sugar <130  Check blood sugars on waking up  4 times a week Also check blood sugars about 2 hours after a meal and do this after different meals by rotation  Recommended blood sugar levels on waking up is 90-130 and about 2 hours after meal is 130-160  Please bring your blood sugar monitor to each visit, thank you  Start VICTOZA injection as shown once daily at the same time of the day.  Dial the dose to 0.6 mg on the pen for the first week.   You may inject in the stomach, thigh or arm. You may experience nausea in the first few days which usually goes away.  You will feel fullness of the stomach with starting the medication and should try to keep the portions at meals small.  After 5 daysincrease the dose to 1.2mg  daily if no nausea present then 1.8.    If any questions or concerns are present call the office or the [facility] helpline at [phone]. Visit [email or web address].info/ for more useful information
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
## 2015-07-09 ENCOUNTER — Encounter (HOSPITAL_COMMUNITY)
Admission: RE | Admit: 2015-07-09 | Discharge: 2015-07-09 | Disposition: A | Discharge status: Home / Self Care | Payer: 59 | Source: Ambulatory Visit | Attending: Neurological Surgery | Admitting: Neurological Surgery

## 2015-07-09 ENCOUNTER — Encounter (HOSPITAL_COMMUNITY): Payer: Self-pay

## 2015-07-09 DIAGNOSIS — M960 Pseudarthrosis after fusion or arthrodesis: Secondary | ICD-10-CM | POA: Diagnosis not present

## 2015-07-09 DIAGNOSIS — M4723 Other spondylosis with radiculopathy, cervicothoracic region: Secondary | ICD-10-CM | POA: Diagnosis not present

## 2015-07-09 DIAGNOSIS — Z6833 Body mass index (BMI) 33.0-33.9, adult: Secondary | ICD-10-CM | POA: Diagnosis not present

## 2015-07-09 DIAGNOSIS — I10 Essential (primary) hypertension: Secondary | ICD-10-CM | POA: Diagnosis not present

## 2015-07-09 DIAGNOSIS — E785 Hyperlipidemia, unspecified: Secondary | ICD-10-CM | POA: Diagnosis not present

## 2015-07-09 DIAGNOSIS — E669 Obesity, unspecified: Secondary | ICD-10-CM | POA: Diagnosis not present

## 2015-07-09 DIAGNOSIS — J449 Chronic obstructive pulmonary disease, unspecified: Secondary | ICD-10-CM | POA: Diagnosis not present

## 2015-07-09 DIAGNOSIS — E119 Type 2 diabetes mellitus without complications: Secondary | ICD-10-CM | POA: Diagnosis not present

## 2015-07-09 DIAGNOSIS — I2510 Atherosclerotic heart disease of native coronary artery without angina pectoris: Secondary | ICD-10-CM | POA: Diagnosis not present

## 2015-07-09 HISTORY — DX: Unspecified osteoarthritis, unspecified site: M19.90

## 2015-07-09 HISTORY — DX: Personal history of other diseases of the respiratory system: Z87.09

## 2015-07-09 HISTORY — DX: Personal history of pneumonia (recurrent): Z87.01

## 2015-07-09 LAB — SURGICAL PCR SCREEN
MRSA, PCR: NEGATIVE
STAPHYLOCOCCUS AUREUS: NEGATIVE

## 2015-07-09 LAB — CBC
HEMATOCRIT: 44.9 % (ref 39.0–52.0)
HEMOGLOBIN: 15.3 g/dL (ref 13.0–17.0)
MCH: 30.6 pg (ref 26.0–34.0)
MCHC: 34.1 g/dL (ref 30.0–36.0)
MCV: 89.8 fL (ref 78.0–100.0)
Platelets: 198 10*3/uL (ref 150–400)
RBC: 5 MIL/uL (ref 4.22–5.81)
RDW: 12.4 % (ref 11.5–15.5)
WBC: 9.2 10*3/uL (ref 4.0–10.5)

## 2015-07-09 LAB — BASIC METABOLIC PANEL
Anion gap: 8 (ref 5–15)
BUN: 12 mg/dL (ref 6–20)
CHLORIDE: 101 mmol/L (ref 101–111)
CO2: 25 mmol/L (ref 22–32)
Calcium: 9.5 mg/dL (ref 8.9–10.3)
Creatinine, Ser: 1.05 mg/dL (ref 0.61–1.24)
GFR calc non Af Amer: >60 mL/min (ref >60)
Glucose, Bld: 237 mg/dL — ABNORMAL HIGH (ref 65–99)
POTASSIUM: 4.5 mmol/L (ref 3.5–5.1)
SODIUM: 134 mmol/L — AB (ref 135–145)

## 2015-07-09 LAB — TYPE AND SCREEN
ABO/RH(D): A NEG
ANTIBODY SCREEN: NEGATIVE

## 2015-07-09 LAB — ABO/RH: ABO/RH(D): A NEG

## 2015-07-09 LAB — GLUCOSE, CAPILLARY: Glucose-Capillary: 310 mg/dL — ABNORMAL HIGH (ref 65–99)

## 2015-07-09 NOTE — Progress Notes (Signed)
PCP - Dr. Hali Marry Cardiologist - Dr. Mare Ferrari Endocrinologist - Dr. Dwyane Dee  EKG - 04/11/15 CXR - denies  Echo - denies Stress test - 02/2014 Cardiac Cath - 10/2012  Patient denies chest pain and shortness of breath at PAT appointment.    Patient had his A1C last checked on 07/04/15 - 9.3.  Per Dr. Ronnie Derby note, patient is no longer taking Actos, but started on Victoza.  Patient informed nurse that he checks his blood sugar 4-5 times a day and that his fasting is 90-130.    Patient's blood sugar 310 when he arrived to PAT appointment but states that he did not take any medication this morning and he ate a candy bar on the way to the appointment.

## 2015-07-09 NOTE — Pre-Procedure Instructions (Signed)
Arthur Mata  07/09/2015      [facility] [place] [address] [place] [address] Phone: [phone] Fax: [phone]    Your procedure is scheduled on Friday, May 19th, 2017.  Report to [facility] Admitting at 10:00 A.M.   Call this number if you have problems the morning of surgery:  [phone]   Remember:  Do not eat food or drink liquids after midnight.   Take these medicines the morning of surgery with A SIP OF WATER: Oxycodone-acetaminophen (Percocet) if needed.  WHAT DO I DO ABOUT MY DIABETES MEDICATION?   Marland Kitchen Do not take oral diabetes medicines (pills) the morning of surgery.  . THE NIGHT BEFORE SURGERY, take 60  units of Toujeo insulin.     . THE MORNING OF SURGERY,  Do not take any diabetes medication unless your blood sugar is greater than 220, then take 1/2 of your dose of Humalog.    . The day of surgery, do not take other diabetes injectables, including Byetta (exenatide), Bydureon (exenatide ER), Victoza (liraglutide), or Trulicity (dulaglutide).  . If your CBG is greater than 220 mg/dL, you may take  of your sliding scale (correction) dose of insulin.  On day of surgery, if blood sugar is greater than 220, you may take 1/2 of your Humalog insulin (25 units).   .   Stop taking: Aspirin, NSAIDS, Bayer, Ibuprofen, Advil, Motrin, Naproxen, Aleve, BC's, Goody's, Fish oil, all herbal medications, and all vitamins.    Do not wear jewelry.  Do not wear lotions, powders, or colognes.  You may NOT wear deodorant.  Men may shave face and neck.  Do not bring valuables to the hospital.   [facility] is not responsible for any belongings or valuables.  Contacts, dentures or bridgework may not be worn into surgery.  Leave your suitcase in the car.  After surgery it may be brought to your room.  For patients admitted to the hospital, discharge time will be determined by  your treatment team.  Patients discharged the day of surgery will not be allowed to drive home.   Special instructions:  See attached.   Please read over the following fact sheets that you were given. Pain Booklet, Coughing and Deep Breathing, Blood Transfusion Information, MRSA Information and Surgical Site Infection Prevention      How to Manage Your Diabetes Before and After Surgery  Why is it important to control my blood sugar before and after surgery? . Improving blood sugar levels before and after surgery helps healing and can limit problems. . A way of improving blood sugar control is eating a healthy diet by: o  Eating less sugar and carbohydrates o  Increasing activity/exercise o  Talking with your doctor about reaching your blood sugar goals . High blood sugars (greater than 180 mg/dL) can raise your risk of infections and slow your recovery, so you will need to focus on controlling your diabetes during the weeks before surgery. . Make sure that the doctor who takes care of your diabetes knows about your planned surgery including the date and location.  How do I manage my blood sugar before surgery? . Check your blood sugar at least 4 times a day, starting 2 days before surgery, to make sure that the level is not too high or low. o Check your blood sugar the morning of your surgery when you wake  up and every 2 hours until you get to the Short Stay unit. . If your blood sugar is less than 70 mg/dL, you will need to treat for low blood sugar: o Do not take insulin. o Treat a low blood sugar (less than 70 mg/dL) with  cup of clear juice (cranberry or apple), 4 glucose tablets, OR glucose gel. o Recheck blood sugar in 15 minutes after treatment (to make sure it is greater than 70 mg/dL). If your blood sugar is not greater than 70 mg/dL on recheck, call [phone] for further instructions. . Report your blood sugar to the short stay nurse when you get to Short Stay.  . If  you are admitted to the hospital after surgery: o Your blood sugar will be checked by the staff and you will probably be given insulin after surgery (instead of oral diabetes medicines) to make sure you have good blood sugar levels. o The goal for blood sugar control after surgery is 80-180 mg/dL.

## 2015-07-10 NOTE — Progress Notes (Signed)
Anesthesia Chart Review:  Pt is a 60 year old male scheduled for C5-6, C7-T1 posterior cervical fusion with lateral mass fixation on 07/12/2015 with Dr. Ellene Route.   Cardiologist is Dr. Darlin Coco, last office visit 04/13/14.   PMH includes: CAD, HTN, angina, DM, palpitations (sinus tachycardia vs SVT), hyperlipidemia, COPD. Former smoker (quit 03/27/15). BMI 33. S/p ACDF 04/26/15.   Medications include: ASA, lipitor, digoxin,humalog, victoza, lisinopril, metoprolol, pioglitazone, toujeo.   Preoperative labs reviewed. Glucose 237. HgbA1c was 9.3 on 07/04/15.   EKG 04/11/15: sinus bradycardia (57 bpm).  Nuclear stress test 03/13/14: Normal stress nuclear study. LV Ejection Fraction: 63%. LV Wall Motion: NL LV Function; NL Wall Motion  Cardiac cath 11/17/12 ([place], [place]): - LM patent - mid LAD 30% lesion - LCx prox 40% lesion - RCA: mid and distal 20-30% lesion - EF 65%  If no changes, I anticipate pt can proceed with surgery as scheduled.   Willeen Cass, FNP-BC [facility]/Anesthesiology Phone: [phone] 07/10/2015 12:49 PM

## 2015-07-10 NOTE — Progress Notes (Signed)
error 

## 2015-07-11 MED ORDER — CEFAZOLIN SODIUM-DEXTROSE 2-4 GM/100ML-% IV SOLN
2.0000 g | INTRAVENOUS | Status: AC
  Administered 2015-07-12: 2 g via INTRAVENOUS
  Filled 2015-07-11: qty 100

## 2015-07-12 ENCOUNTER — Encounter (HOSPITAL_COMMUNITY)
Admission: RE | Disposition: A | Discharge status: Home / Self Care | Payer: Self-pay | Source: Ambulatory Visit | Attending: Neurological Surgery

## 2015-07-12 ENCOUNTER — Inpatient Hospital Stay (HOSPITAL_COMMUNITY)
Admission: RE | Admit: 2015-07-12 | Discharge: 2015-07-13 | DRG: 473 | Disposition: A | Discharge status: Home / Self Care | Payer: 59 | Source: Ambulatory Visit | Attending: Neurosurgery | Admitting: Neurosurgery

## 2015-07-12 ENCOUNTER — Inpatient Hospital Stay (HOSPITAL_COMMUNITY): Payer: 59 | Admitting: Vascular Surgery

## 2015-07-12 ENCOUNTER — Encounter (HOSPITAL_COMMUNITY): Payer: Self-pay | Admitting: Certified Registered Nurse Anesthetist

## 2015-07-12 ENCOUNTER — Inpatient Hospital Stay (HOSPITAL_COMMUNITY): Payer: 59 | Admitting: Anesthesiology

## 2015-07-12 ENCOUNTER — Inpatient Hospital Stay (HOSPITAL_COMMUNITY): Payer: 59

## 2015-07-12 DIAGNOSIS — I2510 Atherosclerotic heart disease of native coronary artery without angina pectoris: Secondary | ICD-10-CM | POA: Diagnosis present

## 2015-07-12 DIAGNOSIS — J449 Chronic obstructive pulmonary disease, unspecified: Secondary | ICD-10-CM | POA: Diagnosis not present

## 2015-07-12 DIAGNOSIS — Z87828 Personal history of other (healed) physical injury and trauma: Secondary | ICD-10-CM

## 2015-07-12 DIAGNOSIS — M1990 Unspecified osteoarthritis, unspecified site: Secondary | ICD-10-CM | POA: Diagnosis not present

## 2015-07-12 DIAGNOSIS — E785 Hyperlipidemia, unspecified: Secondary | ICD-10-CM | POA: Diagnosis present

## 2015-07-12 DIAGNOSIS — M4723 Other spondylosis with radiculopathy, cervicothoracic region: Secondary | ICD-10-CM | POA: Diagnosis not present

## 2015-07-12 DIAGNOSIS — E119 Type 2 diabetes mellitus without complications: Secondary | ICD-10-CM | POA: Diagnosis present

## 2015-07-12 DIAGNOSIS — Z87891 Personal history of nicotine dependence: Secondary | ICD-10-CM | POA: Diagnosis not present

## 2015-07-12 DIAGNOSIS — M960 Pseudarthrosis after fusion or arthrodesis: Principal | ICD-10-CM | POA: Diagnosis present

## 2015-07-12 DIAGNOSIS — E669 Obesity, unspecified: Secondary | ICD-10-CM | POA: Diagnosis present

## 2015-07-12 DIAGNOSIS — M5412 Radiculopathy, cervical region: Secondary | ICD-10-CM | POA: Diagnosis present

## 2015-07-12 DIAGNOSIS — I10 Essential (primary) hypertension: Secondary | ICD-10-CM | POA: Diagnosis present

## 2015-07-12 DIAGNOSIS — Z794 Long term (current) use of insulin: Secondary | ICD-10-CM

## 2015-07-12 DIAGNOSIS — Z7982 Long term (current) use of aspirin: Secondary | ICD-10-CM

## 2015-07-12 DIAGNOSIS — Z6833 Body mass index (BMI) 33.0-33.9, adult: Secondary | ICD-10-CM | POA: Diagnosis not present

## 2015-07-12 DIAGNOSIS — Z981 Arthrodesis status: Secondary | ICD-10-CM | POA: Diagnosis not present

## 2015-07-12 DIAGNOSIS — Z419 Encounter for procedure for purposes other than remedying health state, unspecified: Secondary | ICD-10-CM

## 2015-07-12 DIAGNOSIS — Y838 Other surgical procedures as the cause of abnormal reaction of the patient, or of later complication, without mention of misadventure at the time of the procedure: Secondary | ICD-10-CM | POA: Diagnosis present

## 2015-07-12 HISTORY — PX: POSTERIOR CERVICAL FUSION/FORAMINOTOMY: SHX5038

## 2015-07-12 LAB — GLUCOSE, CAPILLARY
GLUCOSE-CAPILLARY: 134 mg/dL — AB (ref 65–99)
GLUCOSE-CAPILLARY: 145 mg/dL — AB (ref 65–99)
Glucose-Capillary: 191 mg/dL — ABNORMAL HIGH (ref 65–99)
Glucose-Capillary: 119 mg/dL — ABNORMAL HIGH (ref 65–99)

## 2015-07-12 SURGERY — POSTERIOR CERVICAL FUSION/FORAMINOTOMY LEVEL 2
Anesthesia: General

## 2015-07-12 MED ORDER — INSULIN GLARGINE 100 UNIT/ML ~~LOC~~ SOLN
60.0000 [IU] | Freq: Every day | SUBCUTANEOUS | Status: DC
  Filled 2015-07-12: qty 0.6

## 2015-07-12 MED ORDER — HYDROMORPHONE HCL 1 MG/ML IJ SOLN
INTRAMUSCULAR | Status: AC
  Administered 2015-07-12: 0.5 mg via INTRAVENOUS
  Filled 2015-07-12: qty 2

## 2015-07-12 MED ORDER — SUCCINYLCHOLINE CHLORIDE 20 MG/ML IJ SOLN
INTRAMUSCULAR | Status: DC | PRN
  Administered 2015-07-12: 120 mg via INTRAVENOUS

## 2015-07-12 MED ORDER — ACETAMINOPHEN 10 MG/ML IV SOLN
1000.0000 mg | Freq: Four times a day (QID) | INTRAVENOUS | Status: DC
  Administered 2015-07-12: 1000 mg via INTRAVENOUS

## 2015-07-12 MED ORDER — METHOCARBAMOL 500 MG PO TABS
500.0000 mg | ORAL_TABLET | Freq: Four times a day (QID) | ORAL | Status: DC | PRN
  Administered 2015-07-13: 500 mg via ORAL
  Filled 2015-07-12: qty 1

## 2015-07-12 MED ORDER — PHENOL 1.4 % MT LIQD: 1.0000 | OROMUCOSAL | Status: DC | PRN

## 2015-07-12 MED ORDER — SODIUM CHLORIDE 0.9% FLUSH: 3.0000 mL | INTRAVENOUS | Status: DC | PRN

## 2015-07-12 MED ORDER — SODIUM CHLORIDE 0.9 % IV SOLN: 250.0000 mL | INTRAVENOUS | Status: DC

## 2015-07-12 MED ORDER — SODIUM CHLORIDE 0.9% FLUSH
3.0000 mL | Freq: Two times a day (BID) | INTRAVENOUS | Status: DC
  Administered 2015-07-13: 3 mL via INTRAVENOUS

## 2015-07-12 MED ORDER — ROCURONIUM BROMIDE 100 MG/10ML IV SOLN
INTRAVENOUS | Status: DC | PRN
  Administered 2015-07-12: 30 mg via INTRAVENOUS
  Administered 2015-07-12: 50 mg via INTRAVENOUS
  Administered 2015-07-12: 20 mg via INTRAVENOUS

## 2015-07-12 MED ORDER — OXYCODONE-ACETAMINOPHEN 5-325 MG PO TABS: 1.0000 | ORAL_TABLET | ORAL | Status: DC | PRN

## 2015-07-12 MED ORDER — ALPRAZOLAM 0.5 MG PO TABS: 2.0000 mg | ORAL_TABLET | Freq: Three times a day (TID) | ORAL | Status: DC | PRN

## 2015-07-12 MED ORDER — ROCURONIUM BROMIDE 50 MG/5ML IV SOLN
INTRAVENOUS | Status: AC
  Filled 2015-07-12: qty 1

## 2015-07-12 MED ORDER — BUPIVACAINE HCL 0.5 % IJ SOLN
INTRAMUSCULAR | Status: DC | PRN
  Administered 2015-07-12: 2 mL
  Administered 2015-07-12 (×2): 10 mL

## 2015-07-12 MED ORDER — HYDROMORPHONE HCL 1 MG/ML IJ SOLN
0.2500 mg | INTRAMUSCULAR | Status: DC | PRN
  Administered 2015-07-12 (×4): 0.5 mg via INTRAVENOUS

## 2015-07-12 MED ORDER — BUPROPION HCL ER (SR) 150 MG PO TB12
150.0000 mg | ORAL_TABLET | Freq: Two times a day (BID) | ORAL | Status: DC
  Administered 2015-07-12: 150 mg via ORAL
  Filled 2015-07-12 (×2): qty 1

## 2015-07-12 MED ORDER — 0.9 % SODIUM CHLORIDE (POUR BTL) OPTIME
TOPICAL | Status: DC | PRN
  Administered 2015-07-12: 1000 mL

## 2015-07-12 MED ORDER — SUGAMMADEX SODIUM 200 MG/2ML IV SOLN
INTRAVENOUS | Status: DC | PRN
  Administered 2015-07-12: 400 mg via INTRAVENOUS

## 2015-07-12 MED ORDER — ACETAMINOPHEN 650 MG RE SUPP: 650.0000 mg | RECTAL | Status: DC | PRN

## 2015-07-12 MED ORDER — THROMBIN 5000 UNITS EX SOLR
CUTANEOUS | Status: DC | PRN
  Administered 2015-07-12 (×2): 5000 [IU] via TOPICAL

## 2015-07-12 MED ORDER — THROMBIN 5000 UNITS EX SOLR
OROMUCOSAL | Status: DC | PRN
  Administered 2015-07-12: 16:00:00 via TOPICAL

## 2015-07-12 MED ORDER — HYDROMORPHONE HCL 1 MG/ML IJ SOLN
0.5000 mg | INTRAMUSCULAR | Status: DC | PRN
  Administered 2015-07-12 – 2015-07-13 (×3): 1 mg via INTRAVENOUS
  Filled 2015-07-12 (×3): qty 1

## 2015-07-12 MED ORDER — LIRAGLUTIDE 18 MG/3ML ~~LOC~~ SOPN: 1.8000 mg | PEN_INJECTOR | Freq: Every day | SUBCUTANEOUS | Status: DC

## 2015-07-12 MED ORDER — MIDAZOLAM HCL 5 MG/5ML IJ SOLN
INTRAMUSCULAR | Status: DC | PRN
  Administered 2015-07-12: 2 mg via INTRAVENOUS

## 2015-07-12 MED ORDER — ONDANSETRON HCL 4 MG/2ML IJ SOLN
INTRAMUSCULAR | Status: DC | PRN
  Administered 2015-07-12: 4 mg via INTRAVENOUS

## 2015-07-12 MED ORDER — CEFAZOLIN SODIUM 1-5 GM-% IV SOLN
1.0000 g | Freq: Three times a day (TID) | INTRAVENOUS | Status: AC
  Administered 2015-07-12 – 2015-07-13 (×2): 1 g via INTRAVENOUS
  Filled 2015-07-12 (×2): qty 50

## 2015-07-12 MED ORDER — DIAZEPAM 5 MG PO TABS
5.0000 mg | ORAL_TABLET | Freq: Four times a day (QID) | ORAL | Status: DC | PRN
  Administered 2015-07-12 – 2015-07-13 (×2): 5 mg via ORAL
  Filled 2015-07-12 (×2): qty 1

## 2015-07-12 MED ORDER — INSULIN GLARGINE 100 UNIT/ML ~~LOC~~ SOLN
120.0000 [IU] | Freq: Every day | SUBCUTANEOUS | Status: DC
  Filled 2015-07-12: qty 1.2

## 2015-07-12 MED ORDER — SODIUM CHLORIDE 0.9 % IR SOLN
Status: DC | PRN
  Administered 2015-07-12: 15:00:00

## 2015-07-12 MED ORDER — LACTATED RINGERS IV SOLN
INTRAVENOUS | Status: DC
  Administered 2015-07-12 (×3): via INTRAVENOUS

## 2015-07-12 MED ORDER — ATORVASTATIN CALCIUM 40 MG PO TABS
40.0000 mg | ORAL_TABLET | Freq: Every evening | ORAL | Status: DC
  Administered 2015-07-12: 40 mg via ORAL
  Filled 2015-07-12: qty 1

## 2015-07-12 MED ORDER — INSULIN ASPART 100 UNIT/ML ~~LOC~~ SOLN: 50.0000 [IU] | Freq: Two times a day (BID) | SUBCUTANEOUS | Status: DC

## 2015-07-12 MED ORDER — DIGOXIN 125 MCG PO TABS
0.2500 mg | ORAL_TABLET | Freq: Every evening | ORAL | Status: DC
  Administered 2015-07-12: 0.25 mg via ORAL
  Filled 2015-07-12: qty 2

## 2015-07-12 MED ORDER — INSULIN GLARGINE 300 UNIT/ML ~~LOC~~ SOPN: 120.0000 [IU] | PEN_INJECTOR | Freq: Every day | SUBCUTANEOUS | Status: DC

## 2015-07-12 MED ORDER — FENTANYL CITRATE (PF) 250 MCG/5ML IJ SOLN
INTRAMUSCULAR | Status: DC | PRN
  Administered 2015-07-12 (×2): 50 ug via INTRAVENOUS
  Administered 2015-07-12: 150 ug via INTRAVENOUS

## 2015-07-12 MED ORDER — ONDANSETRON HCL 4 MG/2ML IJ SOLN
4.0000 mg | INTRAMUSCULAR | Status: DC | PRN
  Administered 2015-07-12 – 2015-07-13 (×3): 4 mg via INTRAVENOUS
  Filled 2015-07-12 (×3): qty 2

## 2015-07-12 MED ORDER — HYDROCODONE-ACETAMINOPHEN 7.5-325 MG PO TABS: 1.0000 | ORAL_TABLET | Freq: Once | ORAL | Status: DC | PRN

## 2015-07-12 MED ORDER — INSULIN LISPRO 200 UNIT/ML ~~LOC~~ SOPN: 30.0000 [IU] | PEN_INJECTOR | Freq: Three times a day (TID) | SUBCUTANEOUS | Status: DC

## 2015-07-12 MED ORDER — OXYCODONE-ACETAMINOPHEN 5-325 MG PO TABS
1.0000 | ORAL_TABLET | ORAL | Status: DC | PRN
  Administered 2015-07-12: 2 via ORAL
  Filled 2015-07-12: qty 2

## 2015-07-12 MED ORDER — ALUM & MAG HYDROXIDE-SIMETH 200-200-20 MG/5ML PO SUSP: 30.0000 mL | Freq: Four times a day (QID) | ORAL | Status: DC | PRN

## 2015-07-12 MED ORDER — INSULIN ASPART 100 UNIT/ML ~~LOC~~ SOLN: 30.0000 [IU] | Freq: Every day | SUBCUTANEOUS | Status: DC

## 2015-07-12 MED ORDER — ACETAMINOPHEN 10 MG/ML IV SOLN
INTRAVENOUS | Status: AC
Start: 2015-07-12 — End: 2015-07-13
  Filled 2015-07-12: qty 100

## 2015-07-12 MED ORDER — MIDAZOLAM HCL 2 MG/2ML IJ SOLN
INTRAMUSCULAR | Status: AC
  Filled 2015-07-12: qty 2

## 2015-07-12 MED ORDER — DEXTROSE 5 % IV SOLN
500.0000 mg | Freq: Four times a day (QID) | INTRAVENOUS | Status: DC | PRN
  Filled 2015-07-12: qty 5

## 2015-07-12 MED ORDER — EPHEDRINE SULFATE 50 MG/ML IJ SOLN
INTRAMUSCULAR | Status: DC | PRN
  Administered 2015-07-12: 5 mg via INTRAVENOUS
  Administered 2015-07-12: 10 mg via INTRAVENOUS
  Administered 2015-07-12 (×2): 5 mg via INTRAVENOUS

## 2015-07-12 MED ORDER — HEMOSTATIC AGENTS (NO CHARGE) OPTIME
TOPICAL | Status: DC | PRN
  Administered 2015-07-12: 1 via TOPICAL

## 2015-07-12 MED ORDER — ACETAMINOPHEN 325 MG PO TABS: 650.0000 mg | ORAL_TABLET | ORAL | Status: DC | PRN

## 2015-07-12 MED ORDER — PROPOFOL 10 MG/ML IV BOLUS
INTRAVENOUS | Status: DC | PRN
  Administered 2015-07-12: 150 mg via INTRAVENOUS

## 2015-07-12 MED ORDER — GABAPENTIN 600 MG PO TABS
600.0000 mg | ORAL_TABLET | Freq: Every day | ORAL | Status: DC
  Administered 2015-07-12: 600 mg via ORAL
  Filled 2015-07-12: qty 1

## 2015-07-12 MED ORDER — NITROGLYCERIN 0.4 MG SL SUBL: 0.4000 mg | SUBLINGUAL_TABLET | SUBLINGUAL | Status: DC | PRN

## 2015-07-12 MED ORDER — LISINOPRIL 5 MG PO TABS
5.0000 mg | ORAL_TABLET | Freq: Every evening | ORAL | Status: DC
  Administered 2015-07-12: 5 mg via ORAL
  Filled 2015-07-12: qty 1

## 2015-07-12 MED ORDER — MENTHOL 3 MG MT LOZG: 1.0000 | LOZENGE | OROMUCOSAL | Status: DC | PRN

## 2015-07-12 MED ORDER — METOPROLOL SUCCINATE ER 25 MG PO TB24
25.0000 mg | ORAL_TABLET | Freq: Every evening | ORAL | Status: DC
  Administered 2015-07-12: 25 mg via ORAL
  Filled 2015-07-12: qty 1

## 2015-07-12 MED ORDER — FENTANYL CITRATE (PF) 250 MCG/5ML IJ SOLN
INTRAMUSCULAR | Status: AC
  Filled 2015-07-12: qty 5

## 2015-07-12 MED ORDER — PIOGLITAZONE HCL 30 MG PO TABS: 30.0000 mg | ORAL_TABLET | Freq: Every day | ORAL | Status: DC

## 2015-07-12 MED ORDER — LIDOCAINE HCL (CARDIAC) 20 MG/ML IV SOLN
INTRAVENOUS | Status: DC | PRN
  Administered 2015-07-12: 40 mg via INTRAVENOUS

## 2015-07-12 MED ORDER — LIDOCAINE-EPINEPHRINE 1 %-1:100000 IJ SOLN
INTRAMUSCULAR | Status: DC | PRN
  Administered 2015-07-12: 2 mL
  Administered 2015-07-12: 10 mL

## 2015-07-12 SURGICAL SUPPLY — 70 items
2% LIDOCAINE WITH EPI ×3 IMPLANT
BAG DECANTER FOR FLEXI CONT (MISCELLANEOUS) ×3 IMPLANT
BENZOIN TINCTURE PRP APPL 2/3 (GAUZE/BANDAGES/DRESSINGS) IMPLANT
BIT DRILL NEURO 2X3.1 SFT TUCH (MISCELLANEOUS) ×1 IMPLANT
BLADE CLIPPER SURG (BLADE) IMPLANT
BLADE SURG 11 STRL SS (BLADE) ×3 IMPLANT
BLADE ULTRA TIP 2M (BLADE) IMPLANT
BRUSH SCRUB EZ 1% IODOPHOR (MISCELLANEOUS) IMPLANT
CAGE-B CERVICAL CAVUX (Cage) ×12 IMPLANT
CANISTER SUCT 3000ML PPV (MISCELLANEOUS) ×3 IMPLANT
CLOSURE WOUND 1/2 X4 (GAUZE/BANDAGES/DRESSINGS) ×1
DECANTER SPIKE VIAL GLASS SM (MISCELLANEOUS) ×6 IMPLANT
DERMABOND ADVANCED (GAUZE/BANDAGES/DRESSINGS) ×2
DERMABOND ADVANCED .7 DNX12 (GAUZE/BANDAGES/DRESSINGS) ×1 IMPLANT
DRAPE C-ARM 42X72 X-RAY (DRAPES) ×6 IMPLANT
DRAPE LAPAROTOMY 100X72 PEDS (DRAPES) ×3 IMPLANT
DRAPE MICROSCOPE LEICA (MISCELLANEOUS) ×3 IMPLANT
DRAPE POUCH INSTRU U-SHP 10X18 (DRAPES) ×3 IMPLANT
DRILL NEURO 2X3.1 SOFT TOUCH (MISCELLANEOUS) ×3
DRSG OPSITE POSTOP 4X6 (GAUZE/BANDAGES/DRESSINGS) ×3 IMPLANT
DURAPREP 26ML APPLICATOR (WOUND CARE) ×3 IMPLANT
ELECT BLADE 4.0 EZ CLEAN MEGAD (MISCELLANEOUS) ×3
ELECT REM PT RETURN 9FT ADLT (ELECTROSURGICAL) ×3
ELECTRODE BLDE 4.0 EZ CLN MEGD (MISCELLANEOUS) ×1 IMPLANT
ELECTRODE REM PT RTRN 9FT ADLT (ELECTROSURGICAL) ×1 IMPLANT
GAUZE SPONGE 4X4 12PLY STRL (GAUZE/BANDAGES/DRESSINGS) IMPLANT
GAUZE SPONGE 4X4 16PLY XRAY LF (GAUZE/BANDAGES/DRESSINGS) IMPLANT
GLOVE BIOGEL PI IND STRL 8.5 (GLOVE) ×1 IMPLANT
GLOVE BIOGEL PI INDICATOR 8.5 (GLOVE) ×2
GLOVE ECLIPSE 8.5 STRL (GLOVE) ×3 IMPLANT
GLOVE EXAM NITRILE LRG STRL (GLOVE) IMPLANT
GLOVE EXAM NITRILE MD LF STRL (GLOVE) IMPLANT
GLOVE EXAM NITRILE XL STR (GLOVE) IMPLANT
GLOVE EXAM NITRILE XS STR PU (GLOVE) IMPLANT
GOWN STRL REUS W/ TWL LRG LVL3 (GOWN DISPOSABLE) IMPLANT
GOWN STRL REUS W/ TWL XL LVL3 (GOWN DISPOSABLE) ×1 IMPLANT
GOWN STRL REUS W/TWL 2XL LVL3 (GOWN DISPOSABLE) ×3 IMPLANT
GOWN STRL REUS W/TWL LRG LVL3 (GOWN DISPOSABLE)
GOWN STRL REUS W/TWL XL LVL3 (GOWN DISPOSABLE) ×2
HEMOSTAT POWDER SURGIFOAM 1G (HEMOSTASIS) ×3 IMPLANT
HEMOSTAT SURGICEL 2X14 (HEMOSTASIS) ×3 IMPLANT
KIT BASIN OR (CUSTOM PROCEDURE TRAY) ×3 IMPLANT
KIT ROOM TURNOVER OR (KITS) ×3 IMPLANT
NEEDLE HYPO 22GX1.5 SAFETY (NEEDLE) ×3 IMPLANT
NEEDLE HYPO 25X1 1.5 SAFETY (NEEDLE) ×3 IMPLANT
NEEDLE SPNL 18GX3.5 QUINCKE PK (NEEDLE) IMPLANT
NS IRRIG 1000ML POUR BTL (IV SOLUTION) ×3 IMPLANT
PACK LAMINECTOMY NEURO (CUSTOM PROCEDURE TRAY) ×3 IMPLANT
PAD ARMBOARD 7.5X6 YLW CONV (MISCELLANEOUS) ×9 IMPLANT
PATTIES SURGICAL .25X.25 (GAUZE/BANDAGES/DRESSINGS) IMPLANT
PIN MAYFIELD SKULL DISP (PIN) ×3 IMPLANT
PROVIDENCE CERVICAL VISUALIZATION HARNESS ×2 IMPLANT
PROVIDENCE DTRAX SPINAL SYSTEM ×2 IMPLANT
PUTTY DBX 1CC (Putty) ×6 IMPLANT
PUTTY DBX 1CC DEPUY (Putty) ×2 IMPLANT
RUBBERBAND STERILE (MISCELLANEOUS) ×6 IMPLANT
SCREW BONE ALLY (Screw) ×8 IMPLANT
SPONGE LAP 4X18 X RAY DECT (DISPOSABLE) IMPLANT
SPONGE SURGIFOAM ABS GEL SZ50 (HEMOSTASIS) ×3 IMPLANT
STAPLER SKIN PROX WIDE 3.9 (STAPLE) ×6 IMPLANT
STRIP CLOSURE SKIN 1/2X4 (GAUZE/BANDAGES/DRESSINGS) ×2 IMPLANT
SUT ETHILON 3 0 FSL (SUTURE) IMPLANT
SUT VIC AB 0 CT1 18XCR BRD8 (SUTURE) ×1 IMPLANT
SUT VIC AB 0 CT1 8-18 (SUTURE) ×2
SUT VIC AB 2-0 CP2 18 (SUTURE) ×3 IMPLANT
SUT VIC AB 3-0 SH 8-18 (SUTURE) IMPLANT
TOWEL OR 17X24 6PK STRL BLUE (TOWEL DISPOSABLE) ×3 IMPLANT
TOWEL OR 17X26 10 PK STRL BLUE (TOWEL DISPOSABLE) ×3 IMPLANT
TRAY FOLEY W/METER SILVER 14FR (SET/KITS/TRAYS/PACK) IMPLANT
WATER STERILE IRR 1000ML POUR (IV SOLUTION) ×3 IMPLANT

## 2015-07-12 NOTE — Progress Notes (Signed)
Patient ID: Arthur Mata, male   DOB: [birth year], 60 y.o.   MRN: [id number] Vital signs are stable Patient seems to be tolerating surgery well Still has right upper extremity pain Strength is about the same with mild weakness of intrinsics Patient may be discharged in a.m. if stable

## 2015-07-12 NOTE — Anesthesia Procedure Notes (Signed)
Procedure Name: Intubation Date/Time: 07/12/2015 2:26 PM Performed by: Ollen Bowl Pre-anesthesia Checklist: Patient identified, Emergency Drugs available, Suction available, Patient being monitored and Timeout performed Patient Re-evaluated:Patient Re-evaluated prior to inductionOxygen Delivery Method: Circle system utilized and Simple face mask Preoxygenation: Pre-oxygenation with 100% oxygen Intubation Type: IV induction Ventilation: Mask ventilation with difficulty and Oral airway inserted - appropriate to patient size Laryngoscope Size: Glidescope and 4 Grade View: Grade I Tube type: Oral Tube size: 7.5 mm Number of attempts: 1 Airway Equipment and Method: Patient positioned with wedge pillow,  Video-laryngoscopy and Rigid stylet Placement Confirmation: ETT inserted through vocal cords under direct vision,  positive ETCO2 and breath sounds checked- equal and bilateral Secured at: 22 cm Tube secured with: Tape Dental Injury: Teeth and Oropharynx as per pre-operative assessment
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
## 2015-07-12 NOTE — Progress Notes (Signed)
Orthopedic Tech Progress Note Patient Details:  Arthur Mata [birth year] CM:[id number]  Ortho Devices Type of Ortho Device: Soft collar Ortho Device/Splint Location: neck Ortho Device/Splint Interventions: Ordered, Application   Braulio Bosch 07/12/2015, 6:18 PM
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
## 2015-07-12 NOTE — Anesthesia Preprocedure Evaluation (Addendum)
Anesthesia Evaluation  Patient identified by MRN, date of birth, ID band Patient awake    Reviewed: Allergy & Precautions, NPO status , Patient's Chart, lab work & pertinent test results, reviewed documented beta blocker date and time   Airway Mallampati: III  TM Distance: >3 FB Neck ROM: Full    Dental  (+) Partial Upper, Dental Advisory Given   Pulmonary COPD, Current Smoker, former smoker,    breath sounds clear to auscultation       Cardiovascular hypertension, Pt. on medications and Pt. on home beta blockers + angina with exertion + CAD   Rhythm:Regular Rate:Normal  Nuclear stress test 03/13/14: Normal stress nuclear study. LV Ejection Fraction: 63%. LV Wall Motion: NL LV Function; NL Wall Motion  Cardiac cath 11/17/12 ([place], [place]): - LM patent - mid LAD 30% lesion - LCx prox 40% lesion - RCA: mid and distal 20-30% lesion - EF 65%   Neuro/Psych  Headaches, negative psych ROS   GI/Hepatic negative GI ROS, Neg liver ROS,   Endo/Other  diabetes, Insulin Dependent  Renal/GU negative Renal ROS  negative genitourinary   Musculoskeletal negative musculoskeletal ROS (+)   Abdominal   Peds negative pediatric ROS (+)  Hematology negative hematology ROS (+)   Anesthesia Other Findings - HLD   Reproductive/Obstetrics negative OB ROS                            Lab Results  Component Value Date   WBC 9.2 07/09/2015   HGB 15.3 07/09/2015   HCT 44.9 07/09/2015   MCV 89.8 07/09/2015   PLT 198 07/09/2015   Lab Results  Component Value Date   CREATININE 1.05 07/09/2015   BUN 12 07/09/2015   NA 134* 07/09/2015   K 4.5 07/09/2015   CL 101 07/09/2015   CO2 25 07/09/2015   No results found for: INR, PROTIME  03/2015 EKG: sinus bradycardia.   Anesthesia Physical  Anesthesia Plan  ASA: III  Anesthesia Plan: General   Post-op Pain Management:    Induction:  Intravenous  Airway Management Planned: Oral ETT and Video Laryngoscope Planned  Additional Equipment:   Intra-op Plan:   Post-operative Plan: Extubation in OR  Informed Consent: I have reviewed the patients History and Physical, chart, labs and discussed the procedure including the risks, benefits and alternatives for the proposed anesthesia with the patient or authorized representative who has indicated his/her understanding and acceptance.   Dental advisory given  Plan Discussed with: CRNA  Anesthesia Plan Comments:         Anesthesia Quick Evaluation

## 2015-07-12 NOTE — Transfer of Care (Signed)
Immediate Anesthesia Transfer of Care Note  Patient: Arthur Mata  Procedure(s) Performed: Procedure(s) with comments: Cervical five-six Cervical seven-Thoracic one Posterior cervical fusion with instrumention (N/A) - C5-6 C7-T1 Posterior cervical fusion with lateral mass fixation  Patient Location: PACU  Anesthesia Type:General  Level of Consciousness: awake, alert  and oriented  Airway & Oxygen Therapy: Patient Spontanous Breathing and Patient connected to nasal cannula oxygen  Post-op Assessment: Report given to RN, Post -op Vital signs reviewed and stable and Patient moving all extremities X 4  Post vital signs: Reviewed and stable  Last Vitals:  Filed Vitals:   07/12/15 1715 07/12/15 1716  BP:  147/65  Pulse:  76  Temp: 36.4 C   Resp:  13    Last Pain:  Filed Vitals:   07/12/15 1720  PainSc: 0-No pain         Complications: No apparent anesthesia complications

## 2015-07-13 LAB — GLUCOSE, CAPILLARY
Glucose-Capillary: 191 mg/dL — ABNORMAL HIGH (ref 65–99)
Glucose-Capillary: 188 mg/dL — ABNORMAL HIGH (ref 65–99)

## 2015-07-13 NOTE — Discharge Instructions (Signed)
Spinal Fusion, Care After °Refer to this sheet in the next few weeks. These instructions provide you with information on caring for yourself after your procedure. Your caregiver may also give you more specific instructions. Your treatment has been planned according to current medical practices, but problems sometimes occur. Call your caregiver if you have any problems or questions after your procedure. °HOME CARE INSTRUCTIONS  °· Take whatever pain medicine has been prescribed by your caregiver. Do not take over-the-counter pain medicine unless directed otherwise by your caregiver. °· Do not drive if you are taking narcotic pain medicines. °· Change your bandage (dressing) if necessary or as directed by your caregiver. °· Do not get your surgical cut (incision) wet. After a few days you may take quick showers (rather than baths), but keep your incision clean and dry. Covering the incision with plastic wrap while you shower should keep your incision dry. A few weeks after surgery, once your incision has healed and your caregiver says it is okay, you can take baths or go swimming. °· If you have been prescribed medicine to prevent your blood from clotting, follow the directions carefully. °· Check the area around your incision often. Look for redness and swelling. Also, look for anything leaking from your wound. You can use a mirror or have a family member inspect your incision if it is in a place where it is difficult for you to see. °· Ask your caregiver what activities you should avoid and for how long. °· Walk as much as possible. °· Do not lift anything heavier than 10 pounds (4.5 kilograms) until your caregiver says it is safe. °· Do not twist or bend for a few weeks. Try not to pull on things. Avoid sitting for long periods of time. Change positions at least every hour. °· Ask your caregiver what kinds of exercise you should do to make your back stronger and when you should begin doing these exercises. °SEEK  IMMEDIATE MEDICAL CARE IF:  °· Pain suddenly becomes much worse. °· The incision area is red, swollen, bleeding, or leaking fluid. °· Your legs or feet become increasingly painful, numb, weak, or swollen. °· You have trouble controlling urination or bowel movements. °· You have trouble breathing. °· You have chest pain. °· You have a fever. °MAKE SURE YOU: °· Understand these instructions. °· Will watch your condition. °· Will get help right away if you are not doing well or get worse. °  °This information is not intended to replace advice given to you by your health care provider. Make sure you discuss any questions you have with your health care provider. °  °Document Released: 08/29/2004 Document Revised: 03/02/2014 Document Reviewed: 07/25/2014 °Elsevier Interactive Patient Education ©2016 [employer] ° °

## 2015-07-13 NOTE — Discharge Summary (Signed)
Physician Discharge Summary  Patient ID: Arthur Mata MRN: [id number] DOB/AGE: [birth year] 60 y.o.  Admit date: 07/12/2015 Discharge date: 07/13/2015  Admission Diagnoses:cervical radiculopathies  Discharge Diagnoses:  Active Problems:   Cervical radiculopathy at C8   Discharged Condition: incisional pain  Hospital Course: surgery  Consults: none  Significant Diagnostic Studies: mri  Treatments: posterior fusion  Discharge Exam: Blood pressure 141/57, pulse 81, temperature 99.7 F (37.6 C), temperature source Oral, resp. rate 17, height 5' 10.75" (1.797 m), weight 107.956 kg (238 lb), SpO2 93 %. No weakness. Patient wants to go home  Disposition: home     Medication List    ASK your doctor about these medications        alprazolam 2 MG tablet  Commonly known as:  XANAX  Take 2 mg by mouth 3 (three) times daily as needed for anxiety.     aspirin 81 MG tablet  Take 81 mg by mouth every evening.     atorvastatin 40 MG tablet  Commonly known as:  LIPITOR  Take 40 mg by mouth every evening.     BAYER CONTOUR NEXT TEST test strip  Generic drug:  glucose blood  USE TO CHECK BLOOD SUGAR 5 TIMES PER DAY     buPROPion 150 MG 12 hr tablet  Commonly known as:  WELLBUTRIN SR  Take 150 mg by mouth 2 (two) times daily.     diazepam 5 MG tablet  Commonly known as:  VALIUM  Take 1 tablet (5 mg total) by mouth every 6 (six) hours as needed for muscle spasms.     digoxin 0.25 MG tablet  Commonly known as:  LANOXIN  Take 1 tablet (0.25 mg total) by mouth daily.     gabapentin 600 MG tablet  Commonly known as:  NEURONTIN  Take 600 mg by mouth at bedtime.     Insulin Lispro 200 UNIT/ML Sopn  Commonly known as:  HUMALOG KWIKPEN  Inject 130 Units into the skin 3 (three) times daily. Inject 50 units at breakfast, 30 units at lunch and 50 units at dinner     Insulin Pen Needle 32G X 4 MM Misc  Commonly known as:  BD PEN NEEDLE NANO U/F  Use to inject insulin 4 times daily  as instructed.     Liraglutide 18 MG/3ML Sopn  Commonly known as:  VICTOZA  Inject 1.8 mg daily     lisinopril 5 MG tablet  Commonly known as:  PRINIVIL,ZESTRIL  Take 5 mg by mouth every evening.     metoprolol succinate 25 MG 24 hr tablet  Commonly known as:  TOPROL-XL  Take 25 mg by mouth every evening.     nitroGLYCERIN 0.4 MG SL tablet  Commonly known as:  NITROSTAT  Place 1 tablet (0.4 mg total) under the tongue every 5 (five) minutes as needed for chest pain.     oxyCODONE-acetaminophen 5-325 MG tablet  Commonly known as:  PERCOCET/ROXICET  Take 1-2 tablets by mouth every 4 (four) hours as needed for moderate pain.     pioglitazone 30 MG tablet  Commonly known as:  ACTOS  TAKE 1 TABLET BY MOUTH DAILY     TOUJEO SOLOSTAR 300 UNIT/ML Sopn  Generic drug:  Insulin Glargine  INJECT 120 UNITS INTO THE SKIN DAILY BEFORE BREAKFAST.         Signed: Floyce Stakes 07/13/2015, 9:27 AM

## 2015-07-13 NOTE — Progress Notes (Signed)
Pt discharged home with family, by car, assessment stable, hand written prescriptions given, discharge instructions reviewed, all questions answered. IV removed. Pt taken by wheelchair to exit. Time of discharge: 1023

## 2015-07-13 NOTE — Evaluation (Signed)
Occupational Therapy Evaluation and Discharge Patient Details Name: Arthur Mata MRN: [id number] DOB: 1955/12/24 Today's Date: 07/13/2015    History of Present Illness Pt is a 60 y.o. male s/p Posterior supplemental fixation and fusion C5-C6 with allograft and posterior lateral fusion C7-T1 with allograft, with decompression of the right C8 nerve root via laminotomy and foraminotomies. PMHx: DM, Chest pain, HTN, Angina pectoris, Hyperlipidemia, Near syncope, Obesity, CAD, COPD, Arithritis, Lumbar fusion in 2015, ACDF x 2 04/26/2015.       Clinical Impression   Pt reports he was independent with ADLs and functional mobility PTA. Currently pt is overall mod I with ADLs and functional mobility. Pt without LOB or unsteadiness during all activities in session. All cervical, safety, and ADL education complete; pt verbalized understanding and reports he recalls information from previous sx. Pt c/o pain and impaired sensation in his RUE with decreased grip strength in his R hand; reports this was present PTA without change post-op and does not impact him functionally. Pt planning to d/c home with 24/7 supervision initially. No further acute OT needs identified; signing off at this time. Please re-consult if needs change. Thank you for this referral.    Follow Up Recommendations  No OT follow up;Supervision - Intermittent    Equipment Recommendations  None recommended by OT    Recommendations for Other Services       Precautions / Restrictions Precautions Precautions: Cervical Required Braces or Orthoses: Cervical Brace Cervical Brace: Soft collar Restrictions Weight Bearing Restrictions: No      Mobility Bed Mobility Overal bed mobility: Modified Independent             General bed mobility comments: Able to demo log roll technique with no physical assist.  Transfers Overall transfer level: Modified independent Equipment used: None             General transfer comment: No  unsteadiness or LOB noted.    Balance Overall balance assessment: No apparent balance deficits (not formally assessed)                                          ADL Overall ADL's : Modified independent                                     Functional mobility during ADLs: Modified independent General ADL Comments: Pt found with soft collar doffed; discussed importance of collar. Pt verbalized understanding and reports he wears it any time when he is out of the house and knows proper body mechanics and not moving neck too much from prior cervical sx.      Vision     Perception     Praxis      Pertinent Vitals/Pain Pain Assessment: Faces Faces Pain Scale: Hurts even more Pain Location: chronic pain R UE Pain Descriptors / Indicators: Aching;Tingling;Discomfort Pain Intervention(s): Monitored during session     Hand Dominance Right   Extremity/Trunk Assessment Upper Extremity Assessment Upper Extremity Assessment: RUE deficits/detail RUE Deficits / Details: Pain with shoulder AROM. MMT at least 3/5 overall. Slight decrease in grip strength. Numbness and tingling throughout RUE. All present PTA. RUE: Unable to fully assess due to pain RUE Sensation: decreased light touch   Lower Extremity Assessment Lower Extremity Assessment: Overall WFL for tasks assessed   Cervical /  Trunk Assessment Cervical / Trunk Assessment: Other exceptions Cervical / Trunk Exceptions: hx of multiple back sx   Communication Communication Communication: No difficulties   Cognition Arousal/Alertness: Awake/alert Behavior During Therapy: WFL for tasks assessed/performed Overall Cognitive Status: Within Functional Limits for tasks assessed                     General Comments       Exercises       Shoulder Instructions      Home Living Family/patient expects to be discharged to:: Private residence Living Arrangements: Spouse/significant  other Available Help at Discharge: Family;Available 24 hours/day (wife will be home until Tuesday) Type of Home: House Home Access: Stairs to enter [employer] of Steps: 1   Home Layout: One level     Bathroom Shower/Tub: Occupational psychologist: Handicapped height     Home Equipment: Environmental consultant - 2 wheels;Cane - single point;Shower seat - built in;Grab bars - tub/shower;Hand held shower head          Prior Functioning/Environment Level of Independence: Independent             OT Diagnosis: Generalized weakness;Acute pain   OT Problem List:     OT Treatment/Interventions:      OT Goals(Current goals can be found in the care plan section) Acute Rehab OT Goals Patient Stated Goal: home today OT Goal Formulation: All assessment and education complete, DC therapy  OT Frequency:     Barriers to D/C:            Co-evaluation              End of Session Nurse Communication: Mobility status  Activity Tolerance: Patient tolerated treatment well Patient left: in bed;with call bell/phone within reach;with family/visitor present   Time: [id number] OT Time Calculation (min): 21 min Charges:  OT General Charges $OT Visit: 1 Procedure OT Evaluation $OT Eval Low Complexity: 1 Procedure G-Codes:     Binnie Kand M.S., OTR/L Pager: [phone]  07/13/2015, 9:25 AM

## 2015-07-15 NOTE — Anesthesia Postprocedure Evaluation (Signed)
Anesthesia Post Note  Patient: Arthur Mata  Procedure(s) Performed: Procedure(s) (LRB): Cervical five-six Cervical seven-Thoracic one Posterior cervical fusion with instrumention (N/A)  Patient location during evaluation: PACU Anesthesia Type: General Level of consciousness: awake and alert Pain management: pain level controlled Vital Signs Assessment: post-procedure vital signs reviewed and stable Respiratory status: spontaneous breathing, nonlabored ventilation, respiratory function stable and patient connected to nasal cannula oxygen Cardiovascular status: blood pressure returned to baseline and stable Postop Assessment: no signs of nausea or vomiting Anesthetic complications: no     Last Vitals:  Filed Vitals:   07/13/15 0845 07/13/15 1007  BP: 141/57 144/61  Pulse: 81 83  Temp: 37.6 C 36.6 C  Resp: 17 18    Last Pain:  Filed Vitals:   07/13/15 1008  PainSc: 8    Pain Goal: Patients Stated Pain Goal: 4 (07/12/15 1930)               Tiajuana Amass

## 2015-07-16 ENCOUNTER — Encounter (HOSPITAL_COMMUNITY): Payer: Self-pay | Admitting: Neurological Surgery

## 2015-07-17 DIAGNOSIS — M5412 Radiculopathy, cervical region: Secondary | ICD-10-CM | POA: Diagnosis not present

## 2015-07-17 DIAGNOSIS — I10 Essential (primary) hypertension: Secondary | ICD-10-CM | POA: Diagnosis not present

## 2015-07-17 DIAGNOSIS — Z6834 Body mass index (BMI) 34.0-34.9, adult: Secondary | ICD-10-CM | POA: Diagnosis not present

## 2015-07-17 MED FILL — METOPROLOL SUCC ER 25 MG TA: 25 | 90 days supply | Qty: 90 | Fill #0

## 2015-07-17 MED FILL — ALPRAZolam 2 MG TABS: 2 | 30 days supply | Qty: 90 | Fill #0

## 2015-07-18 ENCOUNTER — Other Ambulatory Visit: Payer: Self-pay | Admitting: Neurological Surgery

## 2015-07-18 ENCOUNTER — Encounter (HOSPITAL_COMMUNITY): Payer: Self-pay | Admitting: *Deleted

## 2015-07-18 NOTE — Progress Notes (Signed)
Pt just had surgery here on 07/12/15. Spoke with pt and he verifies that his allergies, medical and surgical history have not changed. Pt states his fasting blood sugar has been running between 160-170. Pt requested that I give his wife, Arthur Mata instructions. I instructed her to have pt to take 1/2 of his regular dose of Toujeo tonight. Instructed her to have pt check his blood sugar when he gets up in the AM and every 2 hours until he leaves for the hospital. If blood sugar is >220 take 1/2 of usual correction dose of Humalog insulin. If blood sugar is 70 or below, treat with 1/2 cup of clear juice (apple or cranberry) and recheck blood sugar 15 minutes after drinking juice. If blood sugar continues to be 70 or below, call the Short Stay department and ask to speak to a nurse. Pt and wife were instructed by Dr. Ellene Route that pt could eat a light breakfast by 8 AM tomorrow and then be NPO.

## 2015-07-19 ENCOUNTER — Encounter (HOSPITAL_COMMUNITY)
Admission: RE | Disposition: A | Discharge status: Home / Self Care | Payer: Self-pay | Source: Ambulatory Visit | Attending: Neurological Surgery

## 2015-07-19 ENCOUNTER — Encounter (HOSPITAL_COMMUNITY): Payer: Self-pay

## 2015-07-19 ENCOUNTER — Encounter (HOSPITAL_COMMUNITY): Payer: Self-pay | Admitting: *Deleted

## 2015-07-19 ENCOUNTER — Ambulatory Visit (HOSPITAL_COMMUNITY): Payer: 59 | Admitting: Anesthesiology

## 2015-07-19 ENCOUNTER — Ambulatory Visit (HOSPITAL_COMMUNITY)
Admission: RE | Admit: 2015-07-19 | Discharge: 2015-07-19 | Disposition: A | Discharge status: Home / Self Care | Payer: 59 | Source: Ambulatory Visit | Attending: Neurological Surgery | Admitting: Neurological Surgery

## 2015-07-19 ENCOUNTER — Observation Stay (HOSPITAL_COMMUNITY)
Admission: RE | Admit: 2015-07-19 | Discharge: 2015-07-20 | Disposition: A | Discharge status: Home / Self Care | Payer: 59 | Source: Ambulatory Visit | Attending: Neurological Surgery | Admitting: Neurological Surgery

## 2015-07-19 ENCOUNTER — Other Ambulatory Visit (HOSPITAL_COMMUNITY): Payer: Self-pay | Admitting: Neurological Surgery

## 2015-07-19 DIAGNOSIS — M4322 Fusion of spine, cervical region: Secondary | ICD-10-CM | POA: Diagnosis not present

## 2015-07-19 DIAGNOSIS — Z87891 Personal history of nicotine dependence: Secondary | ICD-10-CM | POA: Insufficient documentation

## 2015-07-19 DIAGNOSIS — Z981 Arthrodesis status: Secondary | ICD-10-CM

## 2015-07-19 DIAGNOSIS — G5621 Lesion of ulnar nerve, right upper limb: Principal | ICD-10-CM | POA: Diagnosis present

## 2015-07-19 DIAGNOSIS — I2510 Atherosclerotic heart disease of native coronary artery without angina pectoris: Secondary | ICD-10-CM | POA: Diagnosis not present

## 2015-07-19 DIAGNOSIS — G5601 Carpal tunnel syndrome, right upper limb: Secondary | ICD-10-CM | POA: Insufficient documentation

## 2015-07-19 DIAGNOSIS — J449 Chronic obstructive pulmonary disease, unspecified: Secondary | ICD-10-CM | POA: Insufficient documentation

## 2015-07-19 DIAGNOSIS — E119 Type 2 diabetes mellitus without complications: Secondary | ICD-10-CM | POA: Insufficient documentation

## 2015-07-19 DIAGNOSIS — M5412 Radiculopathy, cervical region: Secondary | ICD-10-CM

## 2015-07-19 DIAGNOSIS — E785 Hyperlipidemia, unspecified: Secondary | ICD-10-CM | POA: Diagnosis not present

## 2015-07-19 DIAGNOSIS — I10 Essential (primary) hypertension: Secondary | ICD-10-CM | POA: Insufficient documentation

## 2015-07-19 HISTORY — PX: CARPAL TUNNEL RELEASE: SHX101

## 2015-07-19 HISTORY — PX: ULNAR NERVE TRANSPOSITION: SHX2595

## 2015-07-19 LAB — GLUCOSE, CAPILLARY
Glucose-Capillary: 182 mg/dL — ABNORMAL HIGH (ref 65–99)
Glucose-Capillary: 147 mg/dL — ABNORMAL HIGH (ref 65–99)
Glucose-Capillary: 163 mg/dL — ABNORMAL HIGH (ref 65–99)

## 2015-07-19 SURGERY — CARPAL TUNNEL RELEASE
Anesthesia: General | Site: Wrist | Laterality: Right

## 2015-07-19 MED ORDER — ATORVASTATIN CALCIUM 40 MG PO TABS: 40.0000 mg | ORAL_TABLET | Freq: Every evening | ORAL | Status: DC

## 2015-07-19 MED ORDER — LIDOCAINE-EPINEPHRINE 1 %-1:100000 IJ SOLN
INTRAMUSCULAR | Status: DC | PRN
  Administered 2015-07-19: 10 mL

## 2015-07-19 MED ORDER — FENTANYL CITRATE (PF) 250 MCG/5ML IJ SOLN
INTRAMUSCULAR | Status: AC
  Filled 2015-07-19: qty 5

## 2015-07-19 MED ORDER — MIDAZOLAM HCL 2 MG/2ML IJ SOLN
INTRAMUSCULAR | Status: AC
  Filled 2015-07-19: qty 2

## 2015-07-19 MED ORDER — KETOROLAC TROMETHAMINE 30 MG/ML IJ SOLN
INTRAMUSCULAR | Status: AC
  Filled 2015-07-19: qty 1

## 2015-07-19 MED ORDER — FENTANYL CITRATE (PF) 100 MCG/2ML IJ SOLN
INTRAMUSCULAR | Status: AC
  Filled 2015-07-19: qty 2

## 2015-07-19 MED ORDER — ACETAMINOPHEN 325 MG PO TABS: 650.0000 mg | ORAL_TABLET | ORAL | Status: DC | PRN

## 2015-07-19 MED ORDER — METHOCARBAMOL 500 MG PO TABS
500.0000 mg | ORAL_TABLET | Freq: Four times a day (QID) | ORAL | Status: DC | PRN
  Administered 2015-07-19: 500 mg via ORAL

## 2015-07-19 MED ORDER — GABAPENTIN 600 MG PO TABS
600.0000 mg | ORAL_TABLET | Freq: Every day | ORAL | Status: DC
  Administered 2015-07-20: 600 mg via ORAL
  Filled 2015-07-19: qty 1

## 2015-07-19 MED ORDER — METHOCARBAMOL 500 MG PO TABS
ORAL_TABLET | ORAL | Status: AC
  Filled 2015-07-19: qty 1

## 2015-07-19 MED ORDER — BUPROPION HCL ER (SR) 150 MG PO TB12
150.0000 mg | ORAL_TABLET | Freq: Two times a day (BID) | ORAL | Status: DC
  Filled 2015-07-19: qty 1

## 2015-07-19 MED ORDER — ONDANSETRON HCL 4 MG/2ML IJ SOLN
INTRAMUSCULAR | Status: AC
  Filled 2015-07-19: qty 2

## 2015-07-19 MED ORDER — INSULIN ASPART 100 UNIT/ML ~~LOC~~ SOLN
30.0000 [IU] | Freq: Three times a day (TID) | SUBCUTANEOUS | Status: DC
  Administered 2015-07-20: 30 [IU] via SUBCUTANEOUS

## 2015-07-19 MED ORDER — SODIUM CHLORIDE 0.9 % IV SOLN
250.0000 mL | INTRAVENOUS | Status: DC
  Administered 2015-07-20: 250 mL via INTRAVENOUS

## 2015-07-19 MED ORDER — ALUM & MAG HYDROXIDE-SIMETH 200-200-20 MG/5ML PO SUSP: 30.0000 mL | Freq: Four times a day (QID) | ORAL | Status: DC | PRN

## 2015-07-19 MED ORDER — OXYCODONE-ACETAMINOPHEN 10-325 MG PO TABS: 1.0000 | ORAL_TABLET | ORAL | Status: DC | PRN

## 2015-07-19 MED ORDER — DOCUSATE SODIUM 100 MG PO CAPS
100.0000 mg | ORAL_CAPSULE | Freq: Two times a day (BID) | ORAL | Status: DC
  Filled 2015-07-19: qty 1

## 2015-07-19 MED ORDER — ONDANSETRON HCL 4 MG/2ML IJ SOLN
4.0000 mg | INTRAMUSCULAR | Status: DC | PRN
  Administered 2015-07-20: 4 mg via INTRAVENOUS
  Filled 2015-07-19: qty 2

## 2015-07-19 MED ORDER — INSULIN GLARGINE 100 UNIT/ML ~~LOC~~ SOLN
120.0000 [IU] | Freq: Every evening | SUBCUTANEOUS | Status: DC
  Filled 2015-07-19 (×2): qty 1.2

## 2015-07-19 MED ORDER — INSULIN PEN NEEDLE 32G X 4 MM MISC: Freq: Three times a day (TID) | Status: DC

## 2015-07-19 MED ORDER — CEFAZOLIN SODIUM-DEXTROSE 2-3 GM-% IV SOLR
INTRAVENOUS | Status: DC | PRN
  Administered 2015-07-19: 2 g via INTRAVENOUS

## 2015-07-19 MED ORDER — FLEET ENEMA 7-19 GM/118ML RE ENEM: 1.0000 | ENEMA | Freq: Once | RECTAL | Status: DC | PRN

## 2015-07-19 MED ORDER — PROPOFOL 10 MG/ML IV BOLUS
INTRAVENOUS | Status: AC
  Filled 2015-07-19: qty 40

## 2015-07-19 MED ORDER — ALPRAZOLAM 0.5 MG PO TABS
2.0000 mg | ORAL_TABLET | Freq: Three times a day (TID) | ORAL | Status: DC | PRN
  Administered 2015-07-20: 2 mg via ORAL
  Filled 2015-07-19: qty 4

## 2015-07-19 MED ORDER — ACETAMINOPHEN 650 MG RE SUPP: 650.0000 mg | RECTAL | Status: DC | PRN

## 2015-07-19 MED ORDER — DIGOXIN 125 MCG PO TABS
0.2500 mg | ORAL_TABLET | Freq: Every evening | ORAL | Status: DC
  Filled 2015-07-19: qty 2

## 2015-07-19 MED ORDER — ONDANSETRON HCL 4 MG/2ML IJ SOLN
INTRAMUSCULAR | Status: DC | PRN
  Administered 2015-07-19: 4 mg via INTRAVENOUS

## 2015-07-19 MED ORDER — KETOROLAC TROMETHAMINE 30 MG/ML IJ SOLN
30.0000 mg | Freq: Once | INTRAMUSCULAR | Status: AC | PRN
  Administered 2015-07-19: 30 mg via INTRAVENOUS

## 2015-07-19 MED ORDER — OXYCODONE HCL 5 MG PO TABS
5.0000 mg | ORAL_TABLET | ORAL | Status: DC | PRN
  Administered 2015-07-20 (×2): 5 mg via ORAL
  Filled 2015-07-19 (×2): qty 1

## 2015-07-19 MED ORDER — LIDOCAINE 2% (20 MG/ML) 5 ML SYRINGE
INTRAMUSCULAR | Status: DC | PRN
  Administered 2015-07-19: 60 mg via INTRAVENOUS

## 2015-07-19 MED ORDER — PHENOL 1.4 % MT LIQD: 1.0000 | OROMUCOSAL | Status: DC | PRN

## 2015-07-19 MED ORDER — CEFAZOLIN SODIUM 1-5 GM-% IV SOLN
1.0000 g | Freq: Three times a day (TID) | INTRAVENOUS | Status: DC
  Administered 2015-07-20: 1 g via INTRAVENOUS
  Filled 2015-07-19 (×2): qty 50

## 2015-07-19 MED ORDER — OXYCODONE-ACETAMINOPHEN 5-325 MG PO TABS
1.0000 | ORAL_TABLET | ORAL | Status: DC | PRN
  Administered 2015-07-20 (×2): 1 via ORAL
  Filled 2015-07-19 (×2): qty 1

## 2015-07-19 MED ORDER — LACTATED RINGERS IV SOLN
INTRAVENOUS | Status: DC
  Administered 2015-07-19 (×2): via INTRAVENOUS

## 2015-07-19 MED ORDER — FENTANYL CITRATE (PF) 100 MCG/2ML IJ SOLN
25.0000 ug | INTRAMUSCULAR | Status: DC | PRN
  Administered 2015-07-19: 25 ug via INTRAVENOUS

## 2015-07-19 MED ORDER — METOPROLOL SUCCINATE ER 25 MG PO TB24
25.0000 mg | ORAL_TABLET | Freq: Every day | ORAL | Status: DC
  Filled 2015-07-19: qty 1

## 2015-07-19 MED ORDER — BACITRACIN 50000 UNITS IM SOLR
INTRAMUSCULAR | Status: DC | PRN
  Administered 2015-07-19: 18:00:00

## 2015-07-19 MED ORDER — BISACODYL 10 MG RE SUPP: 10.0000 mg | Freq: Every day | RECTAL | Status: DC | PRN

## 2015-07-19 MED ORDER — LISINOPRIL 5 MG PO TABS: 5.0000 mg | ORAL_TABLET | Freq: Every evening | ORAL | Status: DC

## 2015-07-19 MED ORDER — SODIUM CHLORIDE 0.9% FLUSH
3.0000 mL | Freq: Two times a day (BID) | INTRAVENOUS | Status: DC
  Administered 2015-07-20: 3 mL via INTRAVENOUS

## 2015-07-19 MED ORDER — ROCURONIUM BROMIDE 50 MG/5ML IV SOLN
INTRAVENOUS | Status: AC
  Filled 2015-07-19: qty 1

## 2015-07-19 MED ORDER — FENTANYL CITRATE (PF) 250 MCG/5ML IJ SOLN
INTRAMUSCULAR | Status: DC | PRN
  Administered 2015-07-19 (×3): 50 ug via INTRAVENOUS
  Administered 2015-07-19: 100 ug via INTRAVENOUS

## 2015-07-19 MED ORDER — MENTHOL 3 MG MT LOZG: 1.0000 | LOZENGE | OROMUCOSAL | Status: DC | PRN

## 2015-07-19 MED ORDER — BACITRACIN ZINC 500 UNIT/GM EX OINT
TOPICAL_OINTMENT | CUTANEOUS | Status: DC | PRN
Start: 2015-07-19 — End: 2015-07-19
  Administered 2015-07-19: 1 via TOPICAL

## 2015-07-19 MED ORDER — PROPOFOL 10 MG/ML IV BOLUS
INTRAVENOUS | Status: DC | PRN
  Administered 2015-07-19: 200 mg via INTRAVENOUS

## 2015-07-19 MED ORDER — SODIUM CHLORIDE 0.9% FLUSH: 3.0000 mL | INTRAVENOUS | Status: DC | PRN

## 2015-07-19 MED ORDER — LIRAGLUTIDE 18 MG/3ML ~~LOC~~ SOPN
18.0000 mg | PEN_INJECTOR | Freq: Every day | SUBCUTANEOUS | Status: DC
Start: 2015-07-20 — End: 2015-07-20

## 2015-07-19 MED ORDER — BUPIVACAINE HCL (PF) 0.25 % IJ SOLN
INTRAMUSCULAR | Status: DC | PRN
  Administered 2015-07-19: 20 mL
  Administered 2015-07-19: 10 mL

## 2015-07-19 MED ORDER — MORPHINE SULFATE (PF) 2 MG/ML IV SOLN
1.0000 mg | INTRAVENOUS | Status: DC | PRN
  Administered 2015-07-20: 2 mg via INTRAVENOUS
  Filled 2015-07-19: qty 1

## 2015-07-19 MED ORDER — SENNA 8.6 MG PO TABS
1.0000 | ORAL_TABLET | Freq: Two times a day (BID) | ORAL | Status: DC
  Filled 2015-07-19: qty 1

## 2015-07-19 MED ORDER — ASPIRIN EC 81 MG PO TBEC
81.0000 mg | DELAYED_RELEASE_TABLET | Freq: Every evening | ORAL | Status: DC
Start: 2015-07-19 — End: 2015-07-20

## 2015-07-19 MED ORDER — POLYETHYLENE GLYCOL 3350 17 G PO PACK: 17.0000 g | PACK | Freq: Every day | ORAL | Status: DC | PRN

## 2015-07-19 MED ORDER — DIAZEPAM 5 MG PO TABS
5.0000 mg | ORAL_TABLET | Freq: Four times a day (QID) | ORAL | Status: DC | PRN
  Administered 2015-07-20 (×2): 5 mg via ORAL
  Filled 2015-07-19 (×2): qty 1

## 2015-07-19 MED ORDER — NITROGLYCERIN 0.4 MG SL SUBL: 0.4000 mg | SUBLINGUAL_TABLET | SUBLINGUAL | Status: DC | PRN

## 2015-07-19 MED ORDER — MIDAZOLAM HCL 2 MG/2ML IJ SOLN
INTRAMUSCULAR | Status: DC | PRN
  Administered 2015-07-19: 2 mg via INTRAVENOUS

## 2015-07-19 SURGICAL SUPPLY — 50 items
BAG DECANTER FOR FLEXI CONT (MISCELLANEOUS) ×4 IMPLANT
BANDAGE ACE 3X5.8 VEL STRL LF (GAUZE/BANDAGES/DRESSINGS) ×4 IMPLANT
BANDAGE ACE 4X5 VEL STRL LF (GAUZE/BANDAGES/DRESSINGS) ×4 IMPLANT
BANDAGE GAUZE 4  KLING STR (GAUZE/BANDAGES/DRESSINGS) IMPLANT
BLADE SURG 15 STRL LF DISP TIS (BLADE) IMPLANT
BLADE SURG 15 STRL SS (BLADE)
BNDG GAUZE ELAST 4 BULKY (GAUZE/BANDAGES/DRESSINGS) ×8 IMPLANT
CORDS BIPOLAR (ELECTRODE) IMPLANT
DECANTER SPIKE VIAL GLASS SM (MISCELLANEOUS) IMPLANT
DERMABOND ADVANCED (GAUZE/BANDAGES/DRESSINGS) ×2
DERMABOND ADVANCED .7 DNX12 (GAUZE/BANDAGES/DRESSINGS) ×2 IMPLANT
DRAPE EXTREMITY T 121X128X90 (DRAPE) ×4 IMPLANT
DRAPE INCISE IOBAN 66X45 STRL (DRAPES) IMPLANT
DRAPE PROXIMA HALF (DRAPES) ×8 IMPLANT
DURAPREP 26ML APPLICATOR (WOUND CARE) ×4 IMPLANT
GAUZE SPONGE 4X4 12PLY STRL (GAUZE/BANDAGES/DRESSINGS) IMPLANT
GAUZE SPONGE 4X4 16PLY XRAY LF (GAUZE/BANDAGES/DRESSINGS) IMPLANT
GLOVE BIO SURGEON STRL SZ7 (GLOVE) ×8 IMPLANT
GLOVE BIOGEL PI IND STRL 7.5 (GLOVE) ×6 IMPLANT
GLOVE BIOGEL PI IND STRL 8.5 (GLOVE) ×4 IMPLANT
GLOVE BIOGEL PI INDICATOR 7.5 (GLOVE) ×6
GLOVE BIOGEL PI INDICATOR 8.5 (GLOVE) ×4
GLOVE ECLIPSE 7.0 STRL STRAW (GLOVE) ×8 IMPLANT
GLOVE ECLIPSE 8.5 STRL (GLOVE) ×8 IMPLANT
GOWN STRL REUS W/ TWL LRG LVL3 (GOWN DISPOSABLE) ×2 IMPLANT
GOWN STRL REUS W/ TWL XL LVL3 (GOWN DISPOSABLE) ×4 IMPLANT
GOWN STRL REUS W/TWL 2XL LVL3 (GOWN DISPOSABLE) ×4 IMPLANT
GOWN STRL REUS W/TWL LRG LVL3 (GOWN DISPOSABLE) ×2
GOWN STRL REUS W/TWL XL LVL3 (GOWN DISPOSABLE) ×4
KIT BASIN OR (CUSTOM PROCEDURE TRAY) ×4 IMPLANT
KIT ROOM TURNOVER OR (KITS) ×4 IMPLANT
LOCATOR NERVE 3 VOLT (DISPOSABLE) IMPLANT
MARKER SKIN DUAL TIP RULER LAB (MISCELLANEOUS) IMPLANT
NEEDLE HYPO 22GX1.5 SAFETY (NEEDLE) IMPLANT
NEEDLE HYPO 25X1 1.5 SAFETY (NEEDLE) ×4 IMPLANT
NS IRRIG 1000ML POUR BTL (IV SOLUTION) IMPLANT
PACK LAMINECTOMY NEURO (CUSTOM PROCEDURE TRAY) ×4 IMPLANT
PACK SURGICAL SETUP 50X90 (CUSTOM PROCEDURE TRAY) IMPLANT
PAD ARMBOARD 7.5X6 YLW CONV (MISCELLANEOUS) ×8 IMPLANT
STOCKINETTE 4X48 STRL (DRAPES) ×4 IMPLANT
SUT ETHILON 3 0 PS 1 (SUTURE) ×4 IMPLANT
SUT ETHILON 4 0 PS 2 18 (SUTURE) ×4 IMPLANT
SUT VIC AB 3-0 SH 8-18 (SUTURE) ×4 IMPLANT
SYR BULB 3OZ (MISCELLANEOUS) IMPLANT
SYR CONTROL 10ML LL (SYRINGE) IMPLANT
TOWEL OR 17X24 6PK STRL BLUE (TOWEL DISPOSABLE) ×8 IMPLANT
TUBE CONNECTING 12'X1/4 (SUCTIONS)
TUBE CONNECTING 12X1/4 (SUCTIONS) IMPLANT
UNDERPAD 30X30 INCONTINENT (UNDERPADS AND DIAPERS) ×4 IMPLANT
WATER STERILE IRR 1000ML POUR (IV SOLUTION) IMPLANT

## 2015-07-19 NOTE — Anesthesia Procedure Notes (Signed)
Procedure Name: LMA Insertion Date/Time: 07/19/2015 6:52 PM Performed by: Trixie Deis A Pre-anesthesia Checklist: Timeout performed, Patient identified, Emergency Drugs available, Suction available and Patient being monitored Patient Re-evaluated:Patient Re-evaluated prior to inductionOxygen Delivery Method: Circle system utilized Preoxygenation: Pre-oxygenation with 100% oxygen Intubation Type: IV induction LMA: LMA inserted LMA Size: 5.0 Number of attempts: 1 Placement Confirmation: positive ETCO2 Tube secured with: Tape Dental Injury: Teeth and Oropharynx as per pre-operative assessment

## 2015-07-19 NOTE — Op Note (Signed)
Date of surgery: 07/19/2015 Preoperative diagnosis: Right ulnar neuropathy at elbow, right carpal tunnel syndrome Postoperative diagnosis: Right ulnar neuropathy at elbow, right carpal tunnel syndrome Procedure: Right ulnar nerve decompression at elbow, right carpal tunnel release Surgeon: Kristeen Miss First assistant: Ashley Jacobs M.D. Anesthesia: Gen. endotracheal scale Indications: Arthur Mata is a 60 year old individual who's had significant cervical spondylitic disease in addition to right cervical radicular findings he also has ulnar neuropathy and carpal tunnel syndrome on the right hand is been having persistent and severe pain in the right upper extremity. He's been advised regarding surgery to decompress press both his carpal tunnel in his ulnar nerve at the elbow.  Procedure: Patient was brought to the operating room supine on a stretcher. After the smooth induction of general endotracheal anesthesia is placed on the operating table with the arm board at the right side. Right arm was prepped and draped with alcohol and DuraPrep using a stocking that technique. Then the stockinette was opened over the region of the wrist and the elbow. After proper identification and timeout procedure 10 mL of a combination of half percent Marcaine and 1% lidocaine with epinephrine was injected into the region of the wrist in the region of the elbow the area of the elbow was marked with the medial epicondyles being marked an area just ventral to this in line with the elbow being outstretched was also chosen as the surgical site. Procedure was started first by decompressing the carpal tunnel and a linear incision was made from the wrist crease in line towards the fourth ray. Subcutaneous anus tissues were dissected. Transcarpal ligament was identified this was sectioned with a 15 blade. The proximal aspect the the ligament appeared to have a normal contour however distally into the palmar arch ligament was  noted to be thickened. This area was opened and the median nerve was released nicely. Hemostasis was checked and then the wound was closed with 3-0 nylon using vertical mattress sutures in the skin.  Attention was then turned to the ulnar nerve. Incision was made in line chosen just ventral to the medial epicondyle. Subcutaneous tissue was dissected and then the area of the muscular septum on the proximal aspect of the nerve was identified in this region the nerve was identified. The nerve was then traced distally as it went around the medial epicondyles and here there is noted to be a thickened fascial band that secured the nerve tightly. This was released in a piecemeal fashion using Metzenbaum scissors and immediately the nerve came into more relaxed state. The nerve was then dissected distally as it entered the flexor carpi ulnaris and over the dorsum of the flexor carpi ulnaris are as noted to be a thickened fascial band this was also released and lap for good decompression of the ulnar nerve. In the in the ulnar nerve was noted to be free and clear in its travel around the outside of the elbow and it did not require transposition. The subcutaneous tissue was then loosely reapproximated with 3-0 Vicryl and 3-0 Vicryl used in the subcutaneous tissues to close the subcuticular skin. Blood loss for the procedure was estimated at 10 mL. Patient tolerated the procedure well the ulnar wound was dressed with Dermabond and wrapped with Kerlix and an Ace wrap and the hand was dressed with ointment and fluffy gauze and also wrapped with Kerlix and an Ace wrap.

## 2015-07-19 NOTE — Anesthesia Postprocedure Evaluation (Signed)
Anesthesia Post Note  Patient: Arthur Mata  Procedure(s) Performed: Procedure(s) (LRB): Right Carpal Tunnel Release (Right) Right Ulnar nerve release  (Right)  Patient location during evaluation: PACU Anesthesia Type: General Level of consciousness: awake, awake and alert and oriented Pain management: pain level controlled Respiratory status: spontaneous breathing, nonlabored ventilation and respiratory function stable Cardiovascular status: blood pressure returned to baseline Anesthetic complications: no    Last Vitals:  Filed Vitals:   07/19/15 1527 07/19/15 2020  BP: 134/81 131/63  Pulse: 65 71  Temp: 37 C 36.3 C  Resp: 18 24    Last Pain:  Filed Vitals:   07/19/15 2024  PainSc: 10-Worst pain ever                 Palak Tercero COKER

## 2015-07-19 NOTE — Discharge Summary (Signed)
Physician Discharge Summary  Patient ID: Arthur Mata MRN: [id number] DOB/AGE: 1955-05-15 60 y.o.  Admit date: 07/19/2015 Discharge date: 07/19/2015  Admission Diagnoses:Right carpal tunnel syndrome right ulnar neuropathy at the elbow, diabetes mellitus  Discharge Diagnoses: Right carpal tunnel syndrome, right ulnar neuropathy at the elbow, diabetes mellitus Active Problems:   Ulnar neuropathy at elbow of right upper extremity   Discharged Condition: good  Hospital Course: Patient was admitted to undergo surgery and he tolerated this well  Consults: None  Significant Diagnostic Studies: None  Treatments: surgery: Right carpal tunnel release, right ulnar and nerve release at the elbow  Discharge Exam: Blood pressure 131/63, pulse 66, temperature 97.3 F (36.3 C), temperature source Oral, resp. rate 16, weight 107.956 kg (238 lb), SpO2 94 %. Incision is clean and dry, motor function is good in the distal upper extremity  Disposition: 01-Home or Self Care  Discharge Instructions    Call MD for:  redness, tenderness, or signs of infection (pain, swelling, redness, odor or green/yellow discharge around incision site)    Complete by:  As directed      Call MD for:  severe uncontrolled pain    Complete by:  As directed      Call MD for:  temperature >100.4    Complete by:  As directed      Diet - low sodium heart healthy    Complete by:  As directed      Increase activity slowly    Complete by:  As directed             Medication List    TAKE these medications        alprazolam 2 MG tablet  Commonly known as:  XANAX  Take 2 mg by mouth 3 (three) times daily as needed for anxiety.     aspirin 81 MG tablet  Take 81 mg by mouth every evening.     atorvastatin 40 MG tablet  Commonly known as:  LIPITOR  Take 40 mg by mouth every evening.     BAYER CONTOUR NEXT TEST test strip  Generic drug:  glucose blood  USE TO CHECK BLOOD SUGAR 5 TIMES PER DAY     buPROPion 150 MG  12 hr tablet  Commonly known as:  WELLBUTRIN SR  Take 150 mg by mouth 2 (two) times daily.     diazepam 5 MG tablet  Commonly known as:  VALIUM  Take 1 tablet (5 mg total) by mouth every 6 (six) hours as needed for muscle spasms.     digoxin 0.25 MG tablet  Commonly known as:  LANOXIN  Take 1 tablet (0.25 mg total) by mouth daily.     gabapentin 600 MG tablet  Commonly known as:  NEURONTIN  Take 600 mg by mouth at bedtime.     Insulin Lispro 200 UNIT/ML Sopn  Commonly known as:  HUMALOG KWIKPEN  Inject 130 Units into the skin 3 (three) times daily. Inject 50 units at breakfast, 30 units at lunch and 50 units at dinner     Insulin Pen Needle 32G X 4 MM Misc  Commonly known as:  BD PEN NEEDLE NANO U/F  Use to inject insulin 4 times daily as instructed.     Liraglutide 18 MG/3ML Sopn  Commonly known as:  VICTOZA  Inject 1.8 mg daily     lisinopril 5 MG tablet  Commonly known as:  PRINIVIL,ZESTRIL  Take 5 mg by mouth every evening.     methocarbamol  500 MG tablet  Commonly known as:  ROBAXIN  Take 500 mg by mouth every 6 (six) hours as needed (For pain.).     metoprolol succinate 25 MG 24 hr tablet  Commonly known as:  TOPROL-XL  Take 25 mg by mouth at bedtime.     nitroGLYCERIN 0.4 MG SL tablet  Commonly known as:  NITROSTAT  Place 1 tablet (0.4 mg total) under the tongue every 5 (five) minutes as needed for chest pain.     oxyCODONE-acetaminophen 10-325 MG tablet  Commonly known as:  PERCOCET  Take 1 tablet by mouth every 4 (four) hours as needed for pain.     oxyCODONE-acetaminophen 10-325 MG tablet  Commonly known as:  PERCOCET  Take 1 tablet by mouth every 4 (four) hours as needed for pain.     pioglitazone 30 MG tablet  Commonly known as:  ACTOS  TAKE 1 TABLET BY MOUTH DAILY     TOUJEO SOLOSTAR 300 UNIT/ML Sopn  Generic drug:  Insulin Glargine  INJECT 120 UNITS INTO THE SKIN DAILY BEFORE BREAKFAST.         SignedEarleen Newport 07/19/2015, 8:53  PM
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
## 2015-07-19 NOTE — Anesthesia Preprocedure Evaluation (Addendum)
Anesthesia Evaluation  Patient identified by MRN, date of birth, ID band Patient awake    Reviewed: Allergy & Precautions, NPO status , Patient's Chart, lab work & pertinent test results  Airway Mallampati: II  TM Distance: >3 FB Neck ROM: Full    Dental no notable dental hx.    Pulmonary COPD, former smoker,    Pulmonary exam normal breath sounds clear to auscultation       Cardiovascular hypertension, Normal cardiovascular exam Rhythm:Regular Rate:Normal     Neuro/Psych negative neurological ROS  negative psych ROS   GI/Hepatic negative GI ROS, Neg liver ROS,   Endo/Other  negative endocrine ROSdiabetes  Renal/GU negative Renal ROS  negative genitourinary   Musculoskeletal negative musculoskeletal ROS (+)   Abdominal   Peds negative pediatric ROS (+)  Hematology negative hematology ROS (+)   Anesthesia Other Findings   Reproductive/Obstetrics negative OB ROS                           Anesthesia Physical Anesthesia Plan  ASA: III  Anesthesia Plan: General   Post-op Pain Management:    Induction: Intravenous  Airway Management Planned: LMA  Additional Equipment:   Intra-op Plan:   Post-operative Plan: Extubation in OR  Informed Consent: I have reviewed the patients History and Physical, chart, labs and discussed the procedure including the risks, benefits and alternatives for the proposed anesthesia with the patient or authorized representative who has indicated his/her understanding and acceptance.   Dental advisory given  Plan Discussed with: CRNA and Surgeon  Anesthesia Plan Comments:         Anesthesia Quick Evaluation

## 2015-07-19 NOTE — Transfer of Care (Signed)
Immediate Anesthesia Transfer of Care Note  Patient: Arthur Mata  Procedure(s) Performed: Procedure(s) with comments: Right Carpal Tunnel Release (Right) - Right Carpal tunnel release Right Ulnar nerve release  (Right) - Right Ulnar nerve release   Patient Location: PACU  Anesthesia Type:General  Level of Consciousness: awake, alert  and oriented  Airway & Oxygen Therapy: Patient Spontanous Breathing and Patient connected to nasal cannula oxygen  Post-op Assessment: Report given to RN, Post -op Vital signs reviewed and stable and Patient moving all extremities  Post vital signs: Reviewed and stable  Last Vitals:  Filed Vitals:   07/19/15 1527 07/19/15 2020  BP: 134/81 131/63  Pulse: 65 71  Temp: 37 C 36.3 C  Resp: 18 24    Last Pain:  Filed Vitals:   07/19/15 2024  PainSc: 10-Worst pain ever      Patients Stated Pain Goal: 4 ([id number])  Complications: No apparent anesthesia complications

## 2015-07-19 NOTE — Progress Notes (Signed)
Patient arrived to unit alert and oriented x 4 . Patient states aching pain in right arm 8/10. Patient oriented to unit call light and telephone placed within patients reach. All medications and orders reviewed with patient. Wife at bedside. RN will continue to monitor patient.

## 2015-07-20 DIAGNOSIS — E119 Type 2 diabetes mellitus without complications: Secondary | ICD-10-CM | POA: Diagnosis not present

## 2015-07-20 DIAGNOSIS — G5601 Carpal tunnel syndrome, right upper limb: Secondary | ICD-10-CM | POA: Diagnosis not present

## 2015-07-20 DIAGNOSIS — I2510 Atherosclerotic heart disease of native coronary artery without angina pectoris: Secondary | ICD-10-CM | POA: Diagnosis not present

## 2015-07-20 DIAGNOSIS — Z87891 Personal history of nicotine dependence: Secondary | ICD-10-CM | POA: Diagnosis not present

## 2015-07-20 DIAGNOSIS — E785 Hyperlipidemia, unspecified: Secondary | ICD-10-CM | POA: Diagnosis not present

## 2015-07-20 DIAGNOSIS — I10 Essential (primary) hypertension: Secondary | ICD-10-CM | POA: Diagnosis not present

## 2015-07-20 DIAGNOSIS — J449 Chronic obstructive pulmonary disease, unspecified: Secondary | ICD-10-CM | POA: Diagnosis not present

## 2015-07-20 DIAGNOSIS — G5621 Lesion of ulnar nerve, right upper limb: Secondary | ICD-10-CM | POA: Diagnosis not present

## 2015-07-20 LAB — GLUCOSE, CAPILLARY: Glucose-Capillary: 219 mg/dL — ABNORMAL HIGH (ref 65–99)

## 2015-07-20 NOTE — Progress Notes (Signed)
Charge RN and on call MD notified of patient and patient wife request to speak with them.

## 2015-07-20 NOTE — Progress Notes (Signed)
Patient administered 5 mg percocet PO, 5 mg percocet and  5 mg Vallium PO.

## 2015-07-20 NOTE — Progress Notes (Signed)
OT Cancellation Note  Patient Details Name: Arthur Mata MRN: [id number] DOB: 08-Jan-1956   Cancelled Treatment:    Reason Eval/Treat Not Completed: OT screened, no needs identified, will sign off. Pt verbalized and demonstrated understanding of edema management strategies and cervical precautions from recent cervical surgery. Pt and wife report that they have all necessary DME and wife able to provide all necessary assistance as needed.  Redmond Baseman, OTR/L Pager: [phone] 07/20/2015, 9:31 AM

## 2015-07-20 NOTE — Progress Notes (Signed)
Patient refused his IV ancef this morning, states that he is going to [employer] for breakfast and going home, discharge instructions reviewed with patient and wife-No further questions at this time, Staff transported patient to family vehicle.

## 2015-07-22 ENCOUNTER — Encounter (HOSPITAL_COMMUNITY): Payer: Self-pay | Admitting: Neurological Surgery

## 2015-07-25 ENCOUNTER — Encounter (HOSPITAL_COMMUNITY): Payer: Self-pay | Admitting: Neurological Surgery

## 2015-07-31 DIAGNOSIS — Z6832 Body mass index (BMI) 32.0-32.9, adult: Secondary | ICD-10-CM | POA: Diagnosis not present

## 2015-07-31 DIAGNOSIS — M5412 Radiculopathy, cervical region: Secondary | ICD-10-CM | POA: Diagnosis not present

## 2015-07-31 DIAGNOSIS — G5621 Lesion of ulnar nerve, right upper limb: Secondary | ICD-10-CM | POA: Diagnosis not present

## 2015-08-02 ENCOUNTER — Encounter (HOSPITAL_COMMUNITY): Payer: Self-pay | Admitting: Neurological Surgery

## 2015-08-06 MED FILL — BUPROPION HCL SR 150 MG TAB: 150 | 30 days supply | Qty: 60 | Fill #1

## 2015-08-06 MED FILL — DIGOXIN 250 MCG TABLET: 250 | 90 days supply | Qty: 90 | Fill #1

## 2015-08-09 ENCOUNTER — Encounter (HOSPITAL_COMMUNITY): Payer: Self-pay | Admitting: Neurological Surgery

## 2015-08-12 MED FILL — TOUJEO SOLOSTAR 300 UNITS/M: 300 | 25 days supply | Qty: 9 | Fill #3

## 2015-08-14 ENCOUNTER — Encounter: Payer: Self-pay | Admitting: Endocrinology

## 2015-08-14 ENCOUNTER — Ambulatory Visit (INDEPENDENT_AMBULATORY_CARE_PROVIDER_SITE_OTHER): Payer: 59 | Admitting: Endocrinology

## 2015-08-14 VITALS — BP 128/64 | HR 73 | Wt 224.0 lb

## 2015-08-14 DIAGNOSIS — E1165 Type 2 diabetes mellitus with hyperglycemia: Secondary | ICD-10-CM

## 2015-08-14 DIAGNOSIS — Z794 Long term (current) use of insulin: Secondary | ICD-10-CM

## 2015-08-14 NOTE — Patient Instructions (Addendum)
Actos 1 daily  Add 20-30 more units of insulin each time of both types  Toujeo 140 at least, get am sugar <130  80--50--80 Humalog  Check blood sugars on waking up 5-6  times a week Also check blood sugars about 2 hours after a meal and do this after different meals by rotation  Recommended blood sugar levels on waking up is 90-130 and about 2 hours after meal is 130-160  Please bring your blood sugar monitor to each visit, thank you  Medtronic and Omnipod pumps

## 2015-08-14 NOTE — Progress Notes (Signed)
Patient ID: Arthur Mata, male   DOB: 04-19-1955, 60 y.o.   MRN: [id number]           Reason for Appointment:  Follow-up for Type 2 Diabetes  Referring physician: Girtha Rm  History of Present Illness:          Diagnosis: Type 2 diabetes mellitus, date of diagnosis: 12/2011      Past history:  At the time of diagnosis he had symptoms of frequent urination, thirst and blurred vision He thinks his blood sugar was about 600.  His physician started him on Novolog and also Metformin  Details of initial treatment are not available but he also apparently took Victoza at some point before insulin and he does not think it worked After some time he was also given Levemir in addition to his NovoLog His blood sugars were somewhat better controlled after some time and were averaging about 200. He thinks that because of diarrhea his metformin was stopped after a few months.  Apparently in 10/2012 he had epidural steroid and with this his blood sugar went up to 600.   He had continued to have low back pain also has not had  repeat epidural steroid injections However he thinks his blood sugars had been persistently poorly controlled since then. He has been under the care of an endocrinologist in [place]. Actos was started in 06/2012 without much benefit. He was also tried on Invokana for about 3 months and reportedly had no benefit from this. He was started on U-500 instead of Levemir and NovoLog in late 2014 His blood sugars had been poorly controlled with A1c ranging from 10.5-12.5 and recently 11.5 prior to his initial consultation.  Recent history:   INSULIN regimen is described as:  Toujeo 120 units 10 pm., Humalog U-200 before meals = 50-30-50   On his initial consultation he was switched from the U-500 insulin to Toujeo 120 units hs and Humalog U-200 70-40-70 before meals   With this his blood sugars improved considerably and insulin dose was reduced, also A1c came down to 7.2 However  subsequently his A1c has progressively increased has been over 8%, recently 9.3  Because of his tendency to weight gain and inadequate control he was started on Victoza in 5/17 and told to stop Actos However his blood sugars are markedly increased, he did not report these high readings     Current blood sugar patterns, daily management and problems identified:  He has been compliant with Victoza as tolerated and is taking 1.2 mg without side effects  Also has been taking his insulin as directed but not increasing the dose except at lunchtime even with high readings over 400  He has had increased thirst and lost weight with his higher sugars recently  Blood sugars are the highest after meals either around lunchtime or late evening; he has finally started checking his blood sugars more consistently at different times  He has had various surgeries and has not been active  Has not changed his diet, drinking a lot of water  Oral hypoglycemic drugs the patient is taking are:   Actos 30 mg      Side effects from medications have been: Diarrhea from metformin Compliance with the medical regimen: Good   Glucose monitoring:  done 1-2 times a day         Glucometer: Contour next      Blood Glucose readings by monitor download::  Mean values apply above for all meters except median for  One Touch  PRE-MEAL Fasting Lunch Dinner Bedtime Overall  Glucose range: 194-374  284-532   272-482    Mean/median: 294     349     Self-care:   He has seen the dietitian and has been trying to usually get low carbohydrate low fat meals.  Meals: 3 meals per day. Breakfast is usually cereal now, lunch is usually crackers, evening meal usually chicken or small amount of beef or deer meat and vegetables.  Snacks: Popcorn, orange or apple           Exercise:  minimal recently Dietician visit, most recent: 1/16               Weight history: Previously 202-236  Wt Readings from Last 3 Encounters:  08/14/15 224  lb (101.606 kg)  07/19/15 229 lb 11.5 oz (104.2 kg)  07/12/15 238 lb (107.956 kg)   Glycemic control:   Lab Results  Component Value Date   HGBA1C 9.3 07/04/2015   HGBA1C 8.7* 04/11/2015   HGBA1C 8.4 04/04/2015   Lab Results  Component Value Date   MICROALBUR 2.6* 04/04/2015   LDLCALC 91 04/04/2015   CREATININE 1.05 07/09/2015         Medication List       This list is accurate as of: 08/14/15  9:50 AM.  Always use your most recent med list.               alprazolam 2 MG tablet  Commonly known as:  XANAX  Take 2 mg by mouth 3 (three) times daily as needed for anxiety.     aspirin 81 MG tablet  Take 81 mg by mouth every evening.     atorvastatin 40 MG tablet  Commonly known as:  LIPITOR  Take 40 mg by mouth every evening.     BAYER CONTOUR NEXT TEST test strip  Generic drug:  glucose blood  USE TO CHECK BLOOD SUGAR 5 TIMES PER DAY     buPROPion 150 MG 12 hr tablet  Commonly known as:  WELLBUTRIN SR  Take 150 mg by mouth 2 (two) times daily.     diazepam 5 MG tablet  Commonly known as:  VALIUM  Take 1 tablet (5 mg total) by mouth every 6 (six) hours as needed for muscle spasms.     digoxin 0.25 MG tablet  Commonly known as:  LANOXIN  Take 1 tablet (0.25 mg total) by mouth daily.     gabapentin 600 MG tablet  Commonly known as:  NEURONTIN  Take 600 mg by mouth at bedtime.     Insulin Lispro 200 UNIT/ML Sopn  Commonly known as:  HUMALOG KWIKPEN  Inject 130 Units into the skin 3 (three) times daily. Inject 50 units at breakfast, 30 units at lunch and 50 units at dinner     Insulin Pen Needle 32G X 4 MM Misc  Commonly known as:  BD PEN NEEDLE NANO U/F  Use to inject insulin 4 times daily as instructed.     Liraglutide 18 MG/3ML Sopn  Commonly known as:  VICTOZA  Inject 1.8 mg daily     lisinopril 5 MG tablet  Commonly known as:  PRINIVIL,ZESTRIL  Take 5 mg by mouth every evening.     methocarbamol 500 MG tablet  Commonly known as:  ROBAXIN    Take 500 mg by mouth every 6 (six) hours as needed (For pain.).     metoprolol succinate 25 MG 24 hr tablet  Commonly  known as:  TOPROL-XL  Take 25 mg by mouth at bedtime.     nitroGLYCERIN 0.4 MG SL tablet  Commonly known as:  NITROSTAT  Place 1 tablet (0.4 mg total) under the tongue every 5 (five) minutes as needed for chest pain.     oxyCODONE-acetaminophen 10-325 MG tablet  Commonly known as:  PERCOCET  Take 1 tablet by mouth every 4 (four) hours as needed for pain.     oxyCODONE-acetaminophen 10-325 MG tablet  Commonly known as:  PERCOCET  Take 1 tablet by mouth every 4 (four) hours as needed for pain.     pioglitazone 30 MG tablet  Commonly known as:  ACTOS  TAKE 1 TABLET BY MOUTH DAILY     TOUJEO SOLOSTAR 300 UNIT/ML Sopn  Generic drug:  Insulin Glargine  INJECT 120 UNITS INTO THE SKIN DAILY BEFORE BREAKFAST.        Allergies:  Allergies  Allergen Reactions  . Prednisone Other (See Comments)    Run sugar over 600 causes hospitalization    Past Medical History  Diagnosis Date  . Diabetes mellitus without complication ([place])   . Angina pectoris ([place])   . Palpitations   . Chest pain   . Hyperlipidemia   . Hypertension   . Near syncope   . Bone spur of left foot   . Obesity   . Mumps   . Measles   . Coronary artery disease   . COPD (chronic obstructive pulmonary disease) ([place])   . Headache     pt. denies  . History of pneumonia   . History of bronchitis   . Arthritis     Past Surgical History  Procedure Laterality Date  . Spine surgery  03/21/13    Lumbar fusion  . Anterior cervical discectomy    . Anterior cervical decomp/discectomy fusion N/A 04/26/2015    Procedure: Anterior Cervical Discectomy and Fusion - Cervical five-Cervical six with alta interbody;  Surgeon: Kristeen Miss, MD;  Location: [facility];  Service: Neurosurgery;  Laterality: N/A;  . Foot surgery Left   . Eye surgery Bilateral     cataracts  . Cardiac catheterization    .  Carpal tunnel release Right 07/19/2015    Procedure: Right Carpal Tunnel Release;  Surgeon: Kristeen Miss, MD;  Location: [facility];  Service: Neurosurgery;  Laterality: Right;  Right Carpal tunnel release  . Ulnar nerve transposition Right 07/19/2015    Procedure: Right Ulnar nerve release ;  Surgeon: Kristeen Miss, MD;  Location: [facility];  Service: Neurosurgery;  Laterality: Right;  Right Ulnar nerve release   . Posterior cervical fusion/foraminotomy N/A 07/12/2015    Procedure: Cervical five-six Cervical seven-Thoracic one Posterior cervical fusion with instrumention;  Surgeon: Kristeen Miss, MD;  Location: [facility];  Service: Neurosurgery;  Laterality: N/A;  C5-6 C7-T1 Posterior cervical fusion with lateral mass fixation    Family History  Problem Relation Age of Onset  . Diabetes Mother   . Diabetes Father   . Heart disease Father   . Diabetes Brother   . Hyperlipidemia Other   . Hypertension Other     Social History:  reports that he quit smoking about 4 months ago. His smoking use included Cigarettes. He started smoking about 17 months ago. He has a 6.25 pack-year smoking history. He has never used smokeless tobacco. He reports that he drinks alcohol. He reports that he does not use illicit drugs.    Review of Systems  Most recent eye exam was in 1/16       Lipids: In 12/15 showed LDL 69, HDL 24 and triglycerides 168.  Has been on Lipitor for quite some time       Lab Results  Component Value Date   CHOL 143 04/04/2015   HDL 28.90* 04/04/2015   LDLCALC 91 04/04/2015   TRIG 119.0 04/04/2015   CHOLHDL 5 04/04/2015                       He has a history of left leg  Numbness, tingling and burning and also some in his left foot.  Taking gabapentin high doses for this.  Foot exam in 12/15 showed the following: significantly decreased monofilament sensation in the toes especially left fifth toe, no skin lesions or ulcers on the feet and normal  pulses  LABS:    Physical Examination:  BP 128/64 mmHg  Pulse 73  Wt 224 lb (101.606 kg)  SpO2 96%       ASSESSMENT:  Diabetes type 2, uncontrolled  See history of present illness for detailed description of his daily regimen, problems identified and blood sugar patterns  His blood sugars are markedly increased since his last visit and the only change has been stopping Actos Blood sugars are averaging over 300 even though there were much better on the last visit Although he has lost weight this is likely to be from his hyperglycemia as well as stopping Actos He has tolerated Victoza but this does not appear to be helping his glucose control in combination with his insulin He had been offered information on insulin pump but he is still not ready to started unless it is within his financial reach  PLAN:   He will go back to Actos 30 mg daily which she has at home, may reduce to 15 mg once blood sugars are controlled  Continue Victoza for now  Increase Toujeo by at least 20 units and mealtime insulin by 30 units for now  Call if blood sugar not improved  He will go back to the original doses of insulin when blood sugars are well controlled  Given basic information on insulin pump usage and he will come back with his wife to discuss options with nurse educator.  He can also look up information on Medtronic and Omnipod pumps online  Patient Instructions  Actos 1 daily  Add 20-30 more units of insulin each time of both types  Toujeo 140 at least, get am sugar <130  80--50--80 Humalog  Check blood sugars on waking up 5-6  times a week Also check blood sugars about 2 hours after a meal and do this after different meals by rotation  Recommended blood sugar levels on waking up is 90-130 and about 2 hours after meal is 130-160  Please bring your blood sugar monitor to each visit, thank you  Medtronic and Omnipod pumps    Counseling time on subjects discussed above is  over 50% of today's 25 minute visit   Porsche Noguchi 08/14/2015, 9:50 AM   Note: This office note was prepared with Estate agent. Any transcriptional errors that result from this process are unintentional.

## 2015-08-21 MED FILL — BD PEN NDL NANO 32GX5/32: 32G X 4 MM | 25 days supply | Qty: 100 | Fill #3

## 2015-08-22 DIAGNOSIS — F17201 Nicotine dependence, unspecified, in remission: Secondary | ICD-10-CM | POA: Diagnosis not present

## 2015-08-22 DIAGNOSIS — E1140 Type 2 diabetes mellitus with diabetic neuropathy, unspecified: Secondary | ICD-10-CM | POA: Diagnosis not present

## 2015-08-22 DIAGNOSIS — F419 Anxiety disorder, unspecified: Secondary | ICD-10-CM | POA: Diagnosis not present

## 2015-09-03 MED FILL — TOUJEO SOLOSTAR 300 UNITS/M: 300 | 25 days supply | Qty: 9 | Fill #4

## 2015-09-04 MED FILL — LISINOPRIL 5 MG TABLET: 5 | 90 days supply | Qty: 90 | Fill #2

## 2015-09-04 MED FILL — ATORVASTATIN 40 MG TABLET: 40 | 90 days supply | Qty: 90 | Fill #2

## 2015-09-10 ENCOUNTER — Other Ambulatory Visit: Payer: Self-pay | Admitting: Endocrinology

## 2015-09-10 ENCOUNTER — Encounter: Payer: Self-pay | Admitting: Pharmacist

## 2015-09-10 ENCOUNTER — Other Ambulatory Visit: Payer: Self-pay | Admitting: Pharmacist

## 2015-09-10 MED FILL — CONTOUR NEXT STRIPS: 30 days supply | Qty: 150 | Fill #0

## 2015-09-10 NOTE — Patient Outreach (Signed)
[facility]) Care Management  09/10/2015  CHARLS GROWNEY 1955-12-11 [id number]   Subjective:  Patient presents today for 3 month diabetes follow-up as part of the employer-sponsored [employer] to Wellness program.  Current diabetes regimen includes Toujeo 140U, Humalog 80-50-80 Units.  Patient also continues on daily ASA, ACE Inhibitor and statin.  No med changes or major health changes at this time.    Assessment:  Diabetes: Most recent A1C was 9.3% which is exceeding goal of less than 7% and the highest A1c of past several years. Weight is decreased 5lbs from last visit with me.   Lifestyle improvements:  Physical Activity- Regular physical activity, typically from working on farm/in garage.  Nutrition- Same as previous. Mostly controlled with some bouts of "getting stuck somewhere without any good options"  Follow up with me in 3 months.    Plan/Goals for Next Visit:  1. Continue to monitor blood glucoses as advised by Dr. Dwyane Dee. Please report if the high readings continue.  2. Revisit conversation with Dr. Dwyane Dee about pump initiation.    Next appointment to see me is: 12/10/15 @ 10AM

## 2015-09-11 ENCOUNTER — Other Ambulatory Visit (HOSPITAL_COMMUNITY): Payer: Self-pay | Admitting: Neurological Surgery

## 2015-09-11 ENCOUNTER — Other Ambulatory Visit: Payer: Self-pay | Admitting: Neurological Surgery

## 2015-09-11 DIAGNOSIS — M5412 Radiculopathy, cervical region: Secondary | ICD-10-CM

## 2015-09-12 ENCOUNTER — Encounter: Payer: Self-pay | Admitting: Endocrinology

## 2015-09-12 ENCOUNTER — Ambulatory Visit (INDEPENDENT_AMBULATORY_CARE_PROVIDER_SITE_OTHER): Payer: 59 | Admitting: Endocrinology

## 2015-09-12 VITALS — BP 108/60 | HR 56 | Ht 70.0 in | Wt 226.0 lb

## 2015-09-12 DIAGNOSIS — E1165 Type 2 diabetes mellitus with hyperglycemia: Secondary | ICD-10-CM

## 2015-09-12 DIAGNOSIS — Z794 Long term (current) use of insulin: Secondary | ICD-10-CM

## 2015-09-12 MED ORDER — CANAGLIFLOZIN 300 MG PO TABS: 300.0000 mg | ORAL_TABLET | Freq: Every day | ORAL | Status: DC

## 2015-09-12 MED FILL — INVOKANA 300 MG TABLET: 300 | 30 days supply | Qty: 30 | Fill #0

## 2015-09-12 NOTE — Progress Notes (Signed)
Patient ID: Arthur Mata, male   DOB: 1956-02-10, 60 y.o.   MRN: [id number]           Reason for Appointment:  Follow-up for Type 2 Diabetes  Referring physician: Girtha Rm  History of Present Illness:          Diagnosis: Type 2 diabetes mellitus, date of diagnosis: 12/2011      Past history:  At the time of diagnosis he had symptoms of frequent urination, thirst and blurred vision He thinks his blood sugar was about 600.  His physician started him on Novolog and also Metformin  Details of initial treatment are not available but he also apparently took Victoza at some point before insulin and he does not think it worked After some time he was also given Levemir in addition to his NovoLog His blood sugars were somewhat better controlled after some time and were averaging about 200. He thinks that because of diarrhea his metformin was stopped after a few months.  Apparently in 10/2012 he had epidural steroid and with this his blood sugar went up to 600.   He had continued to have low back pain also has not had  repeat epidural steroid injections However he thinks his blood sugars had been persistently poorly controlled since then. He has been under the care of an endocrinologist in [place]. Actos was started in 06/2012 without much benefit. He was also tried on Invokana for about 3 months and reportedly had no benefit from this. He was started on U-500 instead of Levemir and NovoLog in late 2014 His blood sugars had been poorly controlled with A1c ranging from 10.5-12.5 and recently 11.5 prior to his initial consultation.  Recent history:   INSULIN regimen is described as:  Toujeo 140 units 10 pm., Humalog U-200 before meals = 80-50-80   On his initial consultation he was switched from the U-500 insulin to Toujeo 120 units hs and Humalog U-200 70-40-70 before meals   With this his blood sugars improved considerably and insulin dose was reduced, also A1c came down to 7.2 However  subsequently his A1c has progressively increased has been over 8%, recently 9.3  Because of his tendency to weight gain and inadequate control he was started on Victoza in 5/17 and told to stop Actos However his blood sugars are significantly increased in 6/17 and Actos was restarted     Current blood sugar patterns, daily management and problems identified:  He has been increasing his insulin as directed along with starting Victoza  His blood sugars have overall improved but mostly in the mornings  He is generally eating oatmeal at breakfast but blood sugars at lunchtime are variable  He is trying to be a little more active  Symptomatically feels a little better with improving blood sugars  Oral hypoglycemic drugs the patient is taking are:   Actos 30 mg      Side effects from medications have been: Diarrhea from metformin Compliance with the medical regimen: Good   Glucose monitoring:  done 1-2 times a day         Glucometer: Contour next      Blood Glucose readings by monitor download::  Mean values apply above for all meters except median for One Touch  PRE-MEAL Fasting Midday  Dinner Bedtime Overall  Glucose range: 101-388  174-494   139-472    Mean/median: 186     240      Self-care:   He has seen the dietitian and has  been trying to usually get low carbohydrate low fat meals.  Meals: 3 meals per day. Breakfast is usually cereal now, lunch is usually crackers, evening meal usually chicken or small amount of beef or deer meat and vegetables.  Snacks: Popcorn, orange or apple            Exercise:  minimal recently Dietician visit, most recent: 1/16               Weight history: Previously 202-236  Wt Readings from Last 3 Encounters:  09/12/15 226 lb (102.513 kg)  09/10/15 229 lb 9.6 oz (104.146 kg)  08/14/15 224 lb (101.606 kg)   Glycemic control:   Lab Results  Component Value Date   HGBA1C 9.3 07/04/2015   HGBA1C 8.7* 04/11/2015   HGBA1C 8.4 04/04/2015   Lab  Results  Component Value Date   MICROALBUR 2.6* 04/04/2015   LDLCALC 91 04/04/2015   CREATININE 1.05 07/09/2015         Medication List       This list is accurate as of: 09/12/15  9:49 AM.  Always use your most recent med list.               alprazolam 2 MG tablet  Commonly known as:  XANAX  Take 2 mg by mouth 3 (three) times daily as needed for anxiety.     aspirin 81 MG tablet  Take 81 mg by mouth every evening.     atorvastatin 40 MG tablet  Commonly known as:  LIPITOR  Take 40 mg by mouth every evening.     BAYER CONTOUR NEXT TEST test strip  Generic drug:  glucose blood  USE TO CHECK BLOOD SUGAR 5 TIMES PER DAY     buPROPion 150 MG 12 hr tablet  Commonly known as:  WELLBUTRIN SR  Take 150 mg by mouth 2 (two) times daily.     canagliflozin 300 MG Tabs tablet  Commonly known as:  INVOKANA  Take 1 tablet (300 mg total) by mouth daily before breakfast.     diazepam 5 MG tablet  Commonly known as:  VALIUM  Take 1 tablet (5 mg total) by mouth every 6 (six) hours as needed for muscle spasms.     digoxin 0.25 MG tablet  Commonly known as:  LANOXIN  Take 1 tablet (0.25 mg total) by mouth daily.     gabapentin 600 MG tablet  Commonly known as:  NEURONTIN  Take 600 mg by mouth at bedtime.     Insulin Lispro 200 UNIT/ML Sopn  Commonly known as:  HUMALOG KWIKPEN  Inject 130 Units into the skin 3 (three) times daily. Inject 50 units at breakfast, 30 units at lunch and 50 units at dinner     Insulin Pen Needle 32G X 4 MM Misc  Commonly known as:  BD PEN NEEDLE NANO U/F  Use to inject insulin 4 times daily as instructed.     lisinopril 5 MG tablet  Commonly known as:  PRINIVIL,ZESTRIL  Take 5 mg by mouth every evening.     methocarbamol 500 MG tablet  Commonly known as:  ROBAXIN  Take 500 mg by mouth every 6 (six) hours as needed (For pain.).     metoprolol succinate 25 MG 24 hr tablet  Commonly known as:  TOPROL-XL  Take 25 mg by mouth at bedtime.      nitroGLYCERIN 0.4 MG SL tablet  Commonly known as:  NITROSTAT  Place 1 tablet (0.4 mg total)  under the tongue every 5 (five) minutes as needed for chest pain.     pioglitazone 30 MG tablet  Commonly known as:  ACTOS  TAKE 1 TABLET BY MOUTH DAILY     TOUJEO SOLOSTAR 300 UNIT/ML Sopn  Generic drug:  Insulin Glargine  INJECT 120 UNITS INTO THE SKIN DAILY BEFORE BREAKFAST.        Allergies:  Allergies  Allergen Reactions  . Prednisone Other (See Comments)    Run sugar over 600 causes hospitalization    Past Medical History  Diagnosis Date  . Diabetes mellitus without complication ([place])   . Angina pectoris ([place])   . Palpitations   . Chest pain   . Hyperlipidemia   . Hypertension   . Near syncope   . Bone spur of left foot   . Obesity   . Mumps   . Measles   . Coronary artery disease   . COPD (chronic obstructive pulmonary disease) ([place])   . Headache     pt. denies  . History of pneumonia   . History of bronchitis   . Arthritis     Past Surgical History  Procedure Laterality Date  . Spine surgery  03/21/13    Lumbar fusion  . Anterior cervical discectomy    . Anterior cervical decomp/discectomy fusion N/A 04/26/2015    Procedure: Anterior Cervical Discectomy and Fusion - Cervical five-Cervical six with alta interbody;  Surgeon: Kristeen Miss, MD;  Location: [facility];  Service: Neurosurgery;  Laterality: N/A;  . Foot surgery Left   . Eye surgery Bilateral     cataracts  . Cardiac catheterization    . Carpal tunnel release Right 07/19/2015    Procedure: Right Carpal Tunnel Release;  Surgeon: Kristeen Miss, MD;  Location: [facility];  Service: Neurosurgery;  Laterality: Right;  Right Carpal tunnel release  . Ulnar nerve transposition Right 07/19/2015    Procedure: Right Ulnar nerve release ;  Surgeon: Kristeen Miss, MD;  Location: [facility];  Service: Neurosurgery;  Laterality: Right;  Right Ulnar nerve release   . Posterior cervical fusion/foraminotomy N/A  07/12/2015    Procedure: Cervical five-six Cervical seven-Thoracic one Posterior cervical fusion with instrumention;  Surgeon: Kristeen Miss, MD;  Location: [facility];  Service: Neurosurgery;  Laterality: N/A;  C5-6 C7-T1 Posterior cervical fusion with lateral mass fixation    Family History  Problem Relation Age of Onset  . Diabetes Mother   . Diabetes Father   . Heart disease Father   . Diabetes Brother   . Hyperlipidemia Other   . Hypertension Other     Social History:  reports that he quit smoking about 5 months ago. His smoking use included Cigarettes. He started smoking about 18 months ago. He has a 6.25 pack-year smoking history. He has never used smokeless tobacco. He reports that he drinks alcohol. He reports that he does not use illicit drugs.    Review of Systems    He may have mild hypertension.  Taking only low-dose lisinopril and metoprolol  Most recent eye exam was in 1/16       Lipids: As below.  Has been on Lipitor for quite some time       Lab Results  Component Value Date   CHOL 143 04/04/2015   HDL 28.90* 04/04/2015   LDLCALC 91 04/04/2015   TRIG 119.0 04/04/2015   CHOLHDL 5 04/04/2015  He has a history of left leg  Numbness, tingling and burning and also some in his left foot.  Taking gabapentin high doses for this.  Foot exam in 12/15 showed the following: significantly decreased monofilament sensation in the toes especially left fifth toe, no skin lesions or ulcers on the feet and normal pulses  LABS:    Physical Examination:  BP 108/60 mmHg  Pulse 56  Ht 5\' 10"  (1.778 m)  Wt 226 lb (102.513 kg)  BMI 32.43 kg/m2  SpO2 96%     No ankle edema present  ASSESSMENT:  Diabetes type 2, uncontrolled  See history of present illness for detailed description of his daily regimen, problems identified and blood sugar patterns  His blood sugars are Gradually improving especially the fasting blood sugars with adding  Actos Blood sugars are Not consistently controlled and has mostly over 200 readings postprandially still Not clear if his Toujeo is working 24 hours, fasting readings appear to be generally better than evening readings His weight has gone up slightly as expected Although he thinks he is watching his diet better he is not able to be as active as he would like because of various issues with cervical disc problems  PLAN:   He will go back to Invokana along with Actos 30 mg daily   Discontinue Victoza for now  Increase Humalog by at least 20 units at suppertime and may consider increasing breakfast and lunch doses  Split Toujeo to twice a day, 75 units each dose and this may help better 24 control  He will switched to [place] on the next prescription using 150 units once a day  Emphasized the need to check more readings after meals and not everyday in the morning  Call if blood sugar not improved  Encouraged him to look again into the insulin pumps as these would be more efficient, most likely he can use U-500 insulin in the pump  Consider reducing Actos if weight gain a problem  Patient Instructions  Humalog 90 at dinner  More sugars after meals  Toujeo 75 units twice daily; then will use 150 Tresiba once daily  Stop Victoza and Lisinopril  Invokana in am daily        Counseling time on subjects discussed above is over 50% of today's 25 minute visit   Zenith Kercheval 09/12/2015, 9:49 AM   Note: This office note was prepared with Estate agent. Any transcriptional errors that result from this process are unintentional.

## 2015-09-12 NOTE — Patient Instructions (Addendum)
Humalog 90 at dinner  More sugars after meals  Toujeo 75 units twice daily; then will use 150 Tresiba once daily  Stop Victoza and Lisinopril  Invokana in am daily

## 2015-09-13 DIAGNOSIS — Z760 Encounter for issue of repeat prescription: Secondary | ICD-10-CM | POA: Diagnosis not present

## 2015-09-18 MED FILL — ALPRAZolam 2 MG TABS: 2 | 30 days supply | Qty: 90 | Fill #0

## 2015-09-20 ENCOUNTER — Ambulatory Visit (INDEPENDENT_AMBULATORY_CARE_PROVIDER_SITE_OTHER): Payer: 59 | Admitting: Physician Assistant

## 2015-09-20 ENCOUNTER — Telehealth: Payer: Self-pay | Admitting: Physician Assistant

## 2015-09-20 ENCOUNTER — Encounter: Payer: Self-pay | Admitting: Physician Assistant

## 2015-09-20 VITALS — BP 112/60 | HR 67 | Ht 71.0 in | Wt 229.0 lb

## 2015-09-20 DIAGNOSIS — I25119 Atherosclerotic heart disease of native coronary artery with unspecified angina pectoris: Secondary | ICD-10-CM | POA: Diagnosis not present

## 2015-09-20 DIAGNOSIS — Z79899 Other long term (current) drug therapy: Secondary | ICD-10-CM | POA: Diagnosis not present

## 2015-09-20 DIAGNOSIS — E669 Obesity, unspecified: Secondary | ICD-10-CM

## 2015-09-20 DIAGNOSIS — E785 Hyperlipidemia, unspecified: Secondary | ICD-10-CM

## 2015-09-20 DIAGNOSIS — R079 Chest pain, unspecified: Secondary | ICD-10-CM

## 2015-09-20 DIAGNOSIS — I10 Essential (primary) hypertension: Secondary | ICD-10-CM

## 2015-09-20 DIAGNOSIS — E119 Type 2 diabetes mellitus without complications: Secondary | ICD-10-CM

## 2015-09-20 DIAGNOSIS — E1169 Type 2 diabetes mellitus with other specified complication: Secondary | ICD-10-CM

## 2015-09-20 LAB — TSH: TSH: 1.23 m[IU]/L (ref 0.40–4.50)

## 2015-09-20 LAB — CBC
HCT: 42.7 % (ref 38.5–50.0)
HEMOGLOBIN: 14.7 g/dL (ref 13.2–17.1)
MCH: 30.5 pg (ref 27.0–33.0)
MCHC: 34.4 g/dL (ref 32.0–36.0)
MCV: 88.6 fL (ref 80.0–100.0)
MPV: 10.8 fL (ref 7.5–12.5)
Platelets: 260 10*3/uL (ref 140–400)
RBC: 4.82 MIL/uL (ref 4.20–5.80)
RDW: 13.2 % (ref 11.0–15.0)
WBC: 10.6 10*3/uL (ref 3.8–10.8)

## 2015-09-20 LAB — DIGOXIN LEVEL: Digoxin Level: 0.8 ug/L (ref 0.8–2.0)

## 2015-09-20 LAB — BASIC METABOLIC PANEL
BUN: 13 mg/dL (ref 7–25)
CALCIUM: 9.7 mg/dL (ref 8.6–10.3)
CO2: 22 mmol/L (ref 20–31)
CREATININE: 0.87 mg/dL (ref 0.70–1.33)
Chloride: 102 mmol/L (ref 98–110)
GLUCOSE: 221 mg/dL — AB (ref 65–99)
Potassium: 5 mmol/L (ref 3.5–5.3)
Sodium: 134 mmol/L — ABNORMAL LOW (ref 135–146)

## 2015-09-20 LAB — TROPONIN I

## 2015-09-20 NOTE — Patient Instructions (Signed)
Medication Instructions:  None  Labwork: BMET, CBC, TSH, Troponin and Digoxin today.  Testing/Procedures: Your physician has requested that you have an echocardiogram. Echocardiography is a painless test that uses sound waves to create images of your heart. It provides your doctor with information about the size and shape of your heart and how well your heart's chambers and valves are working. This procedure takes approximately one hour. There are no restrictions for this procedure.  Your physician has requested that you have en exercise stress myoview. For further information please visit [email or web address]. Please follow instruction sheet, as given.    Follow-Up: Your physician recommends that you schedule a follow-up appointment in: 2 weeks with any general cardiologist that is accepting new patients.  (Former Forensic psychologist patient)   Any Other Special Instructions Will Be Listed Below (If Applicable).     If you need a refill on your cardiac medications before your next appointment, please call your pharmacy.

## 2015-09-20 NOTE — Telephone Encounter (Signed)
Pt aware of his troponin level and he verbalized understanding.

## 2015-09-20 NOTE — Telephone Encounter (Signed)
-----   Message from Rebecca, [place] sent at 09/20/2015  2:03 PM EDT ----- Trop negative, continue with plan for outpatient echo and stress test

## 2015-09-20 NOTE — Progress Notes (Signed)
Cardiology Office Note    Date:  09/20/2015   ID:  Arthur Mata, DOB January 29, 1956, MRN [id number]  PCP:  Cecilie Lowers, MD  Cardiologist:  Previously Dr. Mare Ferrari  Chief Complaint  Patient presents with  . Follow-up    previously Dr. Mare Ferrari patient, will set him up with another cardiologist on next followup as today's DOD is Dr. Lovena Le    History of Present Illness:  Arthur Mata is a 60 y.o. male with PMH of HTN, HLD, DM II, CAD and COPD. He had a cardiac catheterization in 2005 which showed only mild plaque. He had a second cardiac catheterization in 2014 in [facility] which showed nonobstructive CAD and ejection fraction of 65%. He had a history of paroxysmal supraventricular tachycardia and take digoxin 0.25 mg daily. He had a Myoview on 03/13/2014 which showed no ischemia and EF 63%. He has been followed by Dr. Dwyane Dee for type 2 diabetes. He underwent 2 surgeries in May 2017, the first surgery was performed on 07/12/2015 with posterior supplemental fixation and fusion of C5 and C5-6 with allograft and the posterior lateral fusion of the C7 and T1 with allograft and also had decompression of right C8 nerve root via laminectomy and foraminotomy by Dr. Ellene Route. Post recovery, he underwent right ulnar nerve decompression at elbow and the right carpal tunnel release by Dr. Ellene Route on 5/26.  For the past month, patient has been having intermittent burning sensation in the chest. It occurs both at rest and with exertion. A month ago he first noticed burning sensation in the chest while resting. He also noticed occasional episodes of chest discomfort during exertion that prompted him to stop. He said the worst episode of chest pain occurred 2 weeks ago, it was prolonged, lasted 4-5 hours total. He was diaphoretic short of breath and dizzy. His last episode chest discomfort was 2 nights ago that lasted roughly 10 minutes. He says he felt his heart rate was beating fast. He is concerned that  his coronary artery disease has progressed. His EKG today does not show significant change. I will obtain echocardiogram and outpatient Myoview today. Although his last episode of prolonged chest pain was 2 weeks ago, I will obtain a troponin today, if troponin is positive, we will skip the Myoview and go for outpatient cardiac catheterization. We will obtain basic labs include CBC, basic metabolic panel, digoxin level and TSH.    Past Medical History:  Diagnosis Date  . Angina pectoris ([place])   . Arthritis   . Bone spur of left foot   . Chest pain   . COPD (chronic obstructive pulmonary disease) ([place])   . Coronary artery disease   . Diabetes mellitus without complication ([place])   . Headache    pt. denies  . History of bronchitis   . History of pneumonia   . Hyperlipidemia   . Hypertension   . Measles   . Mumps   . Near syncope   . Obesity   . Palpitations     Past Surgical History:  Procedure Laterality Date  . ANTERIOR CERVICAL DECOMP/DISCECTOMY FUSION N/A 04/26/2015   Procedure: Anterior Cervical Discectomy and Fusion - Cervical five-Cervical six with alta interbody;  Surgeon: Kristeen Miss, MD;  Location: [facility];  Service: Neurosurgery;  Laterality: N/A;  . ANTERIOR CERVICAL DISCECTOMY    . CARDIAC CATHETERIZATION    . CARPAL TUNNEL RELEASE Right 07/19/2015   Procedure: Right Carpal Tunnel Release;  Surgeon: Kristeen Miss, MD;  Location: [facility];  Service: Neurosurgery;  Laterality: Right;  Right Carpal tunnel release  . EYE SURGERY Bilateral    cataracts  . FOOT SURGERY Left   . POSTERIOR CERVICAL FUSION/FORAMINOTOMY N/A 07/12/2015   Procedure: Cervical five-six Cervical seven-Thoracic one Posterior cervical fusion with instrumention;  Surgeon: Kristeen Miss, MD;  Location: [facility];  Service: Neurosurgery;  Laterality: N/A;  C5-6 C7-T1 Posterior cervical fusion with lateral mass fixation  . SPINE SURGERY  03/21/13   Lumbar fusion  . ULNAR NERVE TRANSPOSITION Right  07/19/2015   Procedure: Right Ulnar nerve release ;  Surgeon: Kristeen Miss, MD;  Location: [facility];  Service: Neurosurgery;  Laterality: Right;  Right Ulnar nerve release     Current Medications: Outpatient Medications Prior to Visit  Medication Sig Dispense Refill  . aspirin 81 MG tablet Take 81 mg by mouth every evening.     Marland Kitchen atorvastatin (LIPITOR) 40 MG tablet Take 40 mg by mouth every evening.   3  . BAYER CONTOUR NEXT TEST test strip USE TO CHECK BLOOD SUGAR 5 TIMES PER DAY 150 each 3  . buPROPion (WELLBUTRIN SR) 150 MG 12 hr tablet Take 150 mg by mouth 2 (two) times daily.   10  . gabapentin (NEURONTIN) 600 MG tablet Take 600 mg by mouth at bedtime.   3  . Insulin Lispro, Human, (HUMALOG KWIKPEN) 200 UNIT/ML SOPN Inject 130 Units into the skin 3 (three) times daily. Inject 50 units at breakfast, 30 units at lunch and 50 units at dinner (Patient taking differently: Inject 50-80 Units into the skin 3 (three) times daily. Inject 80 units at breakfast, 50 units at lunch and 80 units at dinner) 45 mL 3  . Insulin Pen Needle (BD PEN NEEDLE NANO U/F) 32G X 4 MM MISC Use to inject insulin 4 times daily as instructed. 150 each 3  . lisinopril (PRINIVIL,ZESTRIL) 5 MG tablet Take 5 mg by mouth every evening.   3  . metoprolol succinate (TOPROL-XL) 25 MG 24 hr tablet Take 25 mg by mouth at bedtime.     . nitroGLYCERIN (NITROSTAT) 0.4 MG SL tablet Place 1 tablet (0.4 mg total) under the tongue every 5 (five) minutes as needed for chest pain. 90 tablet 3  . TOUJEO SOLOSTAR 300 UNIT/ML SOPN INJECT 120 UNITS INTO THE SKIN DAILY BEFORE BREAKFAST. (Patient taking differently: INJECT 140 UNITS INTO THE SKIN EVERY EVENING) 9 mL 3  . VICTOZA 18 MG/3ML SOPN Inject 1.8 mg into the skin daily.   3  . digoxin (LANOXIN) 0.25 MG tablet Take 1 tablet (0.25 mg total) by mouth daily. (Patient taking differently: Take 0.25 mg by mouth every evening. ) 90 tablet 1  . alprazolam (XANAX) 2 MG tablet Take 2 mg by mouth  3 (three) times daily as needed for anxiety.   5  . canagliflozin (INVOKANA) 300 MG TABS tablet Take 1 tablet (300 mg total) by mouth daily before breakfast. 30 tablet 3  . pioglitazone (ACTOS) 30 MG tablet TAKE 1 TABLET BY MOUTH DAILY (Patient taking differently: TAKE 1 TABLET (30 mg) BY MOUTH DAILY) 30 tablet 3   No facility-administered medications prior to visit.      Allergies:   Prednisone   Social History   Social History  . Marital status: Married    Spouse name: N/A  . Number of children: N/A  . Years of education: N/A   Social History Main Topics  . Smoking status: Former Smoker  Packs/day: 0.25    Years: 25.00    Types: Cigarettes    Start date: 02/23/2014    Quit date: 03/27/2015  . Smokeless tobacco: Never Used     Comment: Recently quit as of February  2017  . Alcohol use 0.0 oz/week     Comment: rarely  . Drug use: No  . Sexual activity: Not Asked   Other Topics Concern  . None   Social History Narrative  . None     Family History:  The patient's family history includes Diabetes in his brother, father, and mother; Heart disease in his father; Hyperlipidemia in his other; Hypertension in his other.   ROS:   Please see the history of present illness.    ROS All other systems reviewed and are negative.   PHYSICAL EXAM:   VS:  BP 112/60   Pulse 67   Ht 5\' 11"  (1.803 m)   Wt 229 lb (103.9 kg)   SpO2 95%   BMI 31.94 kg/m    GEN: Well nourished, well developed, in no acute distress  HEENT: normal  Neck: no JVD, carotid bruits, or masses Cardiac: RRR; no murmurs, rubs, or gallops,no edema  Respiratory:  clear to auscultation bilaterally, normal work of breathing GI: soft, nontender, nondistended, + BS MS: no deformity or atrophy  Skin: warm and dry, no rash Neuro:  Alert and Oriented x 3, Strength and sensation are intact Psych: euthymic mood, full affect  Wt Readings from Last 3 Encounters:  09/20/15 229 lb (103.9 kg)  09/12/15 226 lb (102.5 kg)   09/10/15 229 lb 9.6 oz (104.1 kg)      Studies/Labs Reviewed:   EKG:  EKG is ordered today.  The ekg ordered today demonstrates NSR without significant ST-T wave changes  Recent Labs: 04/04/2015: ALT 42 09/20/2015: BUN 13; Creat 0.87; Hemoglobin 14.7; Platelets 260; Potassium 5.0; Sodium 134; TSH 1.23   Lipid Panel    Component Value Date/Time   CHOL 143 04/04/2015 0756   TRIG 119.0 04/04/2015 0756   HDL 28.90 (L) 04/04/2015 0756   CHOLHDL 5 04/04/2015 0756   VLDL 23.8 04/04/2015 0756   LDLCALC 91 04/04/2015 0756    Additional studies/ records that were reviewed today include:   Myoview 03/12/2014 Impression Exercise Capacity:  Lexiscan with no exercise. BP Response:  Normal blood pressure response. Clinical Symptoms:  No chest pain. ECG Impression:  No significant ST segment change suggestive of ischemia. Comparison with Prior Nuclear Study: No previous nuclear study performed  Overall Impression:  Normal stress nuclear study.  LV Ejection Fraction: 63%.  LV Wall Motion:  NL LV Function; NL Wall Motion   ASSESSMENT:    1. Chest pain, unspecified chest pain type   2. Long-term use of high-risk medication   3. Coronary artery disease involving native coronary artery of native heart with angina pectoris ([place])   4. Essential hypertension   5. Hyperlipidemia   6. Diabetes mellitus type 2 in obese [facility])      PLAN:  In order of problems listed above:  1.  CAD: has been having prolonged burning sensation in the chest for the past month. The worse episode was 2 weeks ago when the burning sensation in the chest lasted 4-5 hours. Last episode of chest pain was 2 nights ago that lasted 10 minutes. There is no significant EKG changes today. His symptom does occur both during exertion and at rest. His symptom is very concerning, we will obtain echocardiogram and Myoview. We  will obtain basic labs including CBC and basic metabolic panel. We will also obtain troponin today if  troponin is positive, he will skip Myoview and go directly to cath. I am unable to find a previous cath report, however per patient, his previous cath that was done in [facility] in 2014, he was told he had a a moderate blockage in the widow maker artery however it did not need to be fixed at the time.  2. PSVT on digoxin: check digoxin level. He did have some tachycardia 2 night ago with chest pain, will check TSH  3. HTN: BP well-controlled 112/60. Continue on metoprolol and lisinopril  4. HLD: Currently on Lipitor 40 mg daily  5. DM II: Insulin-dependent. Continue to follow-up with his PCP Dr. Dwyane Dee, his Hgb A1C is May was still high at 9.3    Medication Adjustments/Labs and Tests Ordered: Current medicines are reviewed at length with the patient today.  Concerns regarding medicines are outlined above.  Medication changes, Labs and Tests ordered today are listed in the Patient Instructions below. Patient Instructions  Medication Instructions:  None  Labwork: BMET, CBC, TSH, Troponin and Digoxin today.  Testing/Procedures: Your physician has requested that you have an echocardiogram. Echocardiography is a painless test that uses sound waves to create images of your heart. It provides your doctor with information about the size and shape of your heart and how well your heart's chambers and valves are working. This procedure takes approximately one hour. There are no restrictions for this procedure.  Your physician has requested that you have en exercise stress myoview. For further information please visit [email or web address]. Please follow instruction sheet, as given.    Follow-Up: Your physician recommends that you schedule a follow-up appointment in: 2 weeks with any general cardiologist that is accepting new patients.  (Former Forensic psychologist patient)   Any Other Special Instructions Will Be Listed Below (If Applicable).     If you need a refill on your cardiac  medications before your next appointment, please call your pharmacy.      Hilbert Corrigan, [place]  09/20/2015 9:38 PM    [facility] [address], [place], [place]  [address] Phone: [phone]; Fax: [phone]

## 2015-09-20 NOTE — Telephone Encounter (Signed)
Follow up    Patient returning call back to nurse from today - test results.

## 2015-09-23 ENCOUNTER — Telehealth (HOSPITAL_COMMUNITY): Payer: Self-pay | Admitting: *Deleted

## 2015-09-23 NOTE — Telephone Encounter (Signed)
Left message on voicemail in reference to upcoming appointment scheduled for 09/25/15. Phone number given for a call back so details instructions can be given. Sharon S Brooks ° ° °

## 2015-09-24 ENCOUNTER — Telehealth (HOSPITAL_COMMUNITY): Payer: Self-pay | Admitting: *Deleted

## 2015-09-24 NOTE — Telephone Encounter (Signed)
Patient given detailed instructions per Myocardial Perfusion Study Information Sheet for the test on 09/25/15 at 7:15. Patient notified to arrive 15 minutes early and that it is imperative to arrive on time for appointment to keep from having the test rescheduled.  If you need to cancel or reschedule your appointment, please call the office within 24 hours of your appointment. Failure to do so may result in a cancellation of your appointment, and a $50 no show fee. Patient verbalized understanding.Arthur Mata

## 2015-09-25 ENCOUNTER — Ambulatory Visit (HOSPITAL_BASED_OUTPATIENT_CLINIC_OR_DEPARTMENT_OTHER): Payer: 59

## 2015-09-25 DIAGNOSIS — M5013 Cervical disc disorder with radiculopathy, cervicothoracic region: Secondary | ICD-10-CM | POA: Diagnosis not present

## 2015-09-25 DIAGNOSIS — R002 Palpitations: Secondary | ICD-10-CM | POA: Diagnosis not present

## 2015-09-25 DIAGNOSIS — R079 Chest pain, unspecified: Secondary | ICD-10-CM | POA: Diagnosis not present

## 2015-09-25 DIAGNOSIS — Z981 Arthrodesis status: Secondary | ICD-10-CM | POA: Diagnosis not present

## 2015-09-25 DIAGNOSIS — R0609 Other forms of dyspnea: Secondary | ICD-10-CM | POA: Diagnosis not present

## 2015-09-25 DIAGNOSIS — I2510 Atherosclerotic heart disease of native coronary artery without angina pectoris: Secondary | ICD-10-CM | POA: Diagnosis not present

## 2015-09-25 DIAGNOSIS — I10 Essential (primary) hypertension: Secondary | ICD-10-CM | POA: Diagnosis not present

## 2015-09-25 DIAGNOSIS — M5011 Cervical disc disorder with radiculopathy,  high cervical region: Secondary | ICD-10-CM | POA: Diagnosis not present

## 2015-09-25 DIAGNOSIS — M4723 Other spondylosis with radiculopathy, cervicothoracic region: Secondary | ICD-10-CM | POA: Diagnosis not present

## 2015-09-25 DIAGNOSIS — E119 Type 2 diabetes mellitus without complications: Secondary | ICD-10-CM | POA: Diagnosis not present

## 2015-09-25 LAB — MYOCARDIAL PERFUSION IMAGING
CHL CUP NUCLEAR SDS: 5
CHL CUP NUCLEAR SSS: 6
LHR: 0.29
LV sys vol: 32 mL
LVDIAVOL: 103 mL (ref 62–150)
NUC STRESS TID: 0.95
Peak HR: 88 {beats}/min
Rest HR: 60 {beats}/min
SRS: 1

## 2015-09-25 MED ORDER — TECHNETIUM TC 99M TETROFOSMIN IV KIT
30.0000 | PACK | Freq: Once | INTRAVENOUS | Status: AC | PRN
  Administered 2015-09-25: 30 via INTRAVENOUS
  Filled 2015-09-25: qty 30

## 2015-09-25 MED ORDER — TECHNETIUM TC 99M TETROFOSMIN IV KIT
10.8000 | PACK | Freq: Once | INTRAVENOUS | Status: AC | PRN
  Administered 2015-09-25: 11 via INTRAVENOUS
  Filled 2015-09-25: qty 11

## 2015-09-25 MED ORDER — REGADENOSON 0.4 MG/5ML IV SOLN
0.4000 mg | Freq: Once | INTRAVENOUS | Status: AC
  Administered 2015-09-25: 0.4 mg via INTRAVENOUS

## 2015-09-26 ENCOUNTER — Ambulatory Visit (HOSPITAL_COMMUNITY)
Admission: RE | Admit: 2015-09-26 | Discharge: 2015-09-26 | Disposition: A | Discharge status: Home / Self Care | Payer: 59 | Source: Ambulatory Visit | Attending: Neurological Surgery | Admitting: Neurological Surgery

## 2015-09-26 DIAGNOSIS — I10 Essential (primary) hypertension: Secondary | ICD-10-CM | POA: Diagnosis not present

## 2015-09-26 DIAGNOSIS — M5013 Cervical disc disorder with radiculopathy, cervicothoracic region: Secondary | ICD-10-CM | POA: Diagnosis not present

## 2015-09-26 DIAGNOSIS — M5011 Cervical disc disorder with radiculopathy,  high cervical region: Secondary | ICD-10-CM | POA: Insufficient documentation

## 2015-09-26 DIAGNOSIS — M5412 Radiculopathy, cervical region: Secondary | ICD-10-CM

## 2015-09-26 DIAGNOSIS — I2510 Atherosclerotic heart disease of native coronary artery without angina pectoris: Secondary | ICD-10-CM | POA: Diagnosis not present

## 2015-09-26 DIAGNOSIS — E119 Type 2 diabetes mellitus without complications: Secondary | ICD-10-CM | POA: Insufficient documentation

## 2015-09-26 DIAGNOSIS — R079 Chest pain, unspecified: Secondary | ICD-10-CM | POA: Insufficient documentation

## 2015-09-26 DIAGNOSIS — R002 Palpitations: Secondary | ICD-10-CM | POA: Diagnosis not present

## 2015-09-26 DIAGNOSIS — R0609 Other forms of dyspnea: Secondary | ICD-10-CM | POA: Diagnosis not present

## 2015-09-26 DIAGNOSIS — M50221 Other cervical disc displacement at C4-C5 level: Secondary | ICD-10-CM | POA: Diagnosis not present

## 2015-09-26 DIAGNOSIS — M4322 Fusion of spine, cervical region: Secondary | ICD-10-CM | POA: Diagnosis not present

## 2015-09-26 DIAGNOSIS — M4723 Other spondylosis with radiculopathy, cervicothoracic region: Secondary | ICD-10-CM | POA: Diagnosis not present

## 2015-09-26 DIAGNOSIS — Z981 Arthrodesis status: Secondary | ICD-10-CM | POA: Insufficient documentation

## 2015-09-26 DIAGNOSIS — M4722 Other spondylosis with radiculopathy, cervical region: Secondary | ICD-10-CM | POA: Diagnosis not present

## 2015-09-26 LAB — GLUCOSE, CAPILLARY: Glucose-Capillary: 169 mg/dL — ABNORMAL HIGH (ref 65–99)

## 2015-09-26 MED ORDER — OXYCODONE-ACETAMINOPHEN 5-325 MG PO TABS: 1.0000 | ORAL_TABLET | ORAL | Status: DC | PRN

## 2015-09-26 MED ORDER — IOHEXOL 300 MG/ML  SOLN
10.0000 mL | Freq: Once | INTRAMUSCULAR | Status: AC | PRN
  Administered 2015-09-26: 10 mL via INTRATHECAL

## 2015-09-26 MED ORDER — DIAZEPAM 5 MG PO TABS
10.0000 mg | ORAL_TABLET | Freq: Once | ORAL | Status: AC
  Administered 2015-09-26: 10 mg via ORAL

## 2015-09-26 MED ORDER — ONDANSETRON HCL 4 MG/2ML IJ SOLN: 4.0000 mg | Freq: Four times a day (QID) | INTRAMUSCULAR | Status: DC | PRN

## 2015-09-26 MED ORDER — LIDOCAINE HCL 1 % IJ SOLN
INTRAMUSCULAR | Status: AC
  Filled 2015-09-26: qty 10

## 2015-09-26 MED ORDER — LIDOCAINE HCL (PF) 1 % IJ SOLN
5.0000 mL | Freq: Once | INTRAMUSCULAR | Status: AC
  Administered 2015-09-26: 2 mL via INTRADERMAL

## 2015-09-26 MED ORDER — DIAZEPAM 5 MG PO TABS
ORAL_TABLET | ORAL | Status: AC
  Filled 2015-09-26: qty 2

## 2015-09-26 MED ORDER — LIDOCAINE HCL (PF) 1 % IJ SOLN: 2.0000 mL | Freq: Once | INTRAMUSCULAR | Status: DC

## 2015-09-26 NOTE — Procedures (Signed)
Date of procedure: 09/26/2015 Preoperative diagnosis: Cervical spondylosis with radiculopathy left C8, T1 Postoperative diagnosis: Cervical spondylosis with radiculopathy left C8 T1 Procedure: Cervical myelogram Indications: Mr. Arthur Mata is a 59 year old individual who's had significant cervical spondylosis at both C5-6 and again at C7-T1. Is undergone posterior decompression and fusion using a D Traxx technique with a posterior cervical fusion of the facets at C7-T1. He also had an anterior decompression at C5-C6 some months ago. His had persistent left cervical radiculopathy. He's previously had a cervical myelogram and MRI previously did not demonstrate adequately his pathology. Repeat myelogram is now being performed.  Pre op Dx: Cervical spondylosis with radiculopathy Post op Dx: Cervical spondylosis with radiculopathy Procedure: Cervical myelogram Surgeon: Jaishawn Witzke Puncture level: L2-3 Fluid color: Clear colorless Injection: Iohexol 300, 10 mL Findings: Demonstration of fusion lower cervical spine, spondylosis at C7-T1. Further evaluation of anatomy with CT scanning

## 2015-09-26 NOTE — Discharge Instructions (Signed)
Myelography, Care After °These instructions give you information on caring for yourself after your procedure. Your doctor may also give you more specific instructions. Call your doctor if you have any problems or questions after your procedure. °HOME CARE °· Rest the first day. °· When you rest, lie flat, with your head slightly raised (elevated). °· Avoid heavy lifting and activity for 48 hours, or as told by your doctor. °· You may take the bandage (dressing) off one day after the test, or as told by your doctor. °· Take all medicines only as told by your doctor. °· Ask your doctor when it is okay to take a shower or bath. °· Ask your doctor when your test results will be ready and how you can get them. Make sure you follow up and get your results. °· Do not drink alcohol for 24 hours, or as told by your doctor. °· Drink enough fluid to keep your pee (urine) clear or pale yellow. °GET HELP IF:  °· You have a fever. °· You have a headache. °· You feel sick to your stomach (nauseous) or throw up (vomit). °· You have pain or cramping in your belly (abdomen). °GET HELP RIGHT AWAY IF:  °· You have a headache with a stiff neck or fever. °· You have trouble breathing. °· Any of the places where the needles were put in are: °¨ Puffy (swollen) or red. °¨ Sore or hot to the touch. °¨ Draining yellowish-white fluid (pus). °¨ Bleeding. °MAKE SURE YOU: °· Understand these instructions. °· Will watch your condition. °· Will get help right away if you are not doing well or get worse. °  °This information is not intended to replace advice given to you by your health care provider. Make sure you discuss any questions you have with your health care provider. °  °Document Released: 11/19/2007 Document Revised: 03/02/2014 Document Reviewed: 11/04/2011 °Elsevier Interactive Patient Education ©2016 Elsevier Inc. ° °

## 2015-09-30 MED FILL — TOUJEO SOLOSTAR 300 UNITS/M: 300 | 25 days supply | Qty: 9 | Fill #5

## 2015-10-02 DIAGNOSIS — Z6832 Body mass index (BMI) 32.0-32.9, adult: Secondary | ICD-10-CM | POA: Diagnosis not present

## 2015-10-02 DIAGNOSIS — I10 Essential (primary) hypertension: Secondary | ICD-10-CM | POA: Diagnosis not present

## 2015-10-02 DIAGNOSIS — M5412 Radiculopathy, cervical region: Secondary | ICD-10-CM | POA: Diagnosis not present

## 2015-10-03 ENCOUNTER — Ambulatory Visit (HOSPITAL_COMMUNITY): Payer: 59 | Attending: Cardiovascular Disease

## 2015-10-03 ENCOUNTER — Other Ambulatory Visit: Payer: Self-pay | Admitting: Neurological Surgery

## 2015-10-03 ENCOUNTER — Other Ambulatory Visit: Payer: Self-pay

## 2015-10-03 DIAGNOSIS — R079 Chest pain, unspecified: Secondary | ICD-10-CM | POA: Insufficient documentation

## 2015-10-03 DIAGNOSIS — Z8249 Family history of ischemic heart disease and other diseases of the circulatory system: Secondary | ICD-10-CM | POA: Insufficient documentation

## 2015-10-03 DIAGNOSIS — I119 Hypertensive heart disease without heart failure: Secondary | ICD-10-CM | POA: Insufficient documentation

## 2015-10-03 DIAGNOSIS — J449 Chronic obstructive pulmonary disease, unspecified: Secondary | ICD-10-CM | POA: Diagnosis not present

## 2015-10-03 DIAGNOSIS — I059 Rheumatic mitral valve disease, unspecified: Secondary | ICD-10-CM | POA: Diagnosis not present

## 2015-10-03 DIAGNOSIS — E119 Type 2 diabetes mellitus without complications: Secondary | ICD-10-CM | POA: Diagnosis not present

## 2015-10-10 MED FILL — BUPROPION HCL SR 150 MG TAB: 150 | 30 days supply | Qty: 60 | Fill #2

## 2015-10-10 MED FILL — METOPROLOL SUCC ER 25 MG TA: 25 | 90 days supply | Qty: 90 | Fill #1

## 2015-10-11 ENCOUNTER — Encounter (HOSPITAL_COMMUNITY): Payer: Self-pay

## 2015-10-11 ENCOUNTER — Encounter (HOSPITAL_COMMUNITY)
Admission: RE | Admit: 2015-10-11 | Discharge: 2015-10-11 | Disposition: A | Discharge status: Home / Self Care | Payer: 59 | Source: Ambulatory Visit | Attending: Neurological Surgery | Admitting: Neurological Surgery

## 2015-10-11 DIAGNOSIS — J449 Chronic obstructive pulmonary disease, unspecified: Secondary | ICD-10-CM | POA: Diagnosis not present

## 2015-10-11 DIAGNOSIS — Z87891 Personal history of nicotine dependence: Secondary | ICD-10-CM | POA: Diagnosis not present

## 2015-10-11 DIAGNOSIS — Z01818 Encounter for other preprocedural examination: Secondary | ICD-10-CM | POA: Insufficient documentation

## 2015-10-11 DIAGNOSIS — K219 Gastro-esophageal reflux disease without esophagitis: Secondary | ICD-10-CM | POA: Diagnosis not present

## 2015-10-11 DIAGNOSIS — M5412 Radiculopathy, cervical region: Secondary | ICD-10-CM | POA: Diagnosis not present

## 2015-10-11 DIAGNOSIS — Z01812 Encounter for preprocedural laboratory examination: Secondary | ICD-10-CM | POA: Insufficient documentation

## 2015-10-11 DIAGNOSIS — I10 Essential (primary) hypertension: Secondary | ICD-10-CM | POA: Diagnosis not present

## 2015-10-11 DIAGNOSIS — Z981 Arthrodesis status: Secondary | ICD-10-CM | POA: Diagnosis not present

## 2015-10-11 DIAGNOSIS — E119 Type 2 diabetes mellitus without complications: Secondary | ICD-10-CM | POA: Diagnosis not present

## 2015-10-11 DIAGNOSIS — Z79899 Other long term (current) drug therapy: Secondary | ICD-10-CM | POA: Diagnosis not present

## 2015-10-11 DIAGNOSIS — I2510 Atherosclerotic heart disease of native coronary artery without angina pectoris: Secondary | ICD-10-CM | POA: Insufficient documentation

## 2015-10-11 DIAGNOSIS — Z7982 Long term (current) use of aspirin: Secondary | ICD-10-CM | POA: Diagnosis not present

## 2015-10-11 DIAGNOSIS — E785 Hyperlipidemia, unspecified: Secondary | ICD-10-CM | POA: Diagnosis not present

## 2015-10-11 DIAGNOSIS — Z794 Long term (current) use of insulin: Secondary | ICD-10-CM | POA: Diagnosis not present

## 2015-10-11 HISTORY — DX: Polyneuropathy, unspecified: G62.9

## 2015-10-11 HISTORY — DX: Pneumonia, unspecified organism: J18.9

## 2015-10-11 HISTORY — DX: Personal history of other diseases of the digestive system: Z87.19

## 2015-10-11 HISTORY — DX: Cardiac arrhythmia, unspecified: I49.9

## 2015-10-11 HISTORY — DX: Gastro-esophageal reflux disease without esophagitis: K21.9

## 2015-10-11 LAB — BASIC METABOLIC PANEL
ANION GAP: 8 (ref 5–15)
BUN: 14 mg/dL (ref 6–20)
CALCIUM: 9.6 mg/dL (ref 8.9–10.3)
CHLORIDE: 102 mmol/L (ref 101–111)
CO2: 23 mmol/L (ref 22–32)
Creatinine, Ser: 1.08 mg/dL (ref 0.61–1.24)
Glucose, Bld: 335 mg/dL — ABNORMAL HIGH (ref 65–99)
POTASSIUM: 4.6 mmol/L (ref 3.5–5.1)
SODIUM: 133 mmol/L — AB (ref 135–145)

## 2015-10-11 LAB — CBC
HEMATOCRIT: 43.5 % (ref 39.0–52.0)
HEMOGLOBIN: 14.6 g/dL (ref 13.0–17.0)
MCH: 30.4 pg (ref 26.0–34.0)
MCHC: 33.6 g/dL (ref 30.0–36.0)
MCV: 90.4 fL (ref 78.0–100.0)
Platelets: 222 10*3/uL (ref 150–400)
RBC: 4.81 MIL/uL (ref 4.22–5.81)
RDW: 12.7 % (ref 11.5–15.5)
WBC: 11.7 10*3/uL — AB (ref 4.0–10.5)

## 2015-10-11 LAB — SURGICAL PCR SCREEN
MRSA, PCR: NEGATIVE
STAPHYLOCOCCUS AUREUS: NEGATIVE

## 2015-10-11 LAB — GLUCOSE, CAPILLARY: Glucose-Capillary: 307 mg/dL — ABNORMAL HIGH (ref 65–99)

## 2015-10-11 NOTE — Progress Notes (Signed)
   10/11/15 1327  OBSTRUCTIVE SLEEP APNEA  Have you ever been diagnosed with sleep apnea through a sleep study? No  Do you snore loudly (loud enough to be heard through closed doors)?  1  Do you often feel tired, fatigued, or sleepy during the daytime (such as falling asleep during driving or talking to someone)? 0  Has anyone observed you stop breathing during your sleep? 1  Do you have, or are you being treated for high blood pressure? 1  BMI more than 35 kg/m2? 0  Age > 50 (1-yes) 1  Neck circumference greater than:Male 16 inches or larger, Male 17inches or larger? 1  Male Gender (Yes=1) 1  Obstructive Sleep Apnea Score 6  Score 5 or greater  Results sent to PCP

## 2015-10-11 NOTE — Pre-Procedure Instructions (Addendum)
Arthur Mata  10/11/2015    Your procedure is scheduled on Monday, October 21, 2015 at 11:00 AM.   Report to [facility] Entrance "A" Admitting Office at 8:00 AM.   Call this number if you have problems the morning of surgery: [phone]   Questions prior to day of surgery, please call [phone] between 8 & 4 PM.   Remember:  Do not eat food or drink liquids after midnight Sunday, 10/20/15.  Take these medicines the morning of surgery with A SIP OF WATER: Bupropion (Wellbutrin), Digoxin (Lanoxin), Alprazolam (Xanax) - if needed  Stop Aspirin 7 days prior to surgery.     How to Manage Your Diabetes Before Surgery   Why is it important to control my blood sugar before and after surgery?   Improving blood sugar levels before and after surgery helps healing and can limit problems.  A way of improving blood sugar control is eating a healthy diet by:  - Eating less sugar and carbohydrates  - Increasing activity/exercise  - Talk with your doctor about reaching your blood sugar goals  High blood sugars (greater than 180 mg/dL) can raise your risk of infections and slow down your recovery so you will need to focus on controlling your diabetes during the weeks before surgery.  Make sure that the doctor who takes care of your diabetes knows about your planned surgery including the date and location.  How do I manage my blood sugars before surgery?   Check your blood sugar at least 4 times a day, 2 days before surgery to make sure that they are not too high or low.  Check your blood sugar the morning of your surgery when you wake up and every 2 hours until you get to the Short-Stay unit.  Treat a low blood sugar (less than 70 mg/dL) with 1/2 cup of clear juice (cranberry or apple), 4 glucose tablets, OR glucose gel.  Recheck blood sugar in 15 minutes after treatment (to make sure it is greater than 70 mg/dL).  If blood sugar is not greater than 70 mg/dL on re-check, call  [phone] for further instructions.   Report your blood sugar to the Short-Stay nurse when you get to Short-Stay.  References:  [facility], 2007 "How to Manage your Diabetes Before and After Surgery".  What do I do about my diabetes medications?   Do not take Victoza or Humalog Insulin the morning of surgery.  THE NIGHT BEFORE SURGERY, take 70 units of Toujeo Insulin.   Do not wear jewelry.  Do not wear lotions, powders, or cologne.  You may wear deodorant.  Men may shave face and neck.  Do not bring valuables to the hospital.  [facility] is not responsible for any belongings or valuables.  Contacts, dentures or bridgework may not be worn into surgery.  Leave your suitcase in the car.  After surgery it may be brought to your room.  For patients admitted to the hospital, discharge time will be determined by your treatment team.  Special instructions: [place] - Preparing for Surgery  Before surgery, you can play an important role.  Because skin is not sterile, your skin needs to be as free of germs as possible.  You can reduce the number of germs on you skin by washing with CHG (chlorahexidine gluconate) soap before surgery.  CHG is an antiseptic cleaner which kills germs and bonds with the skin to continue killing germs even after washing.  Please DO NOT use if you have an allergy to CHG or antibacterial soaps.  If your skin becomes reddened/irritated stop using the CHG and inform your nurse when you arrive at Short Stay.  Do not shave (including legs and underarms) for at least 48 hours prior to the first CHG shower.  You may shave your face.  Please follow these instructions carefully:   1.  Shower with CHG Soap the night before surgery and the                    morning of Surgery.  2.  If you choose to wash your hair, wash your hair first as usual with your       normal shampoo.  3.  After you shampoo, rinse your hair and body thoroughly to  remove the shampoo.  4.  Use CHG as you would any other liquid soap.  You can apply chg directly       to the skin and wash gently with scrungie or a clean washcloth.  5.  Apply the CHG Soap to your body ONLY FROM THE NECK DOWN.        Do not use on open wounds or open sores.  Avoid contact with your eyes, ears, mouth and genitals (private parts).  Wash genitals (private parts) with your normal soap.  6.  Wash thoroughly, paying special attention to the area where your surgery        will be performed.  7.  Thoroughly rinse your body with warm water from the neck down.  8.  DO NOT shower/wash with your normal soap after using and rinsing off       the CHG Soap.  9.  Pat yourself dry with a clean towel.            10.  Wear clean pajamas.            11.  Place clean sheets on your bed the night of your first shower and do not        sleep with pets.  Day of Surgery  Do not apply any lotions the morning of surgery.  Please wear clean clothes to the hospital.   Please read over the following fact sheets that you were given. Pain Booklet, Coughing and Deep Breathing, MRSA Information and Surgical Site Infection Prevention

## 2015-10-11 NOTE — Progress Notes (Signed)
Pt has hx of CAD, 35% blockage in LAD in 2014, no stents. Had Stress and Echo done this month. Cardiologist is Dr. Girtha Rm now, Dr. Mare Ferrari retired. Pt denies any chest pain or sob. Pt is diabetic. Last A1C in March was 9.3. States his fasting blood sugar usually runs around 140. Today's CBG was 307, but he had just eaten about 45-60 min prior to PAT appt.

## 2015-10-13 LAB — HEMOGLOBIN A1C
HEMOGLOBIN A1C: 10.2 % — AB (ref 4.8–5.6)
MEAN PLASMA GLUCOSE: 246 mg/dL

## 2015-10-14 ENCOUNTER — Other Ambulatory Visit (HOSPITAL_COMMUNITY): Payer: 59

## 2015-10-14 NOTE — Progress Notes (Signed)
Anesthesia Chart Review:  Pt is a 60 year old male scheduled for C7-T1 ACDF on 10/21/2015 with Kristeen Miss, MD.   Pt's cardiologist retired, saw [place], [place] on 09/20/15 for chest pain. Echo and stress below; pt was cleared for surgery.   PMH includes: CAD, HTN, angina, DM, palpitations (sinus tachycardia vs SVT), hyperlipidemia, COPD, GERD. Former smoker (quit 03/27/15). BMI 32. S/p carpal tunnel release 07/19/15. S/p C5-T1 posterior cervical fusion 07/12/15. S/p ACDF 04/26/15.   Medications include: ASA, lipitor, digoxin, humalog, victoza, lisinopril, metoprolol, toujeo.   Preoperative labs reviewed. HgbA1c 10.2, glucose 335. I notified Janett Billow in Dr. Clarice Pole office of uncontrolled diabetes.   EKG 09/20/15: NSR.   Echo 10/03/15:  - Left ventricle: The cavity size was normal. There was mild concentric hypertrophy. Systolic function was normal. The estimated ejection fraction was in the range of 60% to 65%. Wall motion was normal; there were no regional wall motion abnormalities. Left ventricular diastolic function parameters were normal. - Mitral valve: Calcified annulus.  Nuclear stress test 09/25/15:   stress EF: 69%.  There was no ST segment deviation noted during stress.  The study is normal.  This is a low risk study.  The left ventricular ejection fraction is hyperdynamic (>65%). Normal resting and stress perfusion. No ischemia or infarction EF 69%  Cardiac cath 11/17/12 ([place], [place]): - LM patent - mid LAD 30% lesion - LCx prox 40% lesion - RCA: mid and distal 20-30% lesion - EF 65%  If blood glucose acceptable DOS, I anticipate pt can proceed as scheduled.   Willeen Cass, FNP-BC [facility] Short Stay Surgical Center/Anesthesiology Phone: [phone] 10/14/2015 4:38 PM

## 2015-10-17 ENCOUNTER — Other Ambulatory Visit: Payer: Self-pay | Admitting: Endocrinology

## 2015-10-17 MED FILL — HUMALOG 200 UNITS/ML KWIKPE: 200 | 28 days supply | Qty: 36 | Fill #2

## 2015-10-18 ENCOUNTER — Encounter: Payer: Self-pay | Admitting: Internal Medicine

## 2015-10-18 ENCOUNTER — Ambulatory Visit (INDEPENDENT_AMBULATORY_CARE_PROVIDER_SITE_OTHER): Payer: 59 | Admitting: Internal Medicine

## 2015-10-18 VITALS — BP 153/81 | HR 72 | Ht 70.75 in | Wt 230.0 lb

## 2015-10-18 DIAGNOSIS — E669 Obesity, unspecified: Secondary | ICD-10-CM

## 2015-10-18 DIAGNOSIS — E785 Hyperlipidemia, unspecified: Secondary | ICD-10-CM

## 2015-10-18 DIAGNOSIS — R079 Chest pain, unspecified: Secondary | ICD-10-CM

## 2015-10-18 DIAGNOSIS — E1169 Type 2 diabetes mellitus with other specified complication: Secondary | ICD-10-CM

## 2015-10-18 DIAGNOSIS — I10 Essential (primary) hypertension: Secondary | ICD-10-CM | POA: Insufficient documentation

## 2015-10-18 DIAGNOSIS — I25119 Atherosclerotic heart disease of native coronary artery with unspecified angina pectoris: Secondary | ICD-10-CM | POA: Diagnosis not present

## 2015-10-18 DIAGNOSIS — E119 Type 2 diabetes mellitus without complications: Secondary | ICD-10-CM | POA: Diagnosis not present

## 2015-10-18 DIAGNOSIS — K219 Gastro-esophageal reflux disease without esophagitis: Secondary | ICD-10-CM | POA: Insufficient documentation

## 2015-10-18 MED ORDER — ESOMEPRAZOLE MAGNESIUM 40 MG PO CPDR: 40.0000 mg | DELAYED_RELEASE_CAPSULE | Freq: Every day | ORAL | 5 refills | Status: DC

## 2015-10-18 MED ORDER — CEFAZOLIN SODIUM-DEXTROSE 2-4 GM/100ML-% IV SOLN
2.0000 g | INTRAVENOUS | Status: AC
  Administered 2015-10-21: 2 g via INTRAVENOUS
  Filled 2015-10-18: qty 100

## 2015-10-18 MED FILL — TOUJEO SOLOSTAR 300 UNITS/M: 300 | 23 days supply | Qty: 9 | Fill #0

## 2015-10-18 MED FILL — ESOMEPRAZOLE MAG DR 40 MG C: 40 | 90 days supply | Qty: 90 | Fill #0

## 2015-10-18 NOTE — Patient Instructions (Addendum)
Your physician has recommended you make the following change in your medication:  1. START nexium 40mg  once daily 2. STOP Antiacid 3. It is OK to take Tums  Your physician recommends that you schedule a follow-up appointment in: [place] with Dr. Debara Pickett

## 2015-10-18 NOTE — Progress Notes (Signed)
OFFICE NOTE  Chief Complaint:  Follow-up stress test and echocardiogram  Primary Care Physician: Arthur Lowers, MD  HPI:  Arthur Mata is a 60 y.o. male who recently saw Arthur Deforest, PA-C, for ongoing chest pain. He has a past medical history significant for coronary artery disease, last assessed in 2014 by heart catheterization in [place], [place]. He was previous he followed by Dr. Warren Mata prior to his retirement. Arthur Mata is been having significant anterior chest pain which she thinks is mostly like heartburn. He describes as a burning sensation that travels from the upper mid epigastrium up to his throat and is more likely to be present at rest rather than with exertion. He reports his wife has been giving him a "over-the-counter medication" for his heartburn, but was not clear as to what that was. I advised him to call his wife during the office visit and she noted that it was Prilosec. He does not often take it. He also says he's using Tums very regularly. In the past he had used Nexium. He was referred for stress testing given his history of coronary disease and underwent an echo and nuclear stress test. Both the echo and stress test were fairly normal and showed normal LV function. He is scheduled to have cervical surgery after recent trauma cause problems with his neck. He also is noted that he has difficulty swallowing and has food that sticks in his throat. He apparently was seen by an ENT who did a fiberoptic exam and said that he could not find a reason for swallowing problem.  PMHx:  Past Medical History:  Diagnosis Date  . Angina pectoris ([place])   . Arthritis   . Bone spur of left foot   . Chest pain   . COPD (chronic obstructive pulmonary disease) ([place])   . Coronary artery disease    no stents - some blockage (35% in "widowmaker) - 2014  . Diabetes mellitus without complication ([place])    type 2  . Dysrhythmia    goes fast at times  . GERD (gastroesophageal  reflux disease)   . History of bronchitis   . History of hiatal hernia   . History of pneumonia   . Hyperlipidemia   . Hypertension   . Measles   . Mumps   . Near syncope   . Neuropathy ([place])   . Obesity   . Palpitations   . Pneumonia     Past Surgical History:  Procedure Laterality Date  . ANTERIOR CERVICAL DECOMP/DISCECTOMY FUSION N/A 04/26/2015   Procedure: Anterior Cervical Discectomy and Fusion - Cervical five-Cervical six with alta interbody;  Surgeon: Kristeen Miss, MD;  Location: [facility];  Service: Neurosurgery;  Laterality: N/A;  . ANTERIOR CERVICAL DISCECTOMY    . CARDIAC CATHETERIZATION    . CARPAL TUNNEL RELEASE Right 07/19/2015   Procedure: Right Carpal Tunnel Release;  Surgeon: Kristeen Miss, MD;  Location: [facility];  Service: Neurosurgery;  Laterality: Right;  Right Carpal tunnel release  . COLONOSCOPY    . EYE SURGERY Bilateral    cataracts  . FOOT SURGERY Left   . POSTERIOR CERVICAL FUSION/FORAMINOTOMY N/A 07/12/2015   Procedure: Cervical five-six Cervical seven-Thoracic one Posterior cervical fusion with instrumention;  Surgeon: Kristeen Miss, MD;  Location: [facility];  Service: Neurosurgery;  Laterality: N/A;  C5-6 C7-T1 Posterior cervical fusion with lateral mass fixation  . SPINE SURGERY  03/21/13   Lumbar fusion  . ULNAR NERVE TRANSPOSITION Right  07/19/2015   Procedure: Right Ulnar nerve release ;  Surgeon: Kristeen Miss, MD;  Location: [facility];  Service: Neurosurgery;  Laterality: Right;  Right Ulnar nerve release     FAMHx:  Family History  Problem Relation Age of Onset  . Diabetes Mother   . Heart attack Mother   . Diabetes Father   . Heart disease Father   . Heart attack Father   . Diabetes Brother   . Hyperlipidemia Other   . Hypertension Other     SOCHx:   reports that he quit smoking about 6 months ago. His smoking use included Cigarettes. He started smoking about 19 months ago. He has a 6.25 pack-year smoking history. He has never  used smokeless tobacco. He reports that he drinks alcohol. He reports that he does not use drugs.  ALLERGIES:  Allergies  Allergen Reactions  . Prednisone Other (See Comments)    Run sugar over 600 causes hospitalization    ROS: Pertinent items noted in HPI and remainder of comprehensive ROS otherwise negative.  HOME MEDS: Current Outpatient Prescriptions  Medication Sig Dispense Refill  . alprazolam (XANAX) 2 MG tablet Take 1-2 mg by mouth 2 (two) times daily as needed for anxiety.  5  . aspirin 81 MG tablet Take 81 mg by mouth every evening.     Marland Kitchen atorvastatin (LIPITOR) 40 MG tablet Take 40 mg by mouth every evening.   3  . buPROPion (WELLBUTRIN SR) 150 MG 12 hr tablet Take 150 mg by mouth 2 (two) times daily.   10  . digoxin (LANOXIN) 0.25 MG tablet Take 0.25 mg by mouth daily.    Marland Kitchen gabapentin (NEURONTIN) 600 MG tablet Take 600 mg by mouth at bedtime.   3  . Insulin Lispro, Human, (HUMALOG KWIKPEN) 200 UNIT/ML SOPN Inject 130 Units into the skin 3 (three) times daily. Inject 50 units at breakfast, 30 units at lunch and 50 units at dinner (Patient taking differently: Inject 50-80 Units into the skin 3 (three) times daily. Inject 80 units at breakfast, 50 units at lunch and 80 units at dinner) 45 mL 3  . lisinopril (PRINIVIL,ZESTRIL) 5 MG tablet Take 5 mg by mouth every evening.   3  . metoprolol succinate (TOPROL-XL) 25 MG 24 hr tablet Take 25 mg by mouth at bedtime.     . nitroGLYCERIN (NITROSTAT) 0.4 MG SL tablet Place 1 tablet (0.4 mg total) under the tongue every 5 (five) minutes as needed for chest pain. 90 tablet 3  . TOUJEO SOLOSTAR 300 UNIT/ML SOPN INJECT 120 UNITS INTO THE SKIN DAILY BEFORE BREAKFAST. 13.5 mL 3  . VICTOZA 18 MG/3ML SOPN Inject 1.8 mg into the skin daily.   3  . esomeprazole (NEXIUM) 40 MG capsule Take 1 capsule (40 mg total) by mouth daily. 30 capsule 5   No current facility-administered medications for this visit.     LABS/IMAGING: No results found for  this or any previous visit (from the past 48 hour(s)). No results found.  WEIGHTS: Wt Readings from Last 3 Encounters:  10/18/15 230 lb (104.3 kg)  10/11/15 230 lb (104.3 kg)  09/26/15 229 lb (103.9 kg)    VITALS: BP (!) 153/81   Pulse 72   Ht 5' 10.75" (1.797 m)   Wt 230 lb (104.3 kg)   SpO2 97%   BMI 32.31 kg/m   EXAM: Deferred  EKG: Deferred  ASSESSMENT: 1. Chest pain-likely GERD 2. History of coronary disease - negative nuclear stress test and  normal LV function by echo (2017) 3. Type 2 diabetes 4. Hypertension 5. Dyslipidemia  PLAN: 1.   Mr. Asai has been having some recent symptoms which are reminiscent of heartburn. He's been taking an over-the-counter medication which may be omeprazole but not regularly and has used Tums with some relief but has had to take quite a few of them. His stress test was low risk and echo showed normal LV function. Last cath in 2014 showed no new obstructive disease. I would recommend starting Nexium 40 mg daily. He is cleared to go ahead with upcoming cervical spine surgery next week. Follow-up with me in 3-6 months.  Pixie Casino, MD, [facility] Attending Cardiologist [place] [facility] 10/18/2015, 5:22 PM

## 2015-10-21 ENCOUNTER — Observation Stay (HOSPITAL_COMMUNITY)
Admission: RE | Admit: 2015-10-21 | Discharge: 2015-10-22 | Disposition: A | Discharge status: Home / Self Care | Payer: 59 | Source: Ambulatory Visit | Attending: Neurological Surgery | Admitting: Neurological Surgery

## 2015-10-21 ENCOUNTER — Ambulatory Visit (HOSPITAL_COMMUNITY): Payer: 59

## 2015-10-21 ENCOUNTER — Encounter (HOSPITAL_COMMUNITY)
Admission: RE | Disposition: A | Discharge status: Home / Self Care | Payer: Self-pay | Source: Ambulatory Visit | Attending: Neurological Surgery

## 2015-10-21 ENCOUNTER — Ambulatory Visit (HOSPITAL_COMMUNITY): Payer: 59 | Admitting: Anesthesiology

## 2015-10-21 ENCOUNTER — Ambulatory Visit (HOSPITAL_COMMUNITY): Payer: 59 | Admitting: Emergency Medicine

## 2015-10-21 ENCOUNTER — Encounter (HOSPITAL_COMMUNITY): Payer: Self-pay | Admitting: *Deleted

## 2015-10-21 DIAGNOSIS — Z7982 Long term (current) use of aspirin: Secondary | ICD-10-CM | POA: Diagnosis not present

## 2015-10-21 DIAGNOSIS — E785 Hyperlipidemia, unspecified: Secondary | ICD-10-CM | POA: Diagnosis not present

## 2015-10-21 DIAGNOSIS — M5013 Cervical disc disorder with radiculopathy, cervicothoracic region: Secondary | ICD-10-CM | POA: Diagnosis not present

## 2015-10-21 DIAGNOSIS — Z6832 Body mass index (BMI) 32.0-32.9, adult: Secondary | ICD-10-CM | POA: Diagnosis not present

## 2015-10-21 DIAGNOSIS — Z79899 Other long term (current) drug therapy: Secondary | ICD-10-CM | POA: Diagnosis not present

## 2015-10-21 DIAGNOSIS — J449 Chronic obstructive pulmonary disease, unspecified: Secondary | ICD-10-CM | POA: Diagnosis not present

## 2015-10-21 DIAGNOSIS — Z794 Long term (current) use of insulin: Secondary | ICD-10-CM | POA: Insufficient documentation

## 2015-10-21 DIAGNOSIS — E669 Obesity, unspecified: Secondary | ICD-10-CM | POA: Insufficient documentation

## 2015-10-21 DIAGNOSIS — M4723 Other spondylosis with radiculopathy, cervicothoracic region: Principal | ICD-10-CM | POA: Insufficient documentation

## 2015-10-21 DIAGNOSIS — Z981 Arthrodesis status: Secondary | ICD-10-CM | POA: Diagnosis not present

## 2015-10-21 DIAGNOSIS — M5412 Radiculopathy, cervical region: Secondary | ICD-10-CM | POA: Diagnosis not present

## 2015-10-21 DIAGNOSIS — Z87891 Personal history of nicotine dependence: Secondary | ICD-10-CM | POA: Diagnosis not present

## 2015-10-21 DIAGNOSIS — E1142 Type 2 diabetes mellitus with diabetic polyneuropathy: Secondary | ICD-10-CM | POA: Insufficient documentation

## 2015-10-21 DIAGNOSIS — M4722 Other spondylosis with radiculopathy, cervical region: Secondary | ICD-10-CM | POA: Diagnosis present

## 2015-10-21 DIAGNOSIS — I2510 Atherosclerotic heart disease of native coronary artery without angina pectoris: Secondary | ICD-10-CM | POA: Diagnosis not present

## 2015-10-21 DIAGNOSIS — Z419 Encounter for procedure for purposes other than remedying health state, unspecified: Secondary | ICD-10-CM

## 2015-10-21 DIAGNOSIS — K219 Gastro-esophageal reflux disease without esophagitis: Secondary | ICD-10-CM | POA: Insufficient documentation

## 2015-10-21 DIAGNOSIS — I10 Essential (primary) hypertension: Secondary | ICD-10-CM | POA: Insufficient documentation

## 2015-10-21 HISTORY — PX: ANTERIOR CERVICAL DECOMP/DISCECTOMY FUSION: SHX1161

## 2015-10-21 LAB — GLUCOSE, CAPILLARY
GLUCOSE-CAPILLARY: 144 mg/dL — AB (ref 65–99)
GLUCOSE-CAPILLARY: 123 mg/dL — AB (ref 65–99)
GLUCOSE-CAPILLARY: 88 mg/dL (ref 65–99)
Glucose-Capillary: 95 mg/dL (ref 65–99)
Glucose-Capillary: 194 mg/dL — ABNORMAL HIGH (ref 65–99)

## 2015-10-21 LAB — TYPE AND SCREEN
ABO/RH(D): A NEG
Antibody Screen: NEGATIVE

## 2015-10-21 SURGERY — ANTERIOR CERVICAL DECOMPRESSION/DISCECTOMY FUSION 1 LEVEL
Anesthesia: General

## 2015-10-21 MED ORDER — METOPROLOL SUCCINATE ER 25 MG PO TB24
25.0000 mg | ORAL_TABLET | Freq: Every day | ORAL | Status: DC
  Administered 2015-10-21: 25 mg via ORAL
  Filled 2015-10-21: qty 1

## 2015-10-21 MED ORDER — HYDROCODONE-ACETAMINOPHEN 5-325 MG PO TABS
ORAL_TABLET | ORAL | Status: AC
  Filled 2015-10-21: qty 2

## 2015-10-21 MED ORDER — PANTOPRAZOLE SODIUM 40 MG PO TBEC
80.0000 mg | DELAYED_RELEASE_TABLET | Freq: Every day | ORAL | Status: DC
  Administered 2015-10-21: 80 mg via ORAL
  Filled 2015-10-21: qty 2

## 2015-10-21 MED ORDER — PHENYLEPHRINE HCL 10 MG/ML IJ SOLN
INTRAMUSCULAR | Status: DC | PRN
  Administered 2015-10-21 (×4): 80 ug via INTRAVENOUS

## 2015-10-21 MED ORDER — LIDOCAINE 2% (20 MG/ML) 5 ML SYRINGE
INTRAMUSCULAR | Status: AC
  Filled 2015-10-21: qty 10

## 2015-10-21 MED ORDER — METHOCARBAMOL 1000 MG/10ML IJ SOLN
500.0000 mg | Freq: Four times a day (QID) | INTRAVENOUS | Status: DC | PRN
  Filled 2015-10-21: qty 5

## 2015-10-21 MED ORDER — BISACODYL 10 MG RE SUPP: 10.0000 mg | Freq: Every day | RECTAL | Status: DC | PRN

## 2015-10-21 MED ORDER — EPHEDRINE 5 MG/ML INJ
INTRAVENOUS | Status: AC
  Filled 2015-10-21: qty 10

## 2015-10-21 MED ORDER — HYDROCODONE-ACETAMINOPHEN 5-325 MG PO TABS
1.0000 | ORAL_TABLET | ORAL | Status: DC | PRN
  Administered 2015-10-21: 2 via ORAL

## 2015-10-21 MED ORDER — LIRAGLUTIDE 18 MG/3ML ~~LOC~~ SOPN: 1.8000 mg | PEN_INJECTOR | Freq: Every day | SUBCUTANEOUS | Status: DC

## 2015-10-21 MED ORDER — ATORVASTATIN CALCIUM 20 MG PO TABS
40.0000 mg | ORAL_TABLET | Freq: Every evening | ORAL | Status: DC
Start: 2015-10-21 — End: 2015-10-22
  Administered 2015-10-21: 40 mg via ORAL
  Filled 2015-10-21: qty 2

## 2015-10-21 MED ORDER — PROPOFOL 10 MG/ML IV BOLUS
INTRAVENOUS | Status: AC
  Filled 2015-10-21: qty 20

## 2015-10-21 MED ORDER — MIDAZOLAM HCL 5 MG/5ML IJ SOLN
INTRAMUSCULAR | Status: DC | PRN
  Administered 2015-10-21: 2 mg via INTRAVENOUS

## 2015-10-21 MED ORDER — SUGAMMADEX SODIUM 200 MG/2ML IV SOLN
INTRAVENOUS | Status: AC
  Filled 2015-10-21: qty 2

## 2015-10-21 MED ORDER — EPHEDRINE SULFATE 50 MG/ML IJ SOLN
INTRAMUSCULAR | Status: DC | PRN
  Administered 2015-10-21 (×3): 10 mg via INTRAVENOUS
  Administered 2015-10-21: 5 mg via INTRAVENOUS

## 2015-10-21 MED ORDER — SUCCINYLCHOLINE CHLORIDE 200 MG/10ML IV SOSY
PREFILLED_SYRINGE | INTRAVENOUS | Status: AC
  Filled 2015-10-21: qty 10

## 2015-10-21 MED ORDER — THROMBIN 5000 UNITS EX SOLR
OROMUCOSAL | Status: DC | PRN
  Administered 2015-10-21: 13:00:00 via TOPICAL

## 2015-10-21 MED ORDER — ACETAMINOPHEN 650 MG RE SUPP: 650.0000 mg | RECTAL | Status: DC | PRN

## 2015-10-21 MED ORDER — LACTATED RINGERS IV SOLN
INTRAVENOUS | Status: DC
  Administered 2015-10-21 (×2): via INTRAVENOUS

## 2015-10-21 MED ORDER — PHENOL 1.4 % MT LIQD: 1.0000 | OROMUCOSAL | Status: DC | PRN

## 2015-10-21 MED ORDER — ONDANSETRON HCL 4 MG/2ML IJ SOLN
INTRAMUSCULAR | Status: AC
  Filled 2015-10-21: qty 2

## 2015-10-21 MED ORDER — FENTANYL CITRATE (PF) 250 MCG/5ML IJ SOLN
INTRAMUSCULAR | Status: DC | PRN
  Administered 2015-10-21 (×2): 100 ug via INTRAVENOUS

## 2015-10-21 MED ORDER — 0.9 % SODIUM CHLORIDE (POUR BTL) OPTIME
TOPICAL | Status: DC | PRN
  Administered 2015-10-21: 1000 mL

## 2015-10-21 MED ORDER — SODIUM CHLORIDE 0.9% FLUSH: 3.0000 mL | INTRAVENOUS | Status: DC | PRN

## 2015-10-21 MED ORDER — CHLORHEXIDINE GLUCONATE CLOTH 2 % EX PADS: 6.0000 | MEDICATED_PAD | Freq: Once | CUTANEOUS | Status: DC

## 2015-10-21 MED ORDER — MENTHOL 3 MG MT LOZG: 1.0000 | LOZENGE | OROMUCOSAL | Status: DC | PRN

## 2015-10-21 MED ORDER — ROCURONIUM BROMIDE 10 MG/ML (PF) SYRINGE
PREFILLED_SYRINGE | INTRAVENOUS | Status: AC
  Filled 2015-10-21: qty 20

## 2015-10-21 MED ORDER — INSULIN LISPRO 200 UNIT/ML ~~LOC~~ SOPN: 50.0000 [IU] | PEN_INJECTOR | Freq: Three times a day (TID) | SUBCUTANEOUS | Status: DC

## 2015-10-21 MED ORDER — METHOCARBAMOL 500 MG PO TABS
500.0000 mg | ORAL_TABLET | Freq: Four times a day (QID) | ORAL | Status: DC | PRN
  Administered 2015-10-21 – 2015-10-22 (×3): 500 mg via ORAL
  Filled 2015-10-21 (×3): qty 1

## 2015-10-21 MED ORDER — FENTANYL CITRATE (PF) 100 MCG/2ML IJ SOLN
INTRAMUSCULAR | Status: AC
  Administered 2015-10-21: 50 ug via INTRAVENOUS
  Filled 2015-10-21: qty 2

## 2015-10-21 MED ORDER — BUPROPION HCL ER (SR) 150 MG PO TB12
150.0000 mg | ORAL_TABLET | Freq: Two times a day (BID) | ORAL | Status: DC
  Administered 2015-10-21 (×2): 150 mg via ORAL
  Filled 2015-10-21 (×2): qty 1

## 2015-10-21 MED ORDER — SENNA 8.6 MG PO TABS
1.0000 | ORAL_TABLET | Freq: Two times a day (BID) | ORAL | Status: DC
  Administered 2015-10-21 (×2): 8.6 mg via ORAL
  Filled 2015-10-21 (×2): qty 1

## 2015-10-21 MED ORDER — ONDANSETRON HCL 4 MG/2ML IJ SOLN
INTRAMUSCULAR | Status: DC | PRN
  Administered 2015-10-21: 4 mg via INTRAVENOUS

## 2015-10-21 MED ORDER — ALPRAZOLAM 0.5 MG PO TABS
1.0000 mg | ORAL_TABLET | Freq: Two times a day (BID) | ORAL | Status: DC | PRN
  Filled 2015-10-21: qty 2

## 2015-10-21 MED ORDER — GABAPENTIN 600 MG PO TABS
600.0000 mg | ORAL_TABLET | Freq: Every day | ORAL | Status: DC
Start: 2015-10-21 — End: 2015-10-22
  Administered 2015-10-21: 600 mg via ORAL
  Filled 2015-10-21: qty 1

## 2015-10-21 MED ORDER — NITROGLYCERIN 0.4 MG SL SUBL: 0.4000 mg | SUBLINGUAL_TABLET | SUBLINGUAL | Status: DC | PRN

## 2015-10-21 MED ORDER — ACETAMINOPHEN 325 MG PO TABS: 650.0000 mg | ORAL_TABLET | ORAL | Status: DC | PRN

## 2015-10-21 MED ORDER — FENTANYL CITRATE (PF) 100 MCG/2ML IJ SOLN
25.0000 ug | INTRAMUSCULAR | Status: DC | PRN
  Administered 2015-10-21: 25 ug via INTRAVENOUS
  Administered 2015-10-21 (×2): 50 ug via INTRAVENOUS
  Administered 2015-10-21: 25 ug via INTRAVENOUS

## 2015-10-21 MED ORDER — LISINOPRIL 5 MG PO TABS
5.0000 mg | ORAL_TABLET | Freq: Every evening | ORAL | Status: DC
  Administered 2015-10-21: 5 mg via ORAL
  Filled 2015-10-21: qty 1

## 2015-10-21 MED ORDER — BUPIVACAINE HCL (PF) 0.5 % IJ SOLN
INTRAMUSCULAR | Status: DC | PRN
  Administered 2015-10-21: 7 mL

## 2015-10-21 MED ORDER — LIDOCAINE HCL (CARDIAC) 20 MG/ML IV SOLN
INTRAVENOUS | Status: DC | PRN
  Administered 2015-10-21: 100 mg via INTRATRACHEAL

## 2015-10-21 MED ORDER — PROPOFOL 10 MG/ML IV BOLUS
INTRAVENOUS | Status: DC | PRN
  Administered 2015-10-21: 200 mg via INTRAVENOUS

## 2015-10-21 MED ORDER — PROMETHAZINE HCL 25 MG/ML IJ SOLN: 6.2500 mg | INTRAMUSCULAR | Status: DC | PRN

## 2015-10-21 MED ORDER — POLYETHYLENE GLYCOL 3350 17 G PO PACK: 17.0000 g | PACK | Freq: Every day | ORAL | Status: DC | PRN

## 2015-10-21 MED ORDER — ROCURONIUM BROMIDE 100 MG/10ML IV SOLN
INTRAVENOUS | Status: DC | PRN
  Administered 2015-10-21: 50 mg via INTRAVENOUS

## 2015-10-21 MED ORDER — FENTANYL CITRATE (PF) 100 MCG/2ML IJ SOLN
INTRAMUSCULAR | Status: AC
  Administered 2015-10-21: 25 ug via INTRAVENOUS
  Filled 2015-10-21: qty 2

## 2015-10-21 MED ORDER — FENTANYL CITRATE (PF) 100 MCG/2ML IJ SOLN
INTRAMUSCULAR | Status: AC
  Filled 2015-10-21: qty 4

## 2015-10-21 MED ORDER — DIGOXIN 250 MCG PO TABS
0.2500 mg | ORAL_TABLET | Freq: Every day | ORAL | Status: DC
  Administered 2015-10-21: 0.25 mg via ORAL
  Filled 2015-10-21 (×2): qty 1

## 2015-10-21 MED ORDER — LIDOCAINE-EPINEPHRINE 1 %-1:100000 IJ SOLN
INTRAMUSCULAR | Status: DC | PRN
  Administered 2015-10-21: 7 mL

## 2015-10-21 MED ORDER — PHENYLEPHRINE 40 MCG/ML (10ML) SYRINGE FOR IV PUSH (FOR BLOOD PRESSURE SUPPORT)
PREFILLED_SYRINGE | INTRAVENOUS | Status: AC
  Filled 2015-10-21: qty 20

## 2015-10-21 MED ORDER — SODIUM CHLORIDE 0.9% FLUSH
3.0000 mL | Freq: Two times a day (BID) | INTRAVENOUS | Status: DC
  Administered 2015-10-21: 3 mL via INTRAVENOUS

## 2015-10-21 MED ORDER — SODIUM CHLORIDE 0.9 % IV SOLN: 250.0000 mL | INTRAVENOUS | Status: DC

## 2015-10-21 MED ORDER — DOCUSATE SODIUM 100 MG PO CAPS
100.0000 mg | ORAL_CAPSULE | Freq: Two times a day (BID) | ORAL | Status: DC
  Administered 2015-10-21 (×2): 100 mg via ORAL
  Filled 2015-10-21 (×2): qty 1

## 2015-10-21 MED ORDER — MORPHINE SULFATE (PF) 2 MG/ML IV SOLN
1.0000 mg | INTRAVENOUS | Status: DC | PRN
  Administered 2015-10-21 – 2015-10-22 (×2): 4 mg via INTRAVENOUS
  Filled 2015-10-21 (×3): qty 2

## 2015-10-21 MED ORDER — OXYCODONE-ACETAMINOPHEN 5-325 MG PO TABS
1.0000 | ORAL_TABLET | ORAL | Status: DC | PRN
  Administered 2015-10-21 – 2015-10-22 (×3): 2 via ORAL
  Filled 2015-10-21 (×3): qty 2

## 2015-10-21 MED ORDER — DEXAMETHASONE SODIUM PHOSPHATE 10 MG/ML IJ SOLN
INTRAMUSCULAR | Status: AC
  Filled 2015-10-21: qty 1

## 2015-10-21 MED ORDER — BACITRACIN 50000 UNITS IM SOLR
INTRAMUSCULAR | Status: DC | PRN
  Administered 2015-10-21: 13:00:00

## 2015-10-21 MED ORDER — INSULIN ASPART 100 UNIT/ML ~~LOC~~ SOLN: 50.0000 [IU] | Freq: Three times a day (TID) | SUBCUTANEOUS | Status: DC

## 2015-10-21 MED ORDER — MIDAZOLAM HCL 2 MG/2ML IJ SOLN
INTRAMUSCULAR | Status: AC
  Filled 2015-10-21: qty 2

## 2015-10-21 MED ORDER — SUGAMMADEX SODIUM 200 MG/2ML IV SOLN
INTRAVENOUS | Status: DC | PRN
  Administered 2015-10-21: 200 mg via INTRAVENOUS

## 2015-10-21 MED ORDER — ALUM & MAG HYDROXIDE-SIMETH 200-200-20 MG/5ML PO SUSP: 30.0000 mL | Freq: Four times a day (QID) | ORAL | Status: DC | PRN

## 2015-10-21 MED ORDER — INSULIN GLARGINE 100 UNIT/ML ~~LOC~~ SOLN
120.0000 [IU] | Freq: Every morning | SUBCUTANEOUS | Status: DC
  Filled 2015-10-21: qty 1.2

## 2015-10-21 MED ORDER — SUCCINYLCHOLINE CHLORIDE 20 MG/ML IJ SOLN
INTRAMUSCULAR | Status: DC | PRN
  Administered 2015-10-21: 100 mg via INTRAVENOUS

## 2015-10-21 MED ORDER — ONDANSETRON HCL 4 MG/2ML IJ SOLN
4.0000 mg | INTRAMUSCULAR | Status: DC | PRN
  Administered 2015-10-21 – 2015-10-22 (×3): 4 mg via INTRAVENOUS
  Filled 2015-10-21 (×3): qty 2

## 2015-10-21 SURGICAL SUPPLY — 48 items
BAG DECANTER FOR FLEXI CONT (MISCELLANEOUS) ×3 IMPLANT
BIT DRILL NEURO 2X3.1 SFT TUCH (MISCELLANEOUS) ×1 IMPLANT
BNDG GAUZE ELAST 4 BULKY (GAUZE/BANDAGES/DRESSINGS) IMPLANT
BONE EQUIVA 5CC (Bone Implant) ×3 IMPLANT
BUR BARREL STRAIGHT FLUTE 4.0 (BURR) ×3 IMPLANT
CANISTER SUCT 3000ML PPV (MISCELLANEOUS) ×3 IMPLANT
CAP LOCKING ALTA G2 TI (Cap) ×9 IMPLANT
DECANTER SPIKE VIAL GLASS SM (MISCELLANEOUS) ×3 IMPLANT
DERMABOND ADVANCED (GAUZE/BANDAGES/DRESSINGS) ×2
DERMABOND ADVANCED .7 DNX12 (GAUZE/BANDAGES/DRESSINGS) ×1 IMPLANT
DRAPE LAPAROTOMY 100X72 PEDS (DRAPES) ×3 IMPLANT
DRAPE MICROSCOPE LEICA (MISCELLANEOUS) IMPLANT
DRAPE POUCH INSTRU U-SHP 10X18 (DRAPES) ×3 IMPLANT
DRILL NEURO 2X3.1 SOFT TOUCH (MISCELLANEOUS) ×3
DURAPREP 6ML APPLICATOR 50/CS (WOUND CARE) ×3 IMPLANT
ELECT REM PT RETURN 9FT ADLT (ELECTROSURGICAL) ×3
ELECTRODE REM PT RTRN 9FT ADLT (ELECTROSURGICAL) ×1 IMPLANT
GAUZE SPONGE 4X4 16PLY XRAY LF (GAUZE/BANDAGES/DRESSINGS) IMPLANT
GLOVE BIO SURGEON STRL SZ7.5 (GLOVE) IMPLANT
GLOVE BIOGEL PI IND STRL 7.5 (GLOVE) IMPLANT
GLOVE BIOGEL PI IND STRL 8.5 (GLOVE) ×1 IMPLANT
GLOVE BIOGEL PI INDICATOR 7.5 (GLOVE)
GLOVE BIOGEL PI INDICATOR 8.5 (GLOVE) ×2
GLOVE ECLIPSE 8.5 STRL (GLOVE) ×3 IMPLANT
GLOVE ECLIPSE 9.0 STRL (GLOVE) ×3 IMPLANT
GOWN STRL REUS W/ TWL LRG LVL3 (GOWN DISPOSABLE) IMPLANT
GOWN STRL REUS W/ TWL XL LVL3 (GOWN DISPOSABLE) ×1 IMPLANT
GOWN STRL REUS W/TWL 2XL LVL3 (GOWN DISPOSABLE) ×3 IMPLANT
GOWN STRL REUS W/TWL LRG LVL3 (GOWN DISPOSABLE)
GOWN STRL REUS W/TWL XL LVL3 (GOWN DISPOSABLE) ×2
HALTER HD/CHIN CERV TRACTION D (MISCELLANEOUS) ×3 IMPLANT
HEMOSTAT POWDER KIT SURGIFOAM (HEMOSTASIS) ×3 IMPLANT
KIT BASIN OR (CUSTOM PROCEDURE TRAY) ×3 IMPLANT
NEEDLE HYPO 22GX1.5 SAFETY (NEEDLE) ×3 IMPLANT
NEEDLE SPNL 22GX3.5 QUINCKE BK (NEEDLE) ×3 IMPLANT
NS IRRIG 1000ML POUR BTL (IV SOLUTION) ×3 IMPLANT
PACK LAMINECTOMY NEURO (CUSTOM PROCEDURE TRAY) ×3 IMPLANT
PAD ARMBOARD 7.5X6 YLW CONV (MISCELLANEOUS) ×9 IMPLANT
PEEK ACDF 5DEG 14X17X7MM (Peek) ×3 IMPLANT
RUBBERBAND STERILE (MISCELLANEOUS) IMPLANT
SCREW S/D VAR TI 3.5X14MM G2 (Screw) ×6 IMPLANT
SCREW S/D VAR TI 3.5X16MM G3 (Screw) ×3 IMPLANT
SLEEVE SURGEON STRL (DRAPES) ×3 IMPLANT
SPONGE INTESTINAL PEANUT (DISPOSABLE) ×3 IMPLANT
SUT VIC AB 3-0 SH 8-18 (SUTURE) ×3 IMPLANT
TOWEL OR 17X24 6PK STRL BLUE (TOWEL DISPOSABLE) ×3 IMPLANT
TOWEL OR 17X26 10 PK STRL BLUE (TOWEL DISPOSABLE) ×3 IMPLANT
WATER STERILE IRR 1000ML POUR (IV SOLUTION) ×3 IMPLANT

## 2015-10-21 NOTE — Progress Notes (Signed)
Patient ID: Arthur Mata, male   DOB: September 19, 1955, 60 y.o.   MRN: [id number] vss Neuro intact Doing well

## 2015-10-21 NOTE — Op Note (Signed)
Date of surgery: 10/21/2015 Brief history: Patient is a 60 year old individual who's had previous right-sided cervical radiculopathy in the C8 distribution. He underwent posterior decompression and fusion using of the tracks technique. Did well for a period of time then developed left upper extremity radiculopathy in a C8 distribution. He has advancing spondylosis on that side now an anterior decompression at the C7-T1 levels being performed. He had previous fusion C5-C7.  Preoperative diagnosis: Cervical radiculopathy C8 left status post posterior fusion C7-T1.  Postoperative diagnosis: Same  Procedure: Anterior decompression C7-T1 using alt peek spacer measuring 14 x 17 x 7 mm a 5 lordosis and an altered caudal plate measuring 7 mm in length. Self drilling variable angle screws to 3.5 x 14 mm screws and one 3.5 x 16 mm screws. Surgeon: Kristeen Miss, MD Asst.: Deri Fuelling M.D.  Anesthesia: Gen. endotracheal  Estimated blood loss: 100 mL  Drains: None  Complications: None  Indications: Patient presents for decompression of left cervical C8 nerve root having had previous posterior decompression and posterior fusion at C7-T1.  Procedure: The patient was brought to the operating room on a stretcher was placed on the table in the supine position. General endotracheal anesthesia was induced the anterior border of the neck was inspected prepped with alcohol and DuraPrep and draped in a sterile fashion. Transverse incision was created in the anterior border of the neck and carried down through the platysma. The plane between the sternocleidomastoid and strap muscles dissected bluntly until the first prevertebral space was reached. This space was identified as C7-T1 by palpating from the inferior aspect of the previously placed plate. The dissection was then completed undermining longus coli muscle to allow placement of a self-retaining Caspar retractor. The anterior longitudinal ligament was then  opened with a #15 blade and ventral osteophytes removed using a Scientific laboratory technician. The disc space was evacuated of substantial quantity of the degenerated and desiccated disc material. Region of the posterior longitudinal ligament was reached and a self-retaining disc space spreader was placed into the interspace. A high-speed drill was used to remove bony osteophytes from the inferior margin of the superior endplate and also to remove overgrown lateral osteophytes and the uncinate processes. The nerve roots were decompressed using a 1 and 2 mm Kerrison punch. Hemostasis was established using small pledgets of Gelfoam soaked in thrombin and cottonoid patties. These were later irrigated away. The endplates were drilled smoothed with a 4 mm barrel bit.  An alt peek spacer measuring 14 x 17 x 7 mm with 5 of lordosis and an altered caudal plate was fitted together and filled with equivocal bone. This was placed into the interspace. 23.5 x 14 mm screws were placed into the T1 vertebral body and one 3.5 x 16 mm screw was placed into the C7 vertebral body. The stasis was then checked and when hemostasis was secured the platysma was closed with 3-0 Vicryl and 3-0 Vicryl is used to close subcuticular skin.

## 2015-10-21 NOTE — Transfer of Care (Signed)
Immediate Anesthesia Transfer of Care Note  Patient: Arthur Mata  Procedure(s) Performed: Procedure(s) with comments: Cervical seven-Thoracic one ANTERIOR CERVICAL DECOMPRESSION/DISKECTOMY/FUSION WITH ALTA PLATE (N/A) - [id number] ANTERIOR CERVICAL DECOMPRESSION/DISKECTOMY/FUSION WITH ALTA PLATE  Patient Location: PACU  Anesthesia Type:General  Level of Consciousness: awake, alert  and oriented  Airway & Oxygen Therapy: Patient Spontanous Breathing and Patient connected to nasal cannula oxygen  Post-op Assessment: Report given to RN, Post -op Vital signs reviewed and stable and Patient moving all extremities X 4  Post vital signs: Reviewed and stable  Last Vitals:  Vitals:   10/21/15 0811 10/21/15 1350  BP: 118/62   Pulse: (!) 52   Resp: 20   Temp: 36.5 C 36.5 C    Last Pain:  Vitals:   10/21/15 1350  TempSrc:   PainSc: 5       Patients Stated Pain Goal: 6 ([id number])  Complications: No apparent anesthesia complications

## 2015-10-21 NOTE — Anesthesia Procedure Notes (Signed)
Procedure Name: Intubation Date/Time: 10/21/2015 12:07 PM Performed by: Mariea Clonts Pre-anesthesia Checklist: Patient identified, Timeout performed, Emergency Drugs available, Suction available and Patient being monitored Patient Re-evaluated:Patient Re-evaluated prior to inductionOxygen Delivery Method: Circle system utilized Preoxygenation: Pre-oxygenation with 100% oxygen Intubation Type: IV induction Ventilation: Mask ventilation without difficulty and Oral airway inserted - appropriate to patient size Laryngoscope Size: Glidescope Grade View: Grade I Tube type: Oral Tube size: 8.0 mm Number of attempts: 1 Airway Equipment and Method: Video-laryngoscopy Placement Confirmation: ETT inserted through vocal cords under direct vision,  positive ETCO2 and breath sounds checked- equal and bilateral Tube secured with: Tape

## 2015-10-21 NOTE — Anesthesia Preprocedure Evaluation (Signed)
Anesthesia Evaluation  Patient identified by MRN, date of birth, ID band Patient awake    Reviewed: Allergy & Precautions, NPO status , Patient's Chart, lab work & pertinent test results, reviewed documented beta blocker date and time   Airway Mallampati: III  TM Distance: >3 FB Neck ROM: Full    Dental  (+) Partial Upper, Dental Advisory Given   Pulmonary COPD, Current Smoker, former smoker,    breath sounds clear to auscultation       Cardiovascular hypertension, Pt. on medications and Pt. on home beta blockers + angina with exertion + CAD   Rhythm:Regular Rate:Normal  Echo 10/03/15:  - Left ventricle: The cavity size was normal. There was mild concentric hypertrophy. Systolic function was normal. The estimated ejection fraction was in the range of 60% to 65%. Wall motion was normal; there were no regional wall motion abnormalities. Left ventricular diastolic function parameters were normal. - Mitral valve: Calcified annulus.  Nuclear stress test 09/25/15:   stress EF: 69%.  There was no ST segment deviation noted during stress.  The study is normal.  This is a low risk study.  The left ventricular ejection fraction is hyperdynamic (>65%). Normal resting and stress perfusion. No ischemia or infarction EF 69%  Cardiac cath 11/17/12 ([place], [place]): - LM patent - mid LAD 30% lesion - LCx prox 40% lesion - RCA: mid and distal 20-30% lesion - EF 65%   Neuro/Psych  Headaches, negative psych ROS   GI/Hepatic negative GI ROS, Neg liver ROS, GERD  Medicated,  Endo/Other  diabetes, Insulin Dependent  Renal/GU negative Renal ROS  negative genitourinary   Musculoskeletal negative musculoskeletal ROS (+) Arthritis ,   Abdominal   Peds negative pediatric ROS (+)  Hematology negative hematology ROS (+)   Anesthesia Other Findings - HLD   Reproductive/Obstetrics negative OB ROS                              Lab Results  Component Value Date   WBC 11.7 (H) 10/11/2015   HGB 14.6 10/11/2015   HCT 43.5 10/11/2015   MCV 90.4 10/11/2015   PLT 222 10/11/2015   Lab Results  Component Value Date   CREATININE 1.08 10/11/2015   BUN 14 10/11/2015   NA 133 (L) 10/11/2015   K 4.6 10/11/2015   CL 102 10/11/2015   CO2 23 10/11/2015   No results found for: INR, PROTIME  03/2015 EKG: sinus bradycardia.   Anesthesia Physical  Anesthesia Plan  ASA: III  Anesthesia Plan: General   Post-op Pain Management:    Induction: Intravenous  Airway Management Planned: Oral ETT and Video Laryngoscope Planned  Additional Equipment:   Intra-op Plan:   Post-operative Plan: Extubation in OR  Informed Consent: I have reviewed the patients History and Physical, chart, labs and discussed the procedure including the risks, benefits and alternatives for the proposed anesthesia with the patient or authorized representative who has indicated his/her understanding and acceptance.   Dental advisory given  Plan Discussed with: CRNA  Anesthesia Plan Comments:         Anesthesia Quick Evaluation

## 2015-10-21 NOTE — Progress Notes (Signed)
Assessment done at 1120

## 2015-10-21 NOTE — H&P (Signed)
Arthur Mata is an 60 y.o. male.   Chief Complaint: Neck shoulder and left arm pain with weakness in the grip HPI: Arthur Mata is a 60 year old individual who's had problems with the C7-T1 level in the past. He's had all right-sided decompression for a significant C8 radiculopathy about 4 months ago. This got better substantially buddies come plane now of the left upper extremity pain and weakness in the grip. This is gotten progressively worse. He has evidence of significant spondylosis with narrowing of the foramen on the left side at the C7-T1 level. We did a posterior decompression and fusion on the right side some months ago. The left side also had a deep tracks procedure performed. However he is now developed a significant left-sided cervical radiculopathy.  Past Medical History:  Diagnosis Date  . Angina pectoris ([place])   . Arthritis   . Bone spur of left foot   . Chest pain   . COPD (chronic obstructive pulmonary disease) ([place])   . Coronary artery disease    no stents - some blockage (35% in "widowmaker) - 2014  . Diabetes mellitus without complication ([place])    type 2  . Dysrhythmia    goes fast at times  . GERD (gastroesophageal reflux disease)   . History of bronchitis   . History of hiatal hernia   . History of pneumonia   . Hyperlipidemia   . Hypertension   . Measles   . Mumps   . Near syncope   . Neuropathy ([place])   . Obesity   . Palpitations   . Pneumonia     Past Surgical History:  Procedure Laterality Date  . ANTERIOR CERVICAL DECOMP/DISCECTOMY FUSION N/A 04/26/2015   Procedure: Anterior Cervical Discectomy and Fusion - Cervical five-Cervical six with alta interbody;  Surgeon: Kristeen Miss, MD;  Location: [facility];  Service: Neurosurgery;  Laterality: N/A;  . ANTERIOR CERVICAL DISCECTOMY    . CARDIAC CATHETERIZATION    . CARPAL TUNNEL RELEASE Right 07/19/2015   Procedure: Right Carpal Tunnel Release;  Surgeon: Kristeen Miss, MD;  Location: [facility];  Service:  Neurosurgery;  Laterality: Right;  Right Carpal tunnel release  . COLONOSCOPY    . EYE SURGERY Bilateral    cataracts  . FOOT SURGERY Left   . POSTERIOR CERVICAL FUSION/FORAMINOTOMY N/A 07/12/2015   Procedure: Cervical five-six Cervical seven-Thoracic one Posterior cervical fusion with instrumention;  Surgeon: Kristeen Miss, MD;  Location: [facility];  Service: Neurosurgery;  Laterality: N/A;  C5-6 C7-T1 Posterior cervical fusion with lateral mass fixation  . SPINE SURGERY  03/21/13   Lumbar fusion  . ULNAR NERVE TRANSPOSITION Right 07/19/2015   Procedure: Right Ulnar nerve release ;  Surgeon: Kristeen Miss, MD;  Location: [facility];  Service: Neurosurgery;  Laterality: Right;  Right Ulnar nerve release     Family History  Problem Relation Age of Onset  . Diabetes Mother   . Heart attack Mother   . Diabetes Father   . Heart disease Father   . Heart attack Father   . Diabetes Brother   . Hyperlipidemia Other   . Hypertension Other    Social History:  reports that he quit smoking about 6 months ago. His smoking use included Cigarettes. He started smoking about 19 months ago. He has a 6.25 pack-year smoking history. He has never used smokeless tobacco. He reports that he drinks alcohol. He reports that he does not use drugs.  Allergies:  Allergies  Allergen Reactions  .  Prednisone Other (See Comments)    Run sugar over 600 causes hospitalization    Medications Prior to Admission  Medication Sig Dispense Refill  . alprazolam (XANAX) 2 MG tablet Take 1-2 mg by mouth 2 (two) times daily as needed for anxiety.  5  . aspirin 81 MG tablet Take 81 mg by mouth every evening.     Marland Kitchen atorvastatin (LIPITOR) 40 MG tablet Take 40 mg by mouth every evening.   3  . buPROPion (WELLBUTRIN SR) 150 MG 12 hr tablet Take 150 mg by mouth 2 (two) times daily.   10  . digoxin (LANOXIN) 0.25 MG tablet Take 0.25 mg by mouth daily.    Marland Kitchen esomeprazole (NEXIUM) 40 MG capsule Take 1 capsule (40 mg total) by  mouth daily. 30 capsule 5  . gabapentin (NEURONTIN) 600 MG tablet Take 600 mg by mouth at bedtime.   3  . Insulin Lispro, Human, (HUMALOG KWIKPEN) 200 UNIT/ML SOPN Inject 130 Units into the skin 3 (three) times daily. Inject 50 units at breakfast, 30 units at lunch and 50 units at dinner (Patient taking differently: Inject 50-80 Units into the skin 3 (three) times daily. Inject 80 units at breakfast, 50 units at lunch and 80 units at dinner) 45 mL 3  . lisinopril (PRINIVIL,ZESTRIL) 5 MG tablet Take 5 mg by mouth every evening.   3  . metoprolol succinate (TOPROL-XL) 25 MG 24 hr tablet Take 25 mg by mouth at bedtime.     . nitroGLYCERIN (NITROSTAT) 0.4 MG SL tablet Place 1 tablet (0.4 mg total) under the tongue every 5 (five) minutes as needed for chest pain. 90 tablet 3  . TOUJEO SOLOSTAR 300 UNIT/ML SOPN INJECT 120 UNITS INTO THE SKIN DAILY BEFORE BREAKFAST. 13.5 mL 3  . VICTOZA 18 MG/3ML SOPN Inject 1.8 mg into the skin daily.   3    Results for orders placed or performed during the hospital encounter of 10/21/15 (from the past 48 hour(s))  Glucose, capillary     Status: Abnormal   Collection Time: 10/21/15  8:18 AM  Result Value Ref Range   Glucose-Capillary 144 (H) 65 - 99 mg/dL   Comment 1 Notify RN    Comment 2 Document in Chart   Type and screen     Status: None   Collection Time: 10/21/15  8:27 AM  Result Value Ref Range   ABO/RH(D) A NEG    Antibody Screen NEG    Sample Expiration 10/24/2015   Glucose, capillary     Status: Abnormal   Collection Time: 10/21/15 10:36 AM  Result Value Ref Range   Glucose-Capillary 123 (H) 65 - 99 mg/dL   No results found.  Review of Systems  HENT: Negative.   Eyes: Negative.   Respiratory: Negative.   Cardiovascular: Negative.   Gastrointestinal: Negative.   Genitourinary: Negative.   Musculoskeletal: Positive for neck pain.  Neurological: Positive for weakness.       Tingling numbness and pain in the left C8 distribution   Psychiatric/Behavioral: Negative.     Blood pressure 118/62, pulse (!) 52, temperature 97.7 F (36.5 C), temperature source Oral, resp. rate 20, weight 104.3 kg (230 lb), SpO2 98 %. Physical Exam  Constitutional: He is oriented to person, place, and time. He appears well-developed and well-nourished.  Eyes: Conjunctivae are normal. Pupils are equal, round, and reactive to light.  Neck:  Limited range of motion of the neck  Cardiovascular: Normal rate and regular rhythm.   Respiratory: Effort normal  and breath sounds normal.  GI: Soft. Bowel sounds are normal.  Musculoskeletal:  Limited range of motion of the neck turning 45 left and right flexing and extending 50% of normal. Positive Spurling maneuver on left.  Neurological: He is alert and oriented to person, place, and time.  Absent biceps and triceps reflex. Decreased grip strength on left with mild intrinsic weakness on left  Skin: Skin is warm and dry.  Psychiatric: He has a normal mood and affect. His behavior is normal. Judgment and thought content normal.     Assessment/Plan C8 radiculopathy on the left., Chronic.  Posterior decompression scratch that anterior decompression arthrodesis C7-T1.  [name], MD 10/21/2015, 11:43 AM

## 2015-10-21 NOTE — Progress Notes (Signed)
Orthopedic Tech Progress Note Patient Details:  Arthur Mata 1955/10/22 [id number]  Ortho Devices Type of Ortho Device: Soft collar Ortho Device/Splint Location: neck Ortho Device/Splint Interventions: Application   [name] 10/21/2015, 2:53 PM

## 2015-10-21 NOTE — Anesthesia Postprocedure Evaluation (Signed)
Anesthesia Post Note  Patient: Arthur Mata  Procedure(s) Performed: Procedure(s) (LRB): Cervical seven-Thoracic one ANTERIOR CERVICAL DECOMPRESSION/DISKECTOMY/FUSION WITH ALTA PLATE (N/A)  Patient location during evaluation: PACU Anesthesia Type: General Level of consciousness: awake and alert Pain management: pain level controlled Vital Signs Assessment: post-procedure vital signs reviewed and stable Respiratory status: spontaneous breathing, nonlabored ventilation, respiratory function stable and patient connected to nasal cannula oxygen Cardiovascular status: blood pressure returned to baseline and stable Postop Assessment: no signs of nausea or vomiting Anesthetic complications: no    Last Vitals:  Vitals:   10/21/15 1419 10/21/15 1434  BP: 133/75 (!) 150/61  Pulse: 67 69  Resp: 14 12  Temp:      Last Pain:  Vitals:   10/21/15 1420  TempSrc:   PainSc: 10-Worst pain ever                 Reginal Lutes

## 2015-10-22 ENCOUNTER — Encounter (HOSPITAL_COMMUNITY): Payer: Self-pay | Admitting: Neurological Surgery

## 2015-10-22 ENCOUNTER — Ambulatory Visit: Payer: Medicare Other | Admitting: Endocrinology

## 2015-10-22 DIAGNOSIS — M5013 Cervical disc disorder with radiculopathy, cervicothoracic region: Secondary | ICD-10-CM | POA: Diagnosis not present

## 2015-10-22 DIAGNOSIS — E785 Hyperlipidemia, unspecified: Secondary | ICD-10-CM | POA: Diagnosis not present

## 2015-10-22 DIAGNOSIS — J449 Chronic obstructive pulmonary disease, unspecified: Secondary | ICD-10-CM | POA: Diagnosis not present

## 2015-10-22 DIAGNOSIS — Z981 Arthrodesis status: Secondary | ICD-10-CM | POA: Diagnosis not present

## 2015-10-22 DIAGNOSIS — K219 Gastro-esophageal reflux disease without esophagitis: Secondary | ICD-10-CM | POA: Diagnosis not present

## 2015-10-22 DIAGNOSIS — M4723 Other spondylosis with radiculopathy, cervicothoracic region: Secondary | ICD-10-CM | POA: Diagnosis not present

## 2015-10-22 DIAGNOSIS — E1142 Type 2 diabetes mellitus with diabetic polyneuropathy: Secondary | ICD-10-CM | POA: Diagnosis not present

## 2015-10-22 DIAGNOSIS — I10 Essential (primary) hypertension: Secondary | ICD-10-CM | POA: Diagnosis not present

## 2015-10-22 DIAGNOSIS — I2510 Atherosclerotic heart disease of native coronary artery without angina pectoris: Secondary | ICD-10-CM | POA: Diagnosis not present

## 2015-10-22 LAB — GLUCOSE, CAPILLARY
GLUCOSE-CAPILLARY: 173 mg/dL — AB (ref 65–99)
Glucose-Capillary: 170 mg/dL — ABNORMAL HIGH (ref 65–99)

## 2015-10-22 MED ORDER — OXYCODONE-ACETAMINOPHEN 5-325 MG PO TABS: 1.0000 | ORAL_TABLET | ORAL | 0 refills | Status: DC | PRN

## 2015-10-22 MED ORDER — DIAZEPAM 5 MG PO TABS: 5.0000 mg | ORAL_TABLET | Freq: Four times a day (QID) | ORAL | 0 refills | Status: DC | PRN

## 2015-10-22 NOTE — Discharge Instructions (Signed)

## 2015-10-22 NOTE — Evaluation (Signed)
Physical Therapy Evaluation/ Discharge Patient Details Name: Arthur Mata MRN: [id number] DOB: 03/04/1955 Today's Date: 10/22/2015   History of Present Illness  60 yo admitted for C7-T1 posterior fusion. PMhx: C5-6 fusion, lumbar fusion, cAD, COPD, arthritis, HLD, HTN, DM  Clinical Impression  Pt very pleasant and moving well. Pt educated for precautions, transfers, dressing, and functional mobility with handout provided and wife present. Pt educated not to lift grandchildren or other objects. Pt limited by pain but all other mobility mod I level without further therapy needs at this time as all education completed and pt safe for D/c.     Follow Up Recommendations No PT follow up    Equipment Recommendations  None recommended by PT    Recommendations for Other Services       Precautions / Restrictions Precautions Precautions: Cervical Required Braces or Orthoses: Cervical Brace Cervical Brace: Soft collar;At all times Restrictions Weight Bearing Restrictions: No      Mobility  Bed Mobility Overal bed mobility: Needs Assistance Bed Mobility: Rolling;Sidelying to Sit;Sit to Sidelying Rolling: Supervision Sidelying to sit: Supervision     Sit to sidelying: Supervision General bed mobility comments: cues for sequence and precautions  Transfers Overall transfer level: Modified independent                  Ambulation/Gait Ambulation/Gait assistance: Independent Ambulation Distance (Feet): 400 Feet   Gait Pattern/deviations: WFL(Within Functional Limits)   Gait velocity interpretation: at or above normal speed for age/gender    Stairs            Wheelchair Mobility    Modified Rankin (Stroke Patients Only)       Balance                                             Pertinent Vitals/Pain Pain Assessment: 0-10 Pain Score: 7  Pain Location: neck Pain Descriptors / Indicators: Sore Pain Intervention(s): Limited activity within  patient's tolerance;Repositioned    Home Living Family/patient expects to be discharged to:: Private residence Living Arrangements: Spouse/significant other Available Help at Discharge: Family;Available 24 hours/day Type of Home: House Home Access: Level entry     Home Layout: One level Home Equipment: Walker - 2 wheels;Cane - single point;Shower seat - built in;Grab bars - tub/shower;Hand held shower head      Prior Function Level of Independence: Independent               Hand Dominance   Dominant Hand: Right    Extremity/Trunk Assessment   Upper Extremity Assessment: Overall WFL for tasks assessed           Lower Extremity Assessment: Overall WFL for tasks assessed      Cervical / Trunk Assessment: Normal  Communication   Communication: No difficulties  Cognition Arousal/Alertness: Awake/alert Behavior During Therapy: WFL for tasks assessed/performed Overall Cognitive Status: Within Functional Limits for tasks assessed                      General Comments      Exercises        Assessment/Plan    PT Assessment Patent does not need any further PT services  PT Diagnosis Acute pain   PT Problem List    PT Treatment Interventions     PT Goals (Current goals can be found in the Care Plan section)  Acute Rehab PT Goals PT Goal Formulation: All assessment and education complete, DC therapy    Frequency     Barriers to discharge        Co-evaluation               End of Session Equipment Utilized During Treatment: Cervical collar Activity Tolerance: Patient tolerated treatment well Patient left: in bed;with call bell/phone within reach;with family/visitor present Nurse Communication: Mobility status;Precautions    Functional Assessment Tool Used: clinical judgement Functional Limitation: Mobility: Walking and moving around Mobility: Walking and Moving Around Current Status [id number]): At least 1 percent but less than 20 percent  impaired, limited or restricted Mobility: Walking and Moving Around Goal Status [phone]): At least 1 percent but less than 20 percent impaired, limited or restricted Mobility: Walking and Moving Around Discharge Status [phone]): At least 1 percent but less than 20 percent impaired, limited or restricted    Time: 0737-0748 PT Time Calculation (min) (ACUTE ONLY): 11 min   Charges:   PT Evaluation $PT Eval Low Complexity: 1 Procedure     PT G Codes:   PT G-Codes **NOT FOR INPATIENT CLASS** Functional Assessment Tool Used: clinical judgement Functional Limitation: Mobility: Walking and moving around Mobility: Walking and Moving Around Current Status [id number]): At least 1 percent but less than 20 percent impaired, limited or restricted Mobility: Walking and Moving Around Goal Status [phone]): At least 1 percent but less than 20 percent impaired, limited or restricted Mobility: Walking and Moving Around Discharge Status [phone]): At least 1 percent but less than 20 percent impaired, limited or restricted    Melford Aase 10/22/2015, 9:32 AM Elwyn Reach, [place]

## 2015-10-22 NOTE — Progress Notes (Signed)
Pt doing well. Pt and wife given D/C instructions with Rx's, verbal understanding was provided. Pt's incision is open to air and is clean and dry. Pt's IV was removed prior to D/C. Pt D/C'd home via wheelchair @ 0825 per MD order. Pt is stable @ D/C and has no other needs at this time. Holli Humbles, RN

## 2015-10-22 NOTE — Discharge Summary (Signed)
Physician Discharge Summary  Patient ID: STEDMON HIBDON MRN: [id number] DOB/AGE: 01-Dec-1955 60 y.o.  Admit date: 10/21/2015 Discharge date: 10/22/2015  Admission Diagnoses:C7-T1 herniated nucleus pulposus with left cervical radiculopathy status post decompression arthrodesis C4-C7  Discharge Diagnoses: C7-T1 herniated nucleus pulposus with left cervical radiculopathy. Status post decompression arthrodesis C4-C7. Status post posterior fusion C7-T1. Active Problems:   Cervical spondylosis with radiculopathy   Discharged Condition: good  Hospital Course: Patient was admitted to undergo anterior decompression surgery with fixation at C7-T1. He tolerated this well.  Consults: None  Significant Diagnostic Studies: None  Treatments: Anterior cervical decompression arthrodesis C7-T1  Discharge Exam: Blood pressure (!) 159/80, pulse 72, temperature 98.5 F (36.9 C), temperature source Oral, resp. rate 20, weight 104.3 kg (230 lb), SpO2 94 %. Incision is clean and dry motor function is intact in lower extremities and upper extremities station and gait are normal  Disposition: 01-Home or Self Care  Discharge Instructions    Call MD for:  redness, tenderness, or signs of infection (pain, swelling, redness, odor or green/yellow discharge around incision site)    Complete by:  As directed   Call MD for:  severe uncontrolled pain    Complete by:  As directed   Call MD for:  temperature >100.4    Complete by:  As directed   Diet - low sodium heart healthy    Complete by:  As directed   Discharge instructions    Complete by:  As directed   Okay to shower. Do not apply salves or appointments to incision. No heavy lifting with the upper extremities greater than 15 pounds. May resume driving when not requiring pain medication and patient feels comfortable with doing so.   Increase activity slowly    Complete by:  As directed       Medication List    TAKE these medications   alprazolam 2 MG  tablet Commonly known as:  XANAX Take 1-2 mg by mouth 2 (two) times daily as needed for anxiety.   aspirin 81 MG tablet Take 81 mg by mouth every evening.   atorvastatin 40 MG tablet Commonly known as:  LIPITOR Take 40 mg by mouth every evening.   buPROPion 150 MG 12 hr tablet Commonly known as:  WELLBUTRIN SR Take 150 mg by mouth 2 (two) times daily.   diazepam 5 MG tablet Commonly known as:  VALIUM Take 1 tablet (5 mg total) by mouth every 6 (six) hours as needed for muscle spasms.   digoxin 0.25 MG tablet Commonly known as:  LANOXIN Take 0.25 mg by mouth daily.   esomeprazole 40 MG capsule Commonly known as:  NEXIUM Take 1 capsule (40 mg total) by mouth daily.   gabapentin 600 MG tablet Commonly known as:  NEURONTIN Take 600 mg by mouth at bedtime.   Insulin Lispro 200 UNIT/ML Sopn Commonly known as:  HUMALOG KWIKPEN Inject 130 Units into the skin 3 (three) times daily. Inject 50 units at breakfast, 30 units at lunch and 50 units at dinner What changed:  how much to take  additional instructions   lisinopril 5 MG tablet Commonly known as:  PRINIVIL,ZESTRIL Take 5 mg by mouth every evening.   metoprolol succinate 25 MG 24 hr tablet Commonly known as:  TOPROL-XL Take 25 mg by mouth at bedtime.   nitroGLYCERIN 0.4 MG SL tablet Commonly known as:  NITROSTAT Place 1 tablet (0.4 mg total) under the tongue every 5 (five) minutes as needed for chest pain.  oxyCODONE-acetaminophen 5-325 MG tablet Commonly known as:  ROXICET Take 1-2 tablets by mouth every 4 (four) hours as needed for severe pain.   TOUJEO SOLOSTAR 300 UNIT/ML Sopn Generic drug:  Insulin Glargine INJECT 120 UNITS INTO THE SKIN DAILY BEFORE BREAKFAST.   VICTOZA 18 MG/3ML Sopn Generic drug:  Liraglutide Inject 1.8 mg into the skin daily.        SignedEarleen Newport 10/22/2015, 8:05 AM

## 2015-11-01 MED FILL — ALPRAZolam 2 MG TABS: 2 | 30 days supply | Qty: 90 | Fill #1

## 2015-11-01 MED FILL — BD PEN NDL NANO 32GX5/32: 32G X 4 MM | 25 days supply | Qty: 100 | Fill #4

## 2015-11-01 MED FILL — BD PEN NDL NANO 32GX5/32": 32G X 4 MM | 25 days supply | Qty: 100 | Fill #4

## 2015-11-01 MED FILL — DIGOXIN 250 MCG TABLET: 250 | 90 days supply | Qty: 90 | Fill #2

## 2015-11-07 DIAGNOSIS — Z6832 Body mass index (BMI) 32.0-32.9, adult: Secondary | ICD-10-CM | POA: Diagnosis not present

## 2015-11-07 DIAGNOSIS — M5412 Radiculopathy, cervical region: Secondary | ICD-10-CM | POA: Diagnosis not present

## 2015-11-18 MED FILL — TOUJEO SOLOSTAR 300 UNITS/M: 300 | 23 days supply | Qty: 9 | Fill #1

## 2015-11-20 ENCOUNTER — Other Ambulatory Visit: Payer: Self-pay | Admitting: *Deleted

## 2015-11-20 ENCOUNTER — Ambulatory Visit (INDEPENDENT_AMBULATORY_CARE_PROVIDER_SITE_OTHER): Payer: 59 | Admitting: Endocrinology

## 2015-11-20 ENCOUNTER — Encounter: Payer: Self-pay | Admitting: Endocrinology

## 2015-11-20 VITALS — BP 140/80 | HR 58 | Temp 98.1°F | Resp 16 | Ht 71.0 in | Wt 231.4 lb

## 2015-11-20 DIAGNOSIS — Z794 Long term (current) use of insulin: Secondary | ICD-10-CM | POA: Diagnosis not present

## 2015-11-20 DIAGNOSIS — E1165 Type 2 diabetes mellitus with hyperglycemia: Secondary | ICD-10-CM

## 2015-11-20 DIAGNOSIS — I25119 Atherosclerotic heart disease of native coronary artery with unspecified angina pectoris: Secondary | ICD-10-CM

## 2015-11-20 MED ORDER — PIOGLITAZONE HCL 30 MG PO TABS: 30.0000 mg | ORAL_TABLET | Freq: Every day | ORAL | 3 refills | Status: DC

## 2015-11-20 MED ORDER — CANAGLIFLOZIN 300 MG PO TABS: 300.0000 mg | ORAL_TABLET | Freq: Every day | ORAL | 3 refills | Status: DC

## 2015-11-20 NOTE — Progress Notes (Signed)
Patient ID: Arthur Mata, male   DOB: November 27, 1955, 60 y.o.   MRN: [id number]           Reason for Appointment:  Follow-up for Type 2 Diabetes  Referring physician: Girtha Rm  History of Present Illness:          Diagnosis: Type 2 diabetes mellitus, date of diagnosis: 12/2011      Past history:  At the time of diagnosis he had symptoms of frequent urination, thirst and blurred vision He thinks his blood sugar was about 600.  His physician started him on Novolog and also Metformin  Details of initial treatment are not available but he also apparently took Victoza at some point before insulin and he does not think it worked After some time he was also given Levemir in addition to his NovoLog His blood sugars were somewhat better controlled after some time and were averaging about 200. He thinks that because of diarrhea his metformin was stopped after a few months.  Apparently in 10/2012 he had epidural steroid and with this his blood sugar went up to 600.   He had continued to have low back pain also has not had  repeat epidural steroid injections However he thinks his blood sugars had been persistently poorly controlled since then. He has been under the care of an endocrinologist in [place]. Actos was started in 06/2012 without much benefit. He was also tried on Invokana for about 3 months and reportedly had no benefit from this. He was started on U-500 instead of Levemir and NovoLog in late 2014 His blood sugars had been poorly controlled with A1c ranging from 10.5-12.5 and recently 11.5 prior to his initial consultation.  Recent history:   INSULIN regimen is described as:  Toujeo 160 units 10 pm., Humalog U-200 before meals = 80-50-80   On his initial consultation he was switched from the U-500 insulin to Toujeo 120 units hs and Humalog U-200   With this his blood sugars improved considerably and insulin dose was reduced, also A1c came down to 7.2 However subsequently his A1c has  progressively increased has been over 8%, recently 10.2  Appears that he has not followed instructions from his last visit for taking Toujeo twice a day, adding Invokana to Actos and checking blood sugars after meals     Current blood sugar patterns, daily management and problems identified:  He has been increasing his Toujeo insulin and is not taking 160 units  However he was supposed to split the dose to twice a day  Also for some reason he is not taking the Actos that appeared to be helping his sugars  Invokana was prescribed and he has not picked up the prescription for some reason  He is again forgetting to check his blood sugars after meals  At times will forget his mealtime insulin when eating out  He thinks most of his high readings at when he goes off his diet but has only 3 nonfasting reading   His blood sugars have overall improved although inconsistent in the mornings  He is generally eating oatmeal at breakfast but not checking blood sugars before lunch  He may sometimes eat higher fat cold cut sandwiches at lunch   Oral hypoglycemic drugs the patient is taking are:  ? Actos 30 mg      Side effects from medications have been: Diarrhea from metformin Compliance with the medical regimen: Good   Glucose monitoring:  done 1-2 times a day  Glucometer: Contour next      Blood Glucose readings by monitor download::  Mean values apply above for all meters except median for One Touch  PRE-MEAL Fasting Lunch Dinner Bedtime Overall  Glucose range: 113-213       Mean/median: 160        POST-MEAL PC Breakfast PC Lunch PC Dinner  Glucose range:   279, 310  353   Mean/median:        Self-care:   He has seen the dietitian and has been trying to usually get low carbohydrate low fat meals.  Meals: 3 meals per day. Breakfast is usually cereal now, lunch is usually crackers, evening meal usually chicken or small amount of beef or deer meat and vegetables.  Snacks:  Popcorn, orange or apple            Exercise:  minimal recently Dietician visit, most recent: 1/16               Weight history: Previously 202-236  Wt Readings from Last 3 Encounters:  11/20/15 231 lb 6.4 oz (105 kg)  10/21/15 230 lb (104.3 kg)  10/18/15 230 lb (104.3 kg)   Glycemic control:   Lab Results  Component Value Date   HGBA1C 10.2 (H) 10/11/2015   HGBA1C 9.3 07/04/2015   HGBA1C 8.7 (H) 04/11/2015   Lab Results  Component Value Date   MICROALBUR 2.6 (H) 04/04/2015   LDLCALC 91 04/04/2015   CREATININE 1.08 10/11/2015         Medication List       Accurate as of 11/20/15  4:39 PM. Always use your most recent med list.          alprazolam 2 MG tablet Commonly known as:  XANAX Take 1-2 mg by mouth 2 (two) times daily as needed for anxiety.   aspirin 81 MG tablet Take 81 mg by mouth every evening.   atorvastatin 40 MG tablet Commonly known as:  LIPITOR Take 40 mg by mouth every evening.   buPROPion 150 MG 12 hr tablet Commonly known as:  WELLBUTRIN SR Take 150 mg by mouth 2 (two) times daily.   canagliflozin 300 MG Tabs tablet Commonly known as:  INVOKANA Take 1 tablet (300 mg total) by mouth daily before breakfast.   diazepam 5 MG tablet Commonly known as:  VALIUM Take 1 tablet (5 mg total) by mouth every 6 (six) hours as needed for muscle spasms.   digoxin 0.25 MG tablet Commonly known as:  LANOXIN Take 0.25 mg by mouth daily.   esomeprazole 40 MG capsule Commonly known as:  NEXIUM Take 1 capsule (40 mg total) by mouth daily.   gabapentin 600 MG tablet Commonly known as:  NEURONTIN Take 600 mg by mouth at bedtime.   Insulin Lispro 200 UNIT/ML Sopn Commonly known as:  HUMALOG KWIKPEN Inject 130 Units into the skin 3 (three) times daily. Inject 50 units at breakfast, 30 units at lunch and 50 units at dinner   lisinopril 5 MG tablet Commonly known as:  PRINIVIL,ZESTRIL Take 5 mg by mouth every evening.   metoprolol succinate 25 MG  24 hr tablet Commonly known as:  TOPROL-XL Take 25 mg by mouth at bedtime.   nitroGLYCERIN 0.4 MG SL tablet Commonly known as:  NITROSTAT Place 1 tablet (0.4 mg total) under the tongue every 5 (five) minutes as needed for chest pain.   oxyCODONE-acetaminophen 5-325 MG tablet Commonly known as:  ROXICET Take 1-2 tablets by mouth every 4 (four)  hours as needed for severe pain.   pioglitazone 30 MG tablet Commonly known as:  ACTOS Take 1 tablet (30 mg total) by mouth daily.   TOUJEO SOLOSTAR 300 UNIT/ML Sopn Generic drug:  Insulin Glargine INJECT 120 UNITS INTO THE SKIN DAILY BEFORE BREAKFAST.   VICTOZA 18 MG/3ML Sopn Generic drug:  Liraglutide Inject 1.8 mg into the skin daily.       Allergies:  Allergies  Allergen Reactions  . Prednisone Other (See Comments)    Run sugar over 600 causes hospitalization    Past Medical History:  Diagnosis Date  . Angina pectoris ([place])   . Arthritis   . Bone spur of left foot   . Chest pain   . COPD (chronic obstructive pulmonary disease) ([place])   . Coronary artery disease    no stents - some blockage (35% in "widowmaker) - 2014  . Diabetes mellitus without complication ([place])    type 2  . Dysrhythmia    goes fast at times  . GERD (gastroesophageal reflux disease)   . History of bronchitis   . History of hiatal hernia   . History of pneumonia   . Hyperlipidemia   . Hypertension   . Measles   . Mumps   . Near syncope   . Neuropathy ([place])   . Obesity   . Palpitations   . Pneumonia     Past Surgical History:  Procedure Laterality Date  . ANTERIOR CERVICAL DECOMP/DISCECTOMY FUSION N/A 04/26/2015   Procedure: Anterior Cervical Discectomy and Fusion - Cervical five-Cervical six with alta interbody;  Surgeon: Kristeen Miss, MD;  Location: [facility];  Service: Neurosurgery;  Laterality: N/A;  . ANTERIOR CERVICAL DECOMP/DISCECTOMY FUSION N/A 10/21/2015   Procedure: Cervical seven-Thoracic one ANTERIOR CERVICAL  DECOMPRESSION/DISKECTOMY/FUSION WITH ALTA PLATE;  Surgeon: Kristeen Miss, MD;  Location: [facility];  Service: Neurosurgery;  Laterality: N/A;  C7-T1 ANTERIOR CERVICAL DECOMPRESSION/DISKECTOMY/FUSION WITH ALTA PLATE  . ANTERIOR CERVICAL DISCECTOMY    . CARDIAC CATHETERIZATION    . CARPAL TUNNEL RELEASE Right 07/19/2015   Procedure: Right Carpal Tunnel Release;  Surgeon: Kristeen Miss, MD;  Location: [facility];  Service: Neurosurgery;  Laterality: Right;  Right Carpal tunnel release  . COLONOSCOPY    . EYE SURGERY Bilateral    cataracts  . FOOT SURGERY Left   . POSTERIOR CERVICAL FUSION/FORAMINOTOMY N/A 07/12/2015   Procedure: Cervical five-six Cervical seven-Thoracic one Posterior cervical fusion with instrumention;  Surgeon: Kristeen Miss, MD;  Location: [facility];  Service: Neurosurgery;  Laterality: N/A;  C5-6 C7-T1 Posterior cervical fusion with lateral mass fixation  . SPINE SURGERY  03/21/13   Lumbar fusion  . ULNAR NERVE TRANSPOSITION Right 07/19/2015   Procedure: Right Ulnar nerve release ;  Surgeon: Kristeen Miss, MD;  Location: [facility];  Service: Neurosurgery;  Laterality: Right;  Right Ulnar nerve release     Family History  Problem Relation Age of Onset  . Diabetes Mother   . Heart attack Mother   . Diabetes Father   . Heart disease Father   . Heart attack Father   . Diabetes Brother   . Hyperlipidemia Other   . Hypertension Other     Social History:  reports that he quit smoking about 7 months ago. His smoking use included Cigarettes. He started smoking about 20 months ago. He has a 6.25 pack-year smoking history. He has never used smokeless tobacco. He reports that he drinks alcohol. He reports that he does not use drugs.  Review of Systems    He may have mild hypertension.  Taking only low-dose lisinopril and metoprolol  Most recent eye exam was in 1/16       Lipids: As below.  Has been on Lipitor for quite some time       Lab Results  Component Value  Date   CHOL 143 04/04/2015   HDL 28.90 (L) 04/04/2015   LDLCALC 91 04/04/2015   TRIG 119.0 04/04/2015   CHOLHDL 5 04/04/2015                       He has a history of left leg  Numbness, tingling and burning and also some in his left foot.  Taking gabapentin high doses for this.  Foot exam in 12/15 showed the following: significantly decreased monofilament sensation in the toes especially left fifth toe, no skin lesions or ulcers on the feet and normal pulses  LABS:    Physical Examination:  BP 140/80   Pulse (!) 58   Temp 98.1 F (36.7 C)   Resp 16   Ht 5\' 11"  (1.803 m)   Wt 231 lb 6.4 oz (105 kg)   SpO2 97%   BMI 32.27 kg/m       ASSESSMENT:  Diabetes type 2, uncontrolled  See history of present illness for detailed description of his daily regimen, problems identified and blood sugar patterns  His blood sugars are progressively more difficult to control However he has been noncompliant with instructions to adjust his insulin, glucose monitoring and take his medications as directed  PLAN:   He will go back to [facility] along with Actos 30 mg daily   Increase Humalog by at least 20 units at suppertime, will need 100 units now  Split Toujeo to twice a day, 80 units each dose   He will switched to [place] on the next prescription using 150 units once a day  Emphasized the need to check more readings after meals, at least once a day  Call if blood sugar not improved  Encouraged him to look again into the insulin pump especially Omnipod  Most likely will need to switch him from Humalog to U-500 insulin when he runs out of this and continue basal insulin  Patient Instructions  Toujeo 80 units twice daily  Humalog 100 at supper  Low fat diet  Restart actos and Invokana  MUST CHECK SUGARS AT NITE DAILY  Change to [place] after Toujeo runs out       Counseling time on subjects discussed above is over 50% of today's 25 minute  visit   Orazio Weller 11/20/2015, 4:39 PM   Note: This office note was prepared with Dragon voice recognition system technology. Any transcriptional errors that result from this process are unintentional.

## 2015-11-20 NOTE — Patient Instructions (Addendum)
Toujeo 80 units twice daily  Humalog 100 at supper  Low fat diet  Restart actos and Invokana  MUST CHECK SUGARS AT NITE DAILY  Change to [place] after Toujeo runs out

## 2015-11-21 DIAGNOSIS — E1140 Type 2 diabetes mellitus with diabetic neuropathy, unspecified: Secondary | ICD-10-CM | POA: Diagnosis not present

## 2015-11-21 DIAGNOSIS — Z86010 Personal history of colonic polyps: Secondary | ICD-10-CM | POA: Diagnosis not present

## 2015-11-21 DIAGNOSIS — F419 Anxiety disorder, unspecified: Secondary | ICD-10-CM | POA: Diagnosis not present

## 2015-11-21 DIAGNOSIS — F17201 Nicotine dependence, unspecified, in remission: Secondary | ICD-10-CM | POA: Diagnosis not present

## 2015-11-28 ENCOUNTER — Telehealth: Payer: Self-pay | Admitting: Gastroenterology

## 2015-12-10 ENCOUNTER — Other Ambulatory Visit: Payer: Self-pay | Admitting: Pharmacist

## 2015-12-10 ENCOUNTER — Encounter: Payer: Self-pay | Admitting: Pharmacist

## 2015-12-10 MED FILL — CONTOUR NEXT STRIPS: 30 days supply | Qty: 150 | Fill #1

## 2015-12-10 NOTE — Patient Outreach (Signed)
Subjective:  Patient presents today for 3 month diabetes follow-up as part of the employer-sponsored Link to Wellness program.  Current diabetes regimen includes Toujeo and Humalog (and questionably supposed to be on pioglitazone and Invokana; to follow up with endo).  Patient also continues on daily ASA, ACE Inhibitor and statin.  Recent neck surgery, appears to be recovering well.    Assessment:  Diabetes: Most recent A1C was 10.2% which is exceeding goal of less than 7%. Weight is slightly increased from last visit with me.   Lifestyle improvements:  Physical Activity- Arthur Mata remains fairly active working around a [employer], [employer]. He reports varied amounts of work which makes blood glucose control more difficult.  Nutrition- Today we discussed again some sources of carbohydrates. He expressed frustration of "being told one thing by one doctor and another thing by another doctor" so we discussed some of the difficulties of being restricted and the fact that different groups are giving different messages.   Plan/Goals for Next Visit:  1. Discuss with Dr. Dwyane Dee: pioglitazone and Invokana (should be taking both?). 2. Keep a tighter reign on food choices. Weight and blood glucoses have consistently increased; time to get better focused now. 3. Talk with Dr. Dwyane Dee about pump.    Next appointment to see me is:

## 2015-12-16 MED FILL — LISINOPRIL 5 MG TABLET: 5 | 90 days supply | Qty: 90 | Fill #0

## 2015-12-16 MED FILL — BUPROPION HCL SR 150 MG TAB: 150 | 30 days supply | Qty: 60 | Fill #3

## 2015-12-16 MED FILL — ATORVASTATIN 40 MG TABLET: 40 | 90 days supply | Qty: 90 | Fill #0

## 2015-12-19 ENCOUNTER — Ambulatory Visit: Payer: 59 | Admitting: Endocrinology

## 2015-12-24 MED FILL — TOUJEO SOLOSTAR 300 UNITS/M: 300 | 23 days supply | Qty: 9 | Fill #2

## 2015-12-25 MED FILL — BD PEN NDL NANO 32GX5/32": 32G X 4 MM | 25 days supply | Qty: 100 | Fill #0

## 2015-12-25 MED FILL — BD PEN NDL NANO 32GX5/32: 32G X 4 MM | 25 days supply | Qty: 100 | Fill #0

## 2015-12-27 NOTE — Telephone Encounter (Signed)
Dr. Fuller Plan reviewed records and has accepted patient. Ok to schedule [facility]. I left message for patient to callback and schedule on 10/5. Patient has not returned phone call. Records will be in "records reviewed" folder

## 2016-01-01 ENCOUNTER — Encounter: Payer: Self-pay | Admitting: Endocrinology

## 2016-01-01 ENCOUNTER — Ambulatory Visit (INDEPENDENT_AMBULATORY_CARE_PROVIDER_SITE_OTHER): Payer: 59 | Admitting: Endocrinology

## 2016-01-01 ENCOUNTER — Telehealth: Payer: Self-pay | Admitting: Nutrition

## 2016-01-01 VITALS — BP 118/70 | HR 59 | Ht 71.0 in | Wt 225.0 lb

## 2016-01-01 DIAGNOSIS — I25119 Atherosclerotic heart disease of native coronary artery with unspecified angina pectoris: Secondary | ICD-10-CM

## 2016-01-01 DIAGNOSIS — Z23 Encounter for immunization: Secondary | ICD-10-CM

## 2016-01-01 DIAGNOSIS — E1165 Type 2 diabetes mellitus with hyperglycemia: Secondary | ICD-10-CM | POA: Diagnosis not present

## 2016-01-01 DIAGNOSIS — Z794 Long term (current) use of insulin: Secondary | ICD-10-CM

## 2016-01-01 LAB — POCT GLYCOSYLATED HEMOGLOBIN (HGB A1C): Hemoglobin A1C: 8.8

## 2016-01-01 NOTE — Progress Notes (Signed)
Patient ID: Arthur Mata, male   DOB: [birth year], 60 y.o.   MRN: [id number]           Reason for Appointment:  Follow-up for Type 2 Diabetes  Referring physician: Girtha Rm  History of Present Illness:          Diagnosis: Type 2 diabetes mellitus, date of diagnosis: 12/2011      Past history:  At the time of diagnosis he had symptoms of frequent urination, thirst and blurred vision He thinks his blood sugar was about 600.  His physician started him on Novolog and also Metformin  Details of initial treatment are not available but he also apparently took Victoza at some point before insulin and he does not think it worked After some time he was also given Levemir in addition to his NovoLog His blood sugars were somewhat better controlled after some time and were averaging about 200. He thinks that because of diarrhea his metformin was stopped after a few months.  Apparently in 10/2012 he had epidural steroid and with this his blood sugar went up to 600.   He had continued to have low back pain also has not had  repeat epidural steroid injections However he thinks his blood sugars had been persistently poorly controlled since then. He has been under the care of an endocrinologist in [place]. Actos was started in 06/2012 without much benefit. He was also tried on Invokana for about 3 months and reportedly had no benefit from this. He was started on U-500 instead of Levemir and NovoLog in late 2014 His blood sugars had been poorly controlled with A1c ranging from 10.5-12.5 and recently 11.5 prior to his initial consultation.  Recent history:   INSULIN regimen is described as:  Toujeo 80 units twice a day., Humalog U-200 before meals = 60-50-60  Non-insulin hypoglycemic drugs the patient is taking: Invokana 300 mg daily, Victoza 1.8 mg daily And Actos 30 mg    On his initial consultation he was switched from the U-500 insulin to Toujeo 120 units hs and Humalog U-200 With this his blood sugars  improved considerably and insulin dose was reduced, also A1c came down to 7.2 However subsequently his A1c has increased  A1c today is finally improved at 8.8, previously 10.2     Current blood sugar patterns, daily management and problems identified:  He has been taking his Toujeo twice a day instead of once a day since his last visit.  However his average blood sugars in the mornings are slightly higher, previously 160.  Lid sugars FASTING are fluctuating however with the lowest reading only 123.  Also he was started back on Invokana on his last visit because of tendency to weight gain  He has been instructed repeatedly to try checking blood sugars after meals and he did several readings last month which were consistently high  He thinks he is compliant with taking his Humalog before meals including suppertime but for some reason he is taking only 60 units instead of the recommended dose of 80 units on the last visit  He has only sporadic readings during the day blood sugar on one occasion was high after lunch  He thinks he is cutting back on his portions overall and has lost some weight  Not doing much physical activity currently  No hypoglycemia except rarely, not clear why he had a glucose of 49 at bedtime on the 14th of last month  He is generally eating oatmeal at breakfast  He may sometimes eat higher fat cold cut sandwiches at lunch  Side effects from medications have been: Diarrhea from metformin  Glucose monitoring:  done 1-2 times a day         Glucometer: Contour next      Blood Glucose readings by monitor download:  Mean values apply above for all meters except median for One Touch  PRE-MEAL Fasting Lunch Dinner Bedtime Overall  Glucose range: 123-247    49-404   Mean/median: 185    290  200   POST-MEAL PC Breakfast PC Lunch PC Dinner  Glucose range:  188   Mean/median:       Self-care:   He has seen the dietitian and has been trying to usually get low  carbohydrate low fat meals.  Meals: 3 meals per day. Breakfast is cereal Or oatmeal, lunch is usually liked, evening meal usually chicken or small amount of beef or deer meat and vegetables.  Snacks: Popcorn, orange or apple            Exercise:  minimal recently Dietician visit, most recent: 1/16               Weight history: Previously 202-236  Wt Readings from Last 3 Encounters:  01/01/16 225 lb (102.1 kg)  12/10/15 231 lb 9.6 oz (105.1 kg)  11/20/15 231 lb 6.4 oz (105 kg)   Glycemic control:   Lab Results  Component Value Date   HGBA1C 8.8 01/01/2016   HGBA1C 10.2 (H) 10/11/2015   HGBA1C 9.3 07/04/2015   Lab Results  Component Value Date   MICROALBUR 2.6 (H) 04/04/2015   LDLCALC 91 04/04/2015   CREATININE 1.08 10/11/2015         Medication List       Accurate as of 01/01/16 12:03 PM. Always use your most recent med list.          alprazolam 2 MG tablet Commonly known as:  XANAX Take 1-2 mg by mouth 2 (two) times daily as needed for anxiety.   aspirin 81 MG tablet Take 81 mg by mouth every evening.   atorvastatin 40 MG tablet Commonly known as:  LIPITOR Take 40 mg by mouth every evening.   buPROPion 150 MG 12 hr tablet Commonly known as:  WELLBUTRIN SR Take 150 mg by mouth 2 (two) times daily.   canagliflozin 300 MG Tabs tablet Commonly known as:  INVOKANA Take 1 tablet (300 mg total) by mouth daily before breakfast.   diazepam 5 MG tablet Commonly known as:  VALIUM Take 1 tablet (5 mg total) by mouth every 6 (six) hours as needed for muscle spasms.   digoxin 0.25 MG tablet Commonly known as:  LANOXIN Take 0.25 mg by mouth daily.   esomeprazole 40 MG capsule Commonly known as:  NEXIUM Take 1 capsule (40 mg total) by mouth daily.   gabapentin 600 MG tablet Commonly known as:  NEURONTIN Take 600 mg by mouth at bedtime.   Insulin Lispro 200 UNIT/ML Sopn Commonly known as:  HUMALOG KWIKPEN Inject 130 Units into the skin 3 (three) times  daily. Inject 50 units at breakfast, 30 units at lunch and 50 units at dinner   lisinopril 5 MG tablet Commonly known as:  PRINIVIL,ZESTRIL Take 5 mg by mouth every evening.   metoprolol succinate 25 MG 24 hr tablet Commonly known as:  TOPROL-XL Take 25 mg by mouth at bedtime.   nitroGLYCERIN 0.4 MG SL tablet Commonly known as:  NITROSTAT Place 1 tablet (  0.4 mg total) under the tongue every 5 (five) minutes as needed for chest pain.   oxyCODONE-acetaminophen 5-325 MG tablet Commonly known as:  ROXICET Take 1-2 tablets by mouth every 4 (four) hours as needed for severe pain.   pioglitazone 30 MG tablet Commonly known as:  ACTOS Take 1 tablet (30 mg total) by mouth daily.   TOUJEO SOLOSTAR 300 UNIT/ML Sopn Generic drug:  Insulin Glargine INJECT 120 UNITS INTO THE SKIN DAILY BEFORE BREAKFAST.   VICTOZA 18 MG/3ML Sopn Generic drug:  liraglutide Inject 1.8 mg into the skin daily.       Allergies:  Allergies  Allergen Reactions  . Prednisone Other (See Comments)    Run sugar over 600 causes hospitalization    Past Medical History:  Diagnosis Date  . Ang[place] pectoris ([place])   . Arthritis   . Bone spur of left foot   . Chest pain   . COPD (chronic obstructive pulmonary disease) ([place])   . Coronary artery disease    no stents - some blockage (35% in "widowmaker) - 2014  . Diabetes mellitus without complication ([place])    type 2  . Dysrhythmia    goes fast at times  . GERD (gastroesophageal reflux disease)   . History of bronchitis   . History of hiatal hernia   . History of pneumonia   . Hyperlipidemia   . Hypertension   . Measles   . Mumps   . Near syncope   . Neuropathy ([place])   . Obesity   . Palpitations   . Pneumonia     Past Surgical History:  Procedure Laterality Date  . ANTERIOR CERVICAL DECOMP/DISCECTOMY FUSION N/A 04/26/2015   Procedure: Anterior Cervical Discectomy and Fusion - Cervical five-Cervical six with alta interbody;  Surgeon: Kristeen Miss, MD;   Location: [place] [facility];  Service: Neurosurgery;  Laterality: N/A;  . ANTERIOR CERVICAL DECOMP/DISCECTOMY FUSION N/A 10/21/2015   Procedure: Cervical seven-Thoracic one ANTERIOR CERVICAL DECOMPRESSION/DISKECTOMY/FUSION WITH ALTA PLATE;  Surgeon: Kristeen Miss, MD;  Location: [place];  Service: Neurosurgery;  Laterality: N/A;  C7-T1 ANTERIOR CERVICAL DECOMPRESSION/DISKECTOMY/FUSION WITH ALTA PLATE  . ANTERIOR CERVICAL DISCECTOMY    . CARDIAC CATHETERIZATION    . CARPAL TUNNEL RELEASE Right 07/19/2015   Procedure: Right Carpal Tunnel Release;  Surgeon: Kristeen Miss, MD;  Location: [facility];  Service: Neurosurgery;  Laterality: Right;  Right Carpal tunnel release  . COLONOSCOPY    . EYE SURGERY Bilateral    cataracts  . FOOT SURGERY Left   . POSTERIOR CERVICAL FUSION/FORAMINOTOMY N/A 07/12/2015   Procedure: Cervical five-six Cervical seven-Thoracic one Posterior cervical fusion with instrumention;  Surgeon: Kristeen Miss, MD;  Location: [facility];  Service: Neurosurgery;  Laterality: N/A;  C5-6 C7-T1 Posterior cervical fusion with lateral mass fixation  . SPINE SURGERY  03/21/13   Lumbar fusion  . ULNAR NERVE TRANSPOSITION Right 07/19/2015   Procedure: Right Ulnar nerve release ;  Surgeon: Kristeen Miss, MD;  Location: [place] [facility];  Service: Neurosurgery;  Laterality: Right;  Right Ulnar nerve release     Family History  Problem Relation Age of Onset  . Diabetes Mother   . Heart attack Mother   . Diabetes Father   . Heart disease Father   . Heart attack Father   . Diabetes Brother   . Hyperlipidemia Other   . Hypertension Other     Social History:  reports that he quit smoking about 9 months ago. His smoking use included Cigarettes. He  started smoking about 22 months ago. He has a 6.25 pack-year smoking history. He has never used smokeless tobacco. He reports that he drinks alcohol. He reports that he does not use drugs.    Review of Systems    He may have mild hypertension,  treated by PCP with low-dose lisinopril and metoprolol  Most recent eye exam was in 1/16       Lipids: As below.  Has been on Lipitor for Control and his labs have been fairly good       Lab Results  Component Value Date   CHOL 143 04/04/2015   HDL 28.90 (L) 04/04/2015   LDLCALC 91 04/04/2015   TRIG 119.0 04/04/2015   CHOLHDL 5 04/04/2015                       He has a history of left leg  Numbness, tingling and burning and also some in his left foot.  Taking gabapentin high doses for this.  Foot exam in 2/17 showed the following: significantly decreased monofilament sensation in the toes especially left fifth toe, no skin lesions or ulcers on the feet and normal pulses  LABS:    Physical Examination:  BP 118/70   Pulse (!) 59   Ht 5\' 11"  (1.803 m)   Wt 225 lb (102.1 kg)   SpO2 97%   BMI 31.38 kg/m       ASSESSMENT:  Diabetes type 2, uncontrolled  See history of present illness for detailed description of his daily regimen, problems identified and blood sugar patterns  He is on large doses of insulin along with other agents including Actos, Invokana and Victoza  His blood sugars are progressively more difficult to control but now his A1c is improving  Appears that he has marked increase in blood sugars after evening meal with current regimen of 60 units of Humalog However checking post prandial readings very infrequently and none in the last 2 weeks He has lost weight, generally watching portions Most likely is benefiting somewhat from Invokana added on the last visit  PLAN:   He will switch to [place] for once a day convenience and probably better efficacy, he can continue to finish off his Toujeo in the meantime but increase the dose to 80 units in the morning and 75 in the evening  Continuous glucose monitoring with a FreeStyle libre for the next 2 weeks  This will identify periods of hyperglycemia and correlate postprandial readings with his  diet  Increase Humalog by at least 20 units at suppertime, will need 80 units   Emphasized the need to check more readings after meals, at least once a day  May also still consider insulin pump next year, he is still hesitant to do this  Given influenza vaccine and also he is requesting update DTP vaccine because of having a young grandbaby at home   Patient Instructions  Humalog 75-80 at supper  Toujeo 80---75 in pm  Check blood sugars on waking up  daily  Also check blood sugars about 2 hours after a meal and do this after different meals by rotation  Recommended blood sugar levels on waking up is 90-130 and about 2 hours after meal is 130-160  Please bring your blood sugar monitor to each visit, thank you        Counseling time on subjects discussed above is over 50% of today's 25 minute visit   Lorenza Shakir 01/01/2016, 12:03 PM   Note: This  office note was prepared with Estate agent. Any transcriptional errors that result from this process are unintentional.

## 2016-01-01 NOTE — Telephone Encounter (Signed)
FreeStyle CGM put on patients upper outer left arm.  Lot # [id number]  Exp. Date 03/25/16.  SN: [id number].   We discussed the importance of doing a daily log of insulin doses, meals, and exercise.  He was given a sheet for this and shown how to fil it in.  He reported good understanding of this and had no final questions.

## 2016-01-01 NOTE — Patient Instructions (Signed)
Humalog 75-80 at supper  Toujeo 80---75 in pm  Check blood sugars on waking up  daily  Also check blood sugars about 2 hours after a meal and do this after different meals by rotation  Recommended blood sugar levels on waking up is 90-130 and about 2 hours after meal is 130-160  Please bring your blood sugar monitor to each visit, thank you

## 2016-01-13 ENCOUNTER — Encounter: Payer: Self-pay | Admitting: Gastroenterology

## 2016-01-13 MED FILL — ESOMEPRAZOLE MAG DR 40 MG C: 40 | 90 days supply | Qty: 90 | Fill #1

## 2016-01-13 MED FILL — ALPRAZolam 2 MG TABS: 2 | 30 days supply | Qty: 90 | Fill #2

## 2016-01-13 MED FILL — METOPROLOL SUCC ER 25 MG TA: 25 | 90 days supply | Qty: 90 | Fill #0

## 2016-01-14 ENCOUNTER — Encounter: Payer: Self-pay | Admitting: Internal Medicine

## 2016-01-14 ENCOUNTER — Ambulatory Visit (INDEPENDENT_AMBULATORY_CARE_PROVIDER_SITE_OTHER): Payer: 59 | Admitting: Internal Medicine

## 2016-01-14 VITALS — BP 132/70 | HR 56 | Ht 71.0 in | Wt 234.0 lb

## 2016-01-14 DIAGNOSIS — K219 Gastro-esophageal reflux disease without esophagitis: Secondary | ICD-10-CM | POA: Diagnosis not present

## 2016-01-14 DIAGNOSIS — I25119 Atherosclerotic heart disease of native coronary artery with unspecified angina pectoris: Secondary | ICD-10-CM | POA: Diagnosis not present

## 2016-01-14 DIAGNOSIS — I209 Angina pectoris, unspecified: Secondary | ICD-10-CM

## 2016-01-14 DIAGNOSIS — I10 Essential (primary) hypertension: Secondary | ICD-10-CM | POA: Diagnosis not present

## 2016-01-14 DIAGNOSIS — E785 Hyperlipidemia, unspecified: Secondary | ICD-10-CM | POA: Diagnosis not present

## 2016-01-14 LAB — LIPID PANEL
CHOLESTEROL: 148 mg/dL (ref <200)
HDL: 23 mg/dL — ABNORMAL LOW (ref >40)
LDL Cholesterol: 103 mg/dL — ABNORMAL HIGH (ref <100)
TRIGLYCERIDES: 111 mg/dL (ref <150)
Total CHOL/HDL Ratio: 6.4 Ratio — ABNORMAL HIGH (ref <5.0)
VLDL: 22 mg/dL (ref <30)

## 2016-01-14 LAB — COMPREHENSIVE METABOLIC PANEL
ALK PHOS: 91 U/L (ref 40–115)
ALT: 65 U/L — AB (ref 9–46)
AST: 56 U/L — ABNORMAL HIGH (ref 10–35)
Albumin: 4 g/dL (ref 3.6–5.1)
BILIRUBIN TOTAL: 1.1 mg/dL (ref 0.2–1.2)
BUN: 11 mg/dL (ref 7–25)
CALCIUM: 9.4 mg/dL (ref 8.6–10.3)
CO2: 25 mmol/L (ref 20–31)
Chloride: 102 mmol/L (ref 98–110)
Creat: 0.99 mg/dL (ref 0.70–1.33)
Glucose, Bld: 176 mg/dL — ABNORMAL HIGH (ref 65–99)
POTASSIUM: 4.9 mmol/L (ref 3.5–5.3)
Sodium: 138 mmol/L (ref 135–146)
TOTAL PROTEIN: 6.8 g/dL (ref 6.1–8.1)

## 2016-01-14 LAB — BASIC METABOLIC PANEL
BUN: 11 mg/dL (ref 7–25)
CALCIUM: 9.4 mg/dL (ref 8.6–10.3)
CO2: 25 mmol/L (ref 20–31)
Chloride: 102 mmol/L (ref 98–110)
Creat: 0.99 mg/dL (ref 0.70–1.33)
Glucose, Bld: 176 mg/dL — ABNORMAL HIGH (ref 65–99)
Potassium: 4.9 mmol/L (ref 3.5–5.3)
SODIUM: 138 mmol/L (ref 135–146)

## 2016-01-14 NOTE — Patient Instructions (Signed)
Medication Instructions:  Your physician recommends that you continue on your current medications as directed. Please refer to the Current Medication list given to you today.  Labwork: Your physician recommends that you return for lab work in: TODAY-LIPID, BMP, CMET, DIGOXIN LEVEL  Testing/Procedures: NONE  Follow-Up: Your physician wants you to follow-up in: 6 MONTHS WITH EXTENDER AND 12 MONTHS WITH DR HILTY. You will receive a reminder letter in the mail two months in advance. If you don't receive a letter, please call our office to schedule the follow-up appointment.  Any Other Special Instructions Will Be Listed Below (If Applicable).     If you need a refill on your cardiac medications before your next appointment, please call your pharmacy.

## 2016-01-14 NOTE — Progress Notes (Signed)
OFFICE NOTE  Chief Complaint:  Follow-up surgeries  Primary Care Physician: Cecilie Lowers, MD  HPI:  Arthur Mata is a 60 y.o. male who recently saw Almyra Deforest, PA-C, for ongoing chest pain. He has a past medical history significant for coronary artery disease, last assessed in 2014 by heart catheterization in [place], [place]. He was previous he followed by Dr. Warren Danes prior to his retirement. Arthur Mata is been having significant anterior chest pain which she thinks is mostly like heartburn. He describes as a burning sensation that travels from the upper mid epigastrium up to his throat and is more likely to be present at rest rather than with exertion. He reports his wife has been giving him a "over-the-counter medication" for his heartburn, but was not clear as to what that was. I advised him to call his wife during the office visit and she noted that it was Prilosec. He does not often take it. He also says he's using Tums very regularly. In the past he had used Nexium. He was referred for stress testing given his history of coronary disease and underwent an echo and nuclear stress test. Both the echo and stress test were fairly normal and showed normal LV function. He is scheduled to have cervical surgery after recent trauma cause problems with his neck. He also is noted that he has difficulty swallowing and has food that sticks in his throat. He apparently was seen by an ENT who did a fiberoptic exam and said that he could not find a reason for swallowing problem.  01/14/2016  Arthur Mata returns for follow-up of his surgeries. Recently he has required a number of surgeries and is doing quite well. He denies chest pain, dyspnea or perioperative complications. HE is on digoxin presumably for palpitations in the past- no clear history of PAF.   PMHx:  Past Medical History:  Diagnosis Date  . Angina pectoris ([place])   . Arthritis   . Bone spur of left foot   . Chest pain   . COPD  (chronic obstructive pulmonary disease) ([place])   . Coronary artery disease    no stents - some blockage (35% in "widowmaker) - 2014  . Diabetes mellitus without complication ([place])    type 2  . Dysrhythmia    goes fast at times  . GERD (gastroesophageal reflux disease)   . History of bronchitis   . History of hiatal hernia   . History of pneumonia   . Hyperlipidemia   . Hypertension   . Measles   . Mumps   . Near syncope   . Neuropathy ([place])   . Obesity   . Palpitations   . Pneumonia     Past Surgical History:  Procedure Laterality Date  . ANTERIOR CERVICAL DECOMP/DISCECTOMY FUSION N/A 04/26/2015   Procedure: Anterior Cervical Discectomy and Fusion - Cervical five-Cervical six with alta interbody;  Surgeon: Kristeen Miss, MD;  Location: [place] [facility];  Service: Neurosurgery;  Laterality: N/A;  . ANTERIOR CERVICAL DECOMP/DISCECTOMY FUSION N/A 10/21/2015   Procedure: Cervical seven-Thoracic one ANTERIOR CERVICAL DECOMPRESSION/DISKECTOMY/FUSION WITH ALTA PLATE;  Surgeon: Kristeen Miss, MD;  Location: [place];  Service: Neurosurgery;  Laterality: N/A;  C7-T1 ANTERIOR CERVICAL DECOMPRESSION/DISKECTOMY/FUSION WITH ALTA PLATE  . ANTERIOR CERVICAL DISCECTOMY    . CARDIAC CATHETERIZATION    . CARPAL TUNNEL RELEASE Right 07/19/2015   Procedure: Right Carpal Tunnel Release;  Surgeon: Kristeen Miss, MD;  Location: [place];  Service: Neurosurgery;  Laterality: Right;  Right Carpal tunnel release  . COLONOSCOPY    . EYE SURGERY Bilateral    cataracts  . FOOT SURGERY Left   . POSTERIOR CERVICAL FUSION/FORAMINOTOMY N/A 07/12/2015   Procedure: Cervical five-six Cervical seven-Thoracic one Posterior cervical fusion with instrumention;  Surgeon: Kristeen Miss, MD;  Location: [facility];  Service: Neurosurgery;  Laterality: N/A;  C5-6 C7-T1 Posterior cervical fusion with lateral mass fixation  . SPINE SURGERY  03/21/13   Lumbar fusion  . ULNAR NERVE TRANSPOSITION Right 07/19/2015   Procedure:  Right Ulnar nerve release ;  Surgeon: Kristeen Miss, MD;  Location: [facility];  Service: Neurosurgery;  Laterality: Right;  Right Ulnar nerve release     FAMHx:  Family History  Problem Relation Age of Onset  . Diabetes Mother   . Heart attack Mother   . Diabetes Father   . Heart disease Father   . Heart attack Father   . Diabetes Brother   . Hyperlipidemia Other   . Hypertension Other     SOCHx:   reports that he quit smoking about 9 months ago. His smoking use included Cigarettes. He started smoking about 22 months ago. He has a 6.25 pack-year smoking history. He has never used smokeless tobacco. He reports that he drinks alcohol. He reports that he does not use drugs.  ALLERGIES:  Allergies  Allergen Reactions  . Prednisone Other (See Comments)    Run sugar over 600 causes hospitalization    ROS: Pertinent items noted in HPI and remainder of comprehensive ROS otherwise negative.  HOME MEDS: Current Outpatient Prescriptions  Medication Sig Dispense Refill  . alprazolam (XANAX) 2 MG tablet Take 1-2 mg by mouth 2 (two) times daily as needed for anxiety.  5  . aspirin 81 MG tablet Take 81 mg by mouth every evening.     Marland Kitchen atorvastatin (LIPITOR) 40 MG tablet Take 40 mg by mouth every evening.   3  . buPROPion (WELLBUTRIN SR) 150 MG 12 hr tablet Take 150 mg by mouth 2 (two) times daily.   10  . canagliflozin (INVOKANA) 300 MG TABS tablet Take 1 tablet (300 mg total) by mouth daily before breakfast. 30 tablet 3  . diazepam (VALIUM) 5 MG tablet Take 1 tablet (5 mg total) by mouth every 6 (six) hours as needed for muscle spasms. 30 tablet 0  . digoxin (LANOXIN) 0.25 MG tablet Take 0.25 mg by mouth daily.    Marland Kitchen esomeprazole (NEXIUM) 40 MG capsule Take 1 capsule (40 mg total) by mouth daily. 30 capsule 5  . gabapentin (NEURONTIN) 600 MG tablet Take 600 mg by mouth at bedtime.   3  . Insulin Lispro, Human, (HUMALOG KWIKPEN) 200 UNIT/ML SOPN Inject 130 Units into the skin 3 (three)  times daily. Inject 50 units at breakfast, 30 units at lunch and 50 units at dinner (Patient taking differently: Inject 50-80 Units into the skin 3 (three) times daily. Inject 80 units at breakfast, 50 units at lunch and 80 units at dinner) 45 mL 3  . lisinopril (PRINIVIL,ZESTRIL) 5 MG tablet Take 5 mg by mouth every evening.   3  . metoprolol succinate (TOPROL-XL) 25 MG 24 hr tablet Take 25 mg by mouth at bedtime.     Marland Kitchen oxyCODONE-acetaminophen (ROXICET) 5-325 MG tablet Take 1-2 tablets by mouth every 4 (four) hours as needed for severe pain. 60 tablet 0  . pioglitazone (ACTOS) 30 MG tablet Take 1 tablet (30 mg total) by mouth daily. [place]  tablet 3  . TOUJEO SOLOSTAR 300 UNIT/ML SOPN INJECT 120 UNITS INTO THE SKIN DAILY BEFORE BREAKFAST. (Patient taking differently: INJECT 80 UNITS INTO THE SKIN DAILY AT BEDTIME) 13.5 mL 3  . VICTOZA 18 MG/3ML SOPN Inject 1.8 mg into the skin daily.   3  . nitroGLYCERIN (NITROSTAT) 0.4 MG SL tablet Place 1 tablet (0.4 mg total) under the tongue every 5 (five) minutes as needed for chest pain. (Patient not taking: Reported on 01/14/2016) 90 tablet 3   No current facility-administered medications for this visit.     LABS/IMAGING: No results found for this or any previous visit (from the past 48 hour(s)). No results found.  WEIGHTS: Wt Readings from Last 3 Encounters:  01/14/16 234 lb (106.1 kg)  01/01/16 225 lb (102.1 kg)  12/10/15 231 lb 9.6 oz (105.1 kg)    VITALS: BP 132/70   Pulse (!) 56   Ht 5\' 11"  (1.803 m)   Wt 234 lb (106.1 kg)   SpO2 98%   BMI 32.64 kg/m   EXAM: General appearance: alert and no distress Lungs: clear to auscultation bilaterally Heart: regular rate and rhythm, S1, S2 normal, no murmur, click, rub or gallop Extremities: extremities normal, atraumatic, no cyanosis or edema Neurologic: Mental status: Alert, oriented, thought content appropriate  EKG: Deferred  ASSESSMENT: 1. Atypical chest pain, likely GERD 2. History of  coronary disease - negative nuclear stress test and normal LV function by echo (2017) 3. Type 2 diabetes 4. Hypertension 5. Dyslipidemia  PLAN: 1.   Mr. Weyer had no difficulty with surgery. HE is overdue for labwork - will check CMET, lipid profile, digoxin level. Likely to d/c digoxin as it has been shown to be minimally effective for palpitations. Follow-up in 6 months with extender and 1 year with me.  Pixie Casino, MD, [facility] Attending Cardiologist [place] C Demaurion Dicioccio 01/14/2016, 9:48 PM

## 2016-01-15 ENCOUNTER — Encounter: Payer: Self-pay | Admitting: Endocrinology

## 2016-01-15 ENCOUNTER — Telehealth: Payer: Self-pay | Admitting: *Deleted

## 2016-01-15 ENCOUNTER — Ambulatory Visit (INDEPENDENT_AMBULATORY_CARE_PROVIDER_SITE_OTHER): Payer: 59 | Admitting: Endocrinology

## 2016-01-15 VITALS — BP 140/68 | HR 62 | Ht 71.0 in | Wt 228.2 lb

## 2016-01-15 DIAGNOSIS — E669 Obesity, unspecified: Secondary | ICD-10-CM | POA: Diagnosis not present

## 2016-01-15 DIAGNOSIS — E1165 Type 2 diabetes mellitus with hyperglycemia: Secondary | ICD-10-CM | POA: Diagnosis not present

## 2016-01-15 DIAGNOSIS — Z794 Long term (current) use of insulin: Secondary | ICD-10-CM | POA: Diagnosis not present

## 2016-01-15 DIAGNOSIS — E1169 Type 2 diabetes mellitus with other specified complication: Secondary | ICD-10-CM

## 2016-01-15 LAB — DIGOXIN LEVEL: Digoxin Level: 1 ug/L (ref 0.8–2.0)

## 2016-01-15 LAB — GLUCOSE, POCT (MANUAL RESULT ENTRY): POC GLUCOSE: 76 mg/dL (ref 70–99)

## 2016-01-15 NOTE — Telephone Encounter (Signed)
Patient voiced understanding to stop digoxin. Aware of other lab results.

## 2016-01-15 NOTE — Telephone Encounter (Signed)
-----   Message from Pixie Casino, MD sent at 01/15/2016  9:25 AM EST ----- Digoxin level is mid-range. D/c digoxin. Labs otherwise ok. Mild tranaminitis - on statin. Cholesterol is relatively stable.  Dr. Lemmie Evens

## 2016-01-15 NOTE — Patient Instructions (Addendum)
Toujeo reduce to 55 units at nite and 100 in am  Humalog 70-80 at supper  Based on sugar before eating  Am 60-70 units   Lunch 50-65 based at mealtime  Protein at each meal

## 2016-01-15 NOTE — Progress Notes (Signed)
Patient ID: Arthur BARRACLOUGH, male   DOB: 1955/04/20, 61 y.o.   MRN: [id number]                  Reason for Appointment:  Follow-up for Type 2 Diabetes  Referring physician: Girtha Rm  History of Present Illness:          Diagnosis: Type 2 diabetes mellitus, date of diagnosis: 12/2011      Past history:  At the time of diagnosis he had symptoms of frequent urination, thirst and blurred vision He thinks his blood sugar was about 600.  His physician started him on Novolog and also Metformin  Details of initial treatment are not available but he also apparently took Victoza at some point before insulin and he does not think it worked After some time he was also given Levemir in addition to his NovoLog His blood sugars were somewhat better controlled after some time and were averaging about 200. He thinks that because of diarrhea his metformin was stopped after a few months.  Apparently in 10/2012 he had epidural steroid and with this his blood sugar went up to 600.   He had continued to have low back pain also has not had  repeat epidural steroid injections However he thinks his blood sugars had been persistently poorly controlled since then. Actos was started in 06/2012 without much benefit. He was also tried on Invokana for about 3 months and reportedly had no benefit from this. He was started on U-500 instead of Levemir and NovoLog in late 2014 His blood sugars had been poorly controlled with A1c ranging from 10.5-12.5 and prior to his initial consultation.  Recent history   INSULIN regimen:  Toujeo 80 units twice a day., Humalog U-200 before meals = 60-50-60   Non-insulin hypoglycemic drugs the patient is taking: Invokana 300 mg daily, Victoza 1.8 mg daily And Actos 30 mg    On his initial consultation he was switched from the U-500 insulin to Toujeo 120 units hs and Humalog U-200 With this his blood sugars improved considerably and insulin dose was reduced, also A1c came down to 7.2 However  subsequently his A1c has increased with last A1c 8.2  He is here for evaluation of his continuous glucose monitoring for the last 2 weeks, C detailed report below     Current blood sugar patterns, daily management and problems identified:  As discussed below his blood sugars are fluctuating significantly with variable patterns on different days  However he has tendency to progressively lower blood sugars overnight most of the time and occasional nocturnal HYPOGLYCEMIA, documented by fingerstick at 2 AM once with a glucose of 48  Fingerstick glucose also is variable in the mornings but still averaging about 170   However his blood sugars generally tends to be mostly higher between morning and late evening  HIGHEST blood sugars are generally after evening meal  Has  most variability in blood sugars in the afternoons with readings as high as 350+ and occasionally as low as 50  His food record was reviewed and he has somewhat variable intake, occasionally higher fat but cannot correlate blood sugars well with type of meals except occasional high readings after eating cereal and relatively prolonged high sugars overnight with pizza  Has not made any notes about activity level but is generally not doing much physically  Side effects from medications have been: Diarrhea from metformin   CONTINUOUS GLUCOSE MONITORING RECORD INTERPRETATION    Dates of Recording: 11/8 through  01/15/16   Sensor  summary:  Average blood glucose for the period of recording is 194. There was significant variability in day-to-day patterns during the day especially between 10 AM and 11 PM Sensor tracings were completed for all of the days with completed readings between 11/9 and 11/21 Target blood sugar range was 80-140 Blood sugars on a daily average ranged between 155 up to 265   Blood sugars were variably normal to low overnight with gradual trend to higher readings throughout the day until about 10 PM        Glycemic patterns:  Hyperglycemic episodes Occurred on a daily basis at variable times Blood sugars appear to rise sometimes independent of the type of meal Most significant hyperglycemia was present between 8-10 PM  Hypoglycemic episodes occurred periodically overnight with blood sugars below the range on 4 of the nights, most significantly on 11/14 with glucose readings below target 25% of the time      Overnight periods:  Blood sugars showed sharp decline starting at about 11 PM and sometimes going below the target range but mostly level of in the normal to mildly increased range through the night in blood sugars      Preprandial periods:  Blood sugars are mildly high on an average before breakfast but generally over 200 at lunch and supper.  However there was significant variability at suppertime compared to lunchtime       Postprandial periods:  Blood sugars after breakfast were high on a few occasions but did not show consistent relationship with type of food, did increase with cereal but also on 11/13 increased with eating eggs. After lunch blood sugars were not showing any significant peaks Evening blood sugars after meals were mostly high but not showing any spike after meals except when eating rice Blood sugar was higher more gradually after eating pizza on 11/19 and continued to be high through the night, Similar response with eating spaghetti      Hypoglycemia: Overnight as above on at least 3 of the nights between 2 AM-6 AM for variable durations        Glucose monitoring:  done 1-2 times a day         Glucometer: Contour next      Blood Glucose readings by monitor download:  Mean values apply above for all meters except median for One Touch  PRE-MEAL Fasting Lunch Dinner Overnight  Overall  Glucose range: 86-254  129-205  126-374   48-210    Mean/median:  178  250 156  201+/-69    POST-MEAL PC Breakfast PC Lunch PC Dinner  Glucose range:   68-327   Mean/median:         Self-care:   He has seen the dietitian   Meals: 3 meals per day. Breakfast is cereal Or oatmeal, occasionally eggs and toast or grits Lunch can be an sandwich/hamburger, rarely fried chicken or jelly beans The evening meal usually chicken, occasionally fast food or pasta, or small amount of beef or deer meat and vegetables.  Snacks: Popcorn, orange or apple            Exercise:  minimal recently Dietician visit, most recent: 1/16               Weight history: Previously 202-236  Wt Readings from Last 3 Encounters:  01/15/16 228 lb 3.2 oz (103.5 kg)  01/14/16 234 lb (106.1 kg)  01/01/16 225 lb (102.1 kg)   Glycemic control:   Lab Results  Component Value Date   HGBA1C 8.8 01/01/2016   HGBA1C 10.2 (H) 10/11/2015   HGBA1C 9.3 07/04/2015   Lab Results  Component Value Date   MICROALBUR 2.6 (H) 04/04/2015   LDLCALC 103 (H) 01/14/2016   CREATININE 0.99 01/14/2016   CREATININE 0.99 01/14/2016         Medication List       Accurate as of 01/15/16 11:59 PM. Always use your most recent med list.          alprazolam 2 MG tablet Commonly known as:  XANAX Take 1-2 mg by mouth 2 (two) times daily as needed for anxiety.   aspirin 81 MG tablet Take 81 mg by mouth every evening.   atorvastatin 40 MG tablet Commonly known as:  LIPITOR Take 40 mg by mouth every evening.   buPROPion 150 MG 12 hr tablet Commonly known as:  WELLBUTRIN SR Take 150 mg by mouth 2 (two) times daily.   canagliflozin 300 MG Tabs tablet Commonly known as:  INVOKANA Take 1 tablet (300 mg total) by mouth daily before breakfast.   diazepam 5 MG tablet Commonly known as:  VALIUM Take 1 tablet (5 mg total) by mouth every 6 (six) hours as needed for muscle spasms.   esomeprazole 40 MG capsule Commonly known as:  NEXIUM Take 1 capsule (40 mg total) by mouth daily.   gabapentin 600 MG tablet Commonly known as:  NEURONTIN Take 600 mg by mouth at bedtime.   Insulin Lispro 200 UNIT/ML  Sopn Commonly known as:  HUMALOG KWIKPEN Inject 130 Units into the skin 3 (three) times daily. Inject 50 units at breakfast, 30 units at lunch and 50 units at dinner   lisinopril 5 MG tablet Commonly known as:  PRINIVIL,ZESTRIL Take 5 mg by mouth every evening.   metoprolol succinate 25 MG 24 hr tablet Commonly known as:  TOPROL-XL Take 25 mg by mouth at bedtime.   nitroGLYCERIN 0.4 MG SL tablet Commonly known as:  NITROSTAT Place 1 tablet (0.4 mg total) under the tongue every 5 (five) minutes as needed for chest pain.   oxyCODONE-acetaminophen 5-325 MG tablet Commonly known as:  ROXICET Take 1-2 tablets by mouth every 4 (four) hours as needed for severe pain.   pioglitazone 30 MG tablet Commonly known as:  ACTOS Take 1 tablet (30 mg total) by mouth daily.   TOUJEO SOLOSTAR 300 UNIT/ML Sopn Generic drug:  Insulin Glargine INJECT 120 UNITS INTO THE SKIN DAILY BEFORE BREAKFAST.   VICTOZA 18 MG/3ML Sopn Generic drug:  liraglutide Inject 1.8 mg into the skin daily.       Allergies:  Allergies  Allergen Reactions  . Prednisone Other (See Comments)    Run sugar over 600 causes hospitalization    Past Medical History:  Diagnosis Date  . Angina pectoris ([place])   . Arthritis   . Bone spur of left foot   . Chest pain   . COPD (chronic obstructive pulmonary disease) ([employer])   . Coronary artery disease    no stents - some blockage (35% in "widowmaker) - 2014  . Diabetes mellitus without complication ([place])    type 2  . Dysrhythmia    goes fast at times  . GERD (gastroesophageal reflux disease)   . History of bronchitis   . History of hiatal hernia   . History of pneumonia   . Hyperlipidemia   . Hypertension   . Measles   . Mumps   . Near syncope   . Neuropathy (  [place])   . Obesity   . Palpitations   . Pneumonia     Past Surgical History:  Procedure Laterality Date  . ANTERIOR CERVICAL DECOMP/DISCECTOMY FUSION N/A 04/26/2015   Procedure: Anterior Cervical Discectomy  and Fusion - Cervical five-Cervical six with alta interbody;  Surgeon: Kristeen Miss, MD;  Location: [facility] [facility];  Service: Neurosurgery;  Laterality: N/A;  . ANTERIOR CERVICAL DECOMP/DISCECTOMY FUSION N/A 10/21/2015   Procedure: Cervical seven-Thoracic one ANTERIOR CERVICAL DECOMPRESSION/DISKECTOMY/FUSION WITH ALTA PLATE;  Surgeon: Kristeen Miss, MD;  Location: [facility];  Service: Neurosurgery;  Laterality: N/A;  C7-T1 ANTERIOR CERVICAL DECOMPRESSION/DISKECTOMY/FUSION WITH ALTA PLATE  . ANTERIOR CERVICAL DISCECTOMY    . CARDIAC CATHETERIZATION    . CARPAL TUNNEL RELEASE Right 07/19/2015   Procedure: Right Carpal Tunnel Release;  Surgeon: Kristeen Miss, MD;  Location: [place] [facility];  Service: Neurosurgery;  Laterality: Right;  Right Carpal tunnel release  . COLONOSCOPY    . EYE SURGERY Bilateral    cataracts  . FOOT SURGERY Left   . POSTERIOR CERVICAL FUSION/FORAMINOTOMY N/A 07/12/2015   Procedure: Cervical five-six Cervical seven-Thoracic one Posterior cervical fusion with instrumention;  Surgeon: Kristeen Miss, MD;  Location: [facility];  Service: Neurosurgery;  Laterality: N/A;  C5-6 C7-T1 Posterior cervical fusion with lateral mass fixation  . SPINE SURGERY  03/21/13   Lumbar fusion  . ULNAR NERVE TRANSPOSITION Right 07/19/2015   Procedure: Right Ulnar nerve release ;  Surgeon: Kristeen Miss, MD;  Location: [place] [facility];  Service: Neurosurgery;  Laterality: Right;  Right Ulnar nerve release     Family History  Problem Relation Age of Onset  . Diabetes Mother   . Heart attack Mother   . Diabetes Father   . Heart disease Father   . Heart attack Father   . Diabetes Brother   . Hyperlipidemia Other   . Hypertension Other     Social History:  reports that he quit smoking about 9 months ago. His smoking use included Cigarettes. He started smoking about 22 months ago. He has a 6.25 pack-year smoking history. He has never used smokeless tobacco. He reports that he drinks alcohol. He reports  that he does not use drugs.    Review of Systems    Mild hypertension, treated by PCP with low-dose lisinopril and metoprolol  Most recent eye exam was in 1/16       Lipids: As below.  Has been on Lipitor for Control  Recent LDL test over 100       Lab Results  Component Value Date   CHOL 148 01/14/2016   HDL 23 (L) 01/14/2016   LDLCALC 103 (H) 01/14/2016   TRIG 111 01/14/2016   CHOLHDL 6.4 (H) 01/14/2016                       He has a history of left leg  Numbness, tingling and burning and also some in his left foot.  Taking gabapentin high doses for this.  Foot exam in 2/17 showed the following: significantly decreased monofilament sensation in the toes especially left fifth toe, no skin lesions or ulcers on the feet and normal pulses  LABS:    Physical Examination:  BP 140/68   Pulse 62   Ht 5\' 11"  (1.803 m)   Wt 228 lb 3.2 oz (103.5 kg)   SpO2 98%   BMI 31.83 kg/m       ASSESSMENT:  Diabetes type 2, uncontrolled  See history of  present illness for detailed description of his daily regimen, problems identified and blood sugar patterns  He is on large doses of insulin along with other agents including Actos, Invokana and Victoza  His blood sugars are progressively more difficult to control And also has seen above has somewhat variable patterns of blood sugars on his CONTINUOUS glucose MONITORING  He has variable postprandial hyperglycemia but his blood sugars averaging over 200 between 10 AM-midnight with variable patterns and no consistent trend Certain meals make his blood sugars go up but not predictably with higher fat meals Has tendency to drop in blood sugars overnight with the lowest blood sugar averaging 120 between 2-4 AM and hypoglycemia at least on 3 nights  Will forward reports of lipids to PCP  PLAN:  He will discuss the insulin pump with his wife and given him information on the [employer] 670, discussed how this would be effective in blood  sugar control and basic features of this pump He will continue Toujeo for now needs relatively less insulin in the evening and more in the morning Also needs to cover his evening meals with higher doses of insulin because of mostly high readings after supper Humalog doses 60-70 at breakfast, increased to 70-80 at dinnertime Needs to have consistent protein at each meal and probably needs lower fat meals overall  Patient Instructions  Toujeo reduce to 55 units at nite and 100 in am  Humalog 70-80 at supper  Based on sugar before eating  Am 60-70 units   Lunch 50-65 based at mealtime  Protein at each meal   Counseling time on subjects discussed above is over 50% of today's 25 minute visit   Tata Timmins 01/19/2016, 9:12 AM   Note: This office note was prepared with Dragon voice recognition system technology. Any transcriptional errors that result from this process are unintentional.

## 2016-01-20 DIAGNOSIS — Z961 Presence of intraocular lens: Secondary | ICD-10-CM | POA: Diagnosis not present

## 2016-01-20 DIAGNOSIS — E119 Type 2 diabetes mellitus without complications: Secondary | ICD-10-CM | POA: Diagnosis not present

## 2016-01-20 DIAGNOSIS — Z7984 Long term (current) use of oral hypoglycemic drugs: Secondary | ICD-10-CM | POA: Diagnosis not present

## 2016-01-20 DIAGNOSIS — Z794 Long term (current) use of insulin: Secondary | ICD-10-CM | POA: Diagnosis not present
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
## 2016-01-20 MED FILL — TOUJEO SOLOSTAR 300 UNITS/M: 300 | 23 days supply | Qty: 9 | Fill #3

## 2016-01-21 DIAGNOSIS — H16141 Punctate keratitis, right eye: Secondary | ICD-10-CM | POA: Diagnosis not present

## 2016-01-22 ENCOUNTER — Other Ambulatory Visit: Payer: Self-pay | Admitting: Endocrinology

## 2016-01-22 MED FILL — HUMALOG 200 UNITS/ML KWIKPE: 200 | 74 days supply | Qty: 48 | Fill #0

## 2016-01-28 ENCOUNTER — Encounter: Payer: 59 | Admitting: Nutrition

## 2016-01-28 ENCOUNTER — Ambulatory Visit: Payer: 59 | Admitting: Endocrinology

## 2016-02-12 MED FILL — BUPROPION SR 150 MG TABLET: 150 | 30 days supply | Qty: 60 | Fill #4

## 2016-02-26 MED FILL — CONTOUR NEXT STRIPS: 30 days supply | Qty: 150 | Fill #2

## 2016-02-26 MED FILL — BD PEN NDL NANO 32GX5/32: 32G X 4 MM | 25 days supply | Qty: 100 | Fill #1

## 2016-02-26 MED FILL — BD PEN NDL NANO 32GX5/32": 32G X 4 MM | 25 days supply | Qty: 100 | Fill #1

## 2016-02-26 MED FILL — TOUJEO SOLOSTAR 300 UNITS/M: 300 | 23 days supply | Qty: 9 | Fill #4

## 2016-03-04 ENCOUNTER — Ambulatory Visit (AMBULATORY_SURGERY_CENTER): Payer: Self-pay | Admitting: *Deleted

## 2016-03-04 VITALS — Ht 70.0 in | Wt 239.0 lb

## 2016-03-04 DIAGNOSIS — Z86010 Personal history of colonic polyps: Secondary | ICD-10-CM

## 2016-03-04 MED ORDER — NA SULFATE-K SULFATE-MG SULF 17.5-3.13-1.6 GM/177ML PO SOLN: 1.0000 | Freq: Once | ORAL | 0 refills | Status: AC

## 2016-03-04 MED FILL — SUPREP BOWEL PREP KIT: 17.5-3.13-1 | 1 days supply | Qty: 354 | Fill #0

## 2016-03-04 NOTE — Progress Notes (Signed)
Denies allergies to eggs or soy products. Denies complications with sedation or anesthesia. Denies O2 use. Denies use of diet or weight loss medications.  Emmi instructions given for colonoscopy.  

## 2016-03-09 DIAGNOSIS — I2510 Atherosclerotic heart disease of native coronary artery without angina pectoris: Secondary | ICD-10-CM | POA: Diagnosis not present

## 2016-03-09 DIAGNOSIS — R233 Spontaneous ecchymoses: Secondary | ICD-10-CM | POA: Diagnosis not present

## 2016-03-09 DIAGNOSIS — E1065 Type 1 diabetes mellitus with hyperglycemia: Secondary | ICD-10-CM | POA: Diagnosis not present

## 2016-03-09 DIAGNOSIS — F419 Anxiety disorder, unspecified: Secondary | ICD-10-CM | POA: Diagnosis not present

## 2016-03-09 DIAGNOSIS — J069 Acute upper respiratory infection, unspecified: Secondary | ICD-10-CM | POA: Diagnosis not present

## 2016-03-09 DIAGNOSIS — E1140 Type 2 diabetes mellitus with diabetic neuropathy, unspecified: Secondary | ICD-10-CM | POA: Diagnosis not present

## 2016-03-09 MED FILL — ALPRAZolam 2 MG TABS: 2 | 30 days supply | Qty: 90 | Fill #0

## 2016-03-11 MED FILL — ATORVASTATIN 40 MG TABLET: 40 | 90 days supply | Qty: 90 | Fill #1

## 2016-03-11 MED FILL — LISINOPRIL 5 MG TAB: 5 | 90 days supply | Qty: 90 | Fill #1

## 2016-03-12 ENCOUNTER — Other Ambulatory Visit: Payer: Medicare Other

## 2016-03-16 ENCOUNTER — Ambulatory Visit: Payer: Medicare Other | Admitting: Endocrinology

## 2016-03-18 ENCOUNTER — Encounter: Payer: Self-pay | Admitting: Gastroenterology

## 2016-03-18 ENCOUNTER — Ambulatory Visit (AMBULATORY_SURGERY_CENTER): Payer: 59 | Admitting: Gastroenterology

## 2016-03-18 VITALS — BP 132/69 | HR 67 | Temp 98.0°F | Resp 11 | Ht 70.0 in | Wt 239.0 lb

## 2016-03-18 DIAGNOSIS — D123 Benign neoplasm of transverse colon: Secondary | ICD-10-CM

## 2016-03-18 DIAGNOSIS — K635 Polyp of colon: Secondary | ICD-10-CM | POA: Diagnosis not present

## 2016-03-18 DIAGNOSIS — Z860101 Personal history of adenomatous and serrated colon polyps: Secondary | ICD-10-CM

## 2016-03-18 DIAGNOSIS — Z86010 Personal history of colonic polyps: Secondary | ICD-10-CM | POA: Diagnosis present

## 2016-03-18 DIAGNOSIS — I2510 Atherosclerotic heart disease of native coronary artery without angina pectoris: Secondary | ICD-10-CM | POA: Diagnosis not present

## 2016-03-18 DIAGNOSIS — D124 Benign neoplasm of descending colon: Secondary | ICD-10-CM

## 2016-03-18 DIAGNOSIS — J449 Chronic obstructive pulmonary disease, unspecified: Secondary | ICD-10-CM | POA: Diagnosis not present

## 2016-03-18 DIAGNOSIS — D129 Benign neoplasm of anus and anal canal: Secondary | ICD-10-CM

## 2016-03-18 DIAGNOSIS — E669 Obesity, unspecified: Secondary | ICD-10-CM | POA: Diagnosis not present

## 2016-03-18 DIAGNOSIS — D120 Benign neoplasm of cecum: Secondary | ICD-10-CM

## 2016-03-18 DIAGNOSIS — D128 Benign neoplasm of rectum: Secondary | ICD-10-CM

## 2016-03-18 DIAGNOSIS — E119 Type 2 diabetes mellitus without complications: Secondary | ICD-10-CM | POA: Diagnosis not present

## 2016-03-18 DIAGNOSIS — K621 Rectal polyp: Secondary | ICD-10-CM

## 2016-03-18 DIAGNOSIS — D125 Benign neoplasm of sigmoid colon: Secondary | ICD-10-CM

## 2016-03-18 DIAGNOSIS — I10 Essential (primary) hypertension: Secondary | ICD-10-CM | POA: Diagnosis not present

## 2016-03-18 LAB — GLUCOSE, CAPILLARY
GLUCOSE-CAPILLARY: 134 mg/dL — AB (ref 65–99)
GLUCOSE-CAPILLARY: 102 mg/dL — AB (ref 65–99)

## 2016-03-18 MED ORDER — SODIUM CHLORIDE 0.9 % IV SOLN: 500.0000 mL | INTRAVENOUS | Status: AC

## 2016-03-18 NOTE — Progress Notes (Signed)
To PACU, vss patent aw report to rn 

## 2016-03-18 NOTE — Op Note (Signed)
[place] Patient Name: Arthur Mata Procedure Date: 03/18/2016 10:37 AM MRN: [id number] Endoscopist: Ladene Artist , MD Age: 61 Referring MD:  Date of Birth: [birth year] Gender: Male Account #: [id number] Procedure:                Colonoscopy Indications:              Surveillance: Personal history of adenomatous                            polyps on last colonoscopy > 3 years ago Medicines:                Monitored Anesthesia Care Procedure:                Pre-Anesthesia Assessment:                           - Prior to the procedure, a History and Physical                            was performed, and patient medications and                            allergies were reviewed. The patient's tolerance of                            previous anesthesia was also reviewed. The risks                            and benefits of the procedure and the sedation                            options and risks were discussed with the patient.                            All questions were answered, and informed consent                            was obtained. Prior Anticoagulants: The patient has                            taken no previous anticoagulant or antiplatelet                            agents. ASA Grade Assessment: III - A patient with                            severe systemic disease. After reviewing the risks                            and benefits, the patient was deemed in                            satisfactory condition to undergo the procedure.  After obtaining informed consent, the colonoscope                            was passed under direct vision. Throughout the                            procedure, the patient's blood pressure, pulse, and                            oxygen saturations were monitored continuously. The                            Model PCF-H190L [id number]) scope was introduced                            through the anus and  advanced to the the cecum,                            identified by appendiceal orifice and ileocecal                            valve. The ileocecal valve, appendiceal orifice,                            and rectum were photographed. The quality of the                            bowel preparation was good. The colonoscopy was                            performed without difficulty. The patient tolerated                            the procedure well. Scope In: 10:50:54 AM Scope Out: 11:12:42 AM Scope Withdrawal Time: 0 hours 19 minutes 1 second  Total Procedure Duration: 0 hours 21 minutes 48 seconds  Findings:                 The perianal and digital rectal examinations were                            normal.                           A 5 mm polyp was found in the transverse colon. The                            polyp was sessile. The polyp was removed with a                            cold biopsy forceps. The polyp was removed with a                            cold snare. Resection and retrieval were complete.  23 sessile polyps were found in the sigmoid colon.                            The polyps were 5 to 7 mm in size. These polyps                            were removed with a cold snare. Resection and                            retrieval were complete.                           Six sessile polyps were found in the rectum (2),                            descending colon (1) and cecum (3). The polyps were                            5 to 7 mm in size. These polyps were removed with a                            cold snare. Resection and retrieval were complete.                           The exam was otherwise without abnormality on                            direct and retroflexion views. Complications:            No immediate complications. Estimated blood loss:                            None. Estimated Blood Loss:     Estimated blood loss:  none. Impression:               - One 5 mm polyp in the transverse colon, removed                            with a cold snare and removed with a cold biopsy                            forceps. Resected and retrieved.                           - Twenty-three 5 to 7 mm polyps in the sigmoid                            colon, removed with a cold snare. Resected and                            retrieved.                           - Six 5 to  7 mm polyps in the rectum, in the                            descending colon and in the cecum, removed with a                            cold snare. Resected and retrieved.                           - The examination was otherwise normal on direct                            and retroflexion views. Recommendation:           - Repeat colonoscopy in 1 - 3 for surveillance                            pending pathology review.                           - Patient has a contact number available for                            emergencies. The signs and symptoms of potential                            delayed complications were discussed with the                            patient. Return to normal activities tomorrow.                            Written discharge instructions were provided to the                            patient.                           - Resume previous diet.                           - Continue present medications.                           - Await pathology results.                           - No aspirin, ibuprofen, naproxen, or other                            non-steroidal anti-inflammatory drugs for 2 weeks                            after polyp removal. Ladene Artist, MD 03/18/2016 11:19:03 AM This report has been signed electronically.

## 2016-03-18 NOTE — Patient Instructions (Signed)
Impression/Recommendations:  [facility] handout given to patient.  Repeat colonoscopy in 1-3 years based on pathology results.  No aspirin, Ibuprofen, naproxen, or other NSAID drugs for 2 weeks (until Feb. 8, 2018).  YOU HAD AN ENDOSCOPIC PROCEDURE TODAY AT [facility]:   Refer to the procedure report that was given to you for any specific questions about what was found during the examination.  If the procedure report does not answer your questions, please call your gastroenterologist to clarify.  If you requested that your care partner not be given the details of your procedure findings, then the procedure report has been included in a sealed envelope for you to review at your convenience later.  YOU SHOULD EXPECT: Some feelings of bloating in the abdomen. Passage of more gas than usual.  Walking can help get rid of the air that was put into your GI tract during the procedure and reduce the bloating. If you had a lower endoscopy (such as a colonoscopy or flexible sigmoidoscopy) you may notice spotting of blood in your stool or on the toilet paper. If you underwent a bowel prep for your procedure, you may not have a normal bowel movement for a few days.  Please Note:  You might notice some irritation and congestion in your nose or some drainage.  This is from the oxygen used during your procedure.  There is no need for concern and it should clear up in a day or so.  SYMPTOMS TO REPORT IMMEDIATELY:   Following lower endoscopy (colonoscopy or flexible sigmoidoscopy):  Excessive amounts of blood in the stool  Significant tenderness or worsening of abdominal pains  Swelling of the abdomen that is new, acute  Fever of 100F or higher For urgent or emergent issues, a gastroenterologist can be reached at any hour by calling [phone].   DIET:  We do recommend a small meal at first, but then you may proceed to your regular diet.  Drink plenty of fluids but you should avoid alcoholic  beverages for 24 hours.  ACTIVITY:  You should plan to take it easy for the rest of today and you should NOT DRIVE or use heavy machinery until tomorrow (because of the sedation medicines used during the test).    FOLLOW UP: Our staff will call the number listed on your records the next business day following your procedure to check on you and address any questions or concerns that you may have regarding the information given to you following your procedure. If we do not reach you, we will leave a message.  However, if you are feeling well and you are not experiencing any problems, there is no need to return our call.  We will assume that you have returned to your regular daily activities without incident.  If any biopsies were taken you will be contacted by phone or by letter within the next 1-3 weeks.  Please call us at [phone] if you have not heard about the biopsies in 3 weeks.    SIGNATURES/CONFIDENTIALITY: You and/or your care partner have signed paperwork which will be entered into your electronic medical record.  These signatures attest to the fact that that the information above on your After Visit Summary has been reviewed and is understood.  Full responsibility of the confidentiality of this discharge information lies with you and/or your care-partner.

## 2016-03-18 NOTE — Progress Notes (Signed)
Called to room to assist during endoscopic procedure.  Patient ID and intended procedure confirmed with present staff. Received instructions for my participation in the procedure from the performing physician.  

## 2016-03-19 ENCOUNTER — Telehealth: Payer: Self-pay

## 2016-03-19 NOTE — Telephone Encounter (Signed)
  Follow up Call-  Call back number 03/18/2016  Post procedure Call Back phone  # [phone]  Permission to leave phone message Yes     Patient questions:  Do you have a fever, pain , or abdominal swelling? No. Pain Score  0 *  Have you tolerated food without any problems? Yes.    Have you been able to return to your normal activities? Yes.    Do you have any questions about your discharge instructions: Diet   No. Medications  No. Follow up visit  No.  Do you have questions or concerns about your Care? No.  Actions: * If pain score is 4 or above: No action needed, pain <4.

## 2016-03-26 ENCOUNTER — Encounter: Payer: Self-pay | Admitting: Gastroenterology

## 2016-03-26 MED FILL — PIOGLITAZONE HCL 30 MG TAB: 30 | 30 days supply | Qty: 30 | Fill #1

## 2016-03-27 ENCOUNTER — Other Ambulatory Visit (INDEPENDENT_AMBULATORY_CARE_PROVIDER_SITE_OTHER): Payer: 59

## 2016-03-27 ENCOUNTER — Telehealth: Payer: Self-pay | Admitting: Endocrinology

## 2016-03-27 DIAGNOSIS — E1165 Type 2 diabetes mellitus with hyperglycemia: Secondary | ICD-10-CM

## 2016-03-27 DIAGNOSIS — Z794 Long term (current) use of insulin: Secondary | ICD-10-CM | POA: Diagnosis not present

## 2016-03-27 LAB — COMPREHENSIVE METABOLIC PANEL
ALBUMIN: 3.7 g/dL (ref 3.5–5.2)
ALT: 36 U/L (ref 0–53)
AST: 36 U/L (ref 0–37)
Alkaline Phosphatase: 79 U/L (ref 39–117)
BILIRUBIN TOTAL: 0.8 mg/dL (ref 0.2–1.2)
BUN: 14 mg/dL (ref 6–23)
CO2: 30 mEq/L (ref 19–32)
Calcium: 9.2 mg/dL (ref 8.4–10.5)
Chloride: 104 mEq/L (ref 96–112)
Creatinine, Ser: 1.07 mg/dL (ref 0.40–1.50)
GFR: 75.16 mL/min (ref >60.00)
Glucose, Bld: 131 mg/dL — ABNORMAL HIGH (ref 70–99)
POTASSIUM: 4.4 meq/L (ref 3.5–5.1)
SODIUM: 138 meq/L (ref 135–145)
TOTAL PROTEIN: 6.7 g/dL (ref 6.0–8.3)

## 2016-03-27 LAB — MICROALBUMIN / CREATININE URINE RATIO
CREATININE, U: 193.3 mg/dL
Microalb Creat Ratio: 0.9 mg/g (ref 0.0–30.0)
Microalb, Ur: 1.8 mg/dL (ref 0.0–1.9)

## 2016-03-27 LAB — HEMOGLOBIN A1C: HEMOGLOBIN A1C: 8.7 % — AB (ref 4.6–6.5)

## 2016-03-27 LAB — GLUCOSE, RANDOM: Glucose, Bld: 131 mg/dL — ABNORMAL HIGH (ref 70–99)

## 2016-03-27 NOTE — Telephone Encounter (Signed)
The wellness program is sending the message that he is having low sugars at night twice a week.  He is supposed to be taking 55 units of Toujeo at night, please find out how much he is taking and how much his fasting readings are.  If he is getting low sugars regularly can reduce the Toujeo by 5 units

## 2016-03-27 NOTE — Telephone Encounter (Signed)
I contacted the patient.  Fasting blood sugar are as follows: 03/24/2016: 101  03/25/2016: 70  03/26/2016: 140  03/27/2016: 110  Patient stated he is taking Toujeo 80 units in the morning and 80 units in the evening and Humalog 50 units at breakfast, 30 at lunch and 50 units with supper.   Please advise on how to proceed.

## 2016-03-27 NOTE — Telephone Encounter (Signed)
I contacted the patient and advised of new directions. Patient voiced understanding and had no further questions at this time.

## 2016-03-27 NOTE — Telephone Encounter (Signed)
He needs to cut back his Toujeo to 70 units in the evening and let me know if he is having a low sugars at night

## 2016-04-01 ENCOUNTER — Ambulatory Visit (INDEPENDENT_AMBULATORY_CARE_PROVIDER_SITE_OTHER): Payer: 59 | Admitting: Endocrinology

## 2016-04-01 VITALS — BP 112/68 | HR 52 | Wt 231.0 lb

## 2016-04-01 DIAGNOSIS — E1165 Type 2 diabetes mellitus with hyperglycemia: Secondary | ICD-10-CM

## 2016-04-01 DIAGNOSIS — Z794 Long term (current) use of insulin: Secondary | ICD-10-CM | POA: Diagnosis not present

## 2016-04-01 MED FILL — ACCU-CHEK GUIDE TEST STRIP: 30 days supply | Qty: 100 | Fill #0

## 2016-04-01 NOTE — Patient Instructions (Signed)
50 u at Bfst, 70 at supper

## 2016-04-01 NOTE — Progress Notes (Signed)
Patient ID: Arthur Mata, male   DOB: August 07, 1955, 61 y.o.   MRN: [id number]                  Reason for Appointment:  Follow-up for Type 2 Diabetes  Referring physician: Girtha Rm  History of Present Illness:          Diagnosis: Type 2 diabetes mellitus, date of diagnosis: 12/2011      Past history:  At the time of diagnosis he had symptoms of frequent urination, thirst and blurred vision He thinks his blood sugar was about 600.  His physician started him on Novolog and also Metformin  Details of initial treatment are not available but he also apparently took Victoza at some point before insulin and he does not think it worked After some time he was also given Levemir in addition to his NovoLog His blood sugars were somewhat better controlled after some time and were averaging about 200. He thinks that because of diarrhea his metformin was stopped after a few months.  Apparently in 10/2012 he had epidural steroid and with this his blood sugar went up to 600.  However he thinks his blood sugars had been persistently poorly controlled since then. Actos was started in 06/2012 without much benefit. He was also tried on Invokana for about 3 months and reportedly had no benefit from this. He was started on U-500 instead of Levemir and NovoLog in late 2014 His blood sugars had been poorly controlled with A1c ranging from 10.5-12.5 and prior to his initial consultation. On his initial consultation he was switched from the U-500 insulin to Toujeo 120 units hs and Humalog U-200  Recent history   INSULIN regimen:  Toujeo 80 units a.m. And 75 units p.m. Humalog U-200 before meals = 60-40-60   Non-insulin hypoglycemic drugs the patient is taking: Invokana 300 mg daily, Victoza 1.8 mg daily And Actos 30 mg     A1c has been persistently high with last A1c 8.7  Side effects from medications have been: Diarrhea from metformin  Current management, blood sugar patterns and problems identified:  He does  not appear to be changing his insulin doses as directed and continues to take the same doses as before  Even though he had marked increase in blood sugars after evening meal and he was told to increase his suppertime Humalog he still takes only 60 units  He was also told to cut back his Toujeo down to 55 because of nocturnal hypoglycemia seen on his continuous glucose monitoring but he continues to take 80 units  He was having low blood sugars overnight for at least 4 or 5 nights between 1/28 and 03/29/16  With using 75 Toujeo in the last couple of days morning sugars are relatively higher  However he continues to be getting to check sugars AFTER his evening meal when he tends to have the highest readings, previously averaging over 200 on the CGM  He has done some readings after lunch which appear to be fairly good, lowest reading 49 at 5 PM  FASTING readings except for a few days have been mildly increased  He was having only popcorn as a bedtime snack until recently  He thinks he is normally eating relatively low fat meals but does not adjust his suppertime dose based on what he is eating  BEDTIME readings have been done only 3 times and these are mostly high He still refuses to consider the insulin pump  Glucose monitoring:  done  1-2 times a day         Glucometer:  Accu-Chek  Blood Glucose readings by monitor download:  Mean values apply above for all meters except median for One Touch  PRE-MEAL Fasting Lunch Dinner Bedtime Overall  Glucose range: 90-223     104-241   Mean/median: 142   194 125   POST-MEAL PC Breakfast PC lunch  Overnight  Glucose range:   45-59   Mean/median:  128     Self-care:     Meals: 3 meals per day. Breakfast is cereal Or oatmeal, occasionally eggs and toast or grits Lunch can be an sandwich/hamburger, rarely fried chicken or jelly beans The evening meal usually chicken, occasionally fast food or pasta, or small amount of beef or deer meat and  vegetables.   Snacks: Popcorn, orange or apple            Exercise:  He has been trying to be a little more recently, he thinks she is getting about 4000 steps daily Dietician visit, most recent: 1/16               Weight history: Previously 202-236  Wt Readings from Last 3 Encounters:  04/01/16 231 lb (104.8 kg)  03/18/16 239 lb (108.4 kg)  03/04/16 239 lb (108.4 kg)   Glycemic control:   Lab Results  Component Value Date   HGBA1C 8.7 (H) 03/27/2016   HGBA1C 8.8 01/01/2016   HGBA1C 10.2 (H) 10/11/2015   Lab Results  Component Value Date   MICROALBUR 1.8 03/27/2016   LDLCALC 103 (H) 01/14/2016   CREATININE 1.07 03/27/2016       Allergies as of 04/01/2016      Reactions   Prednisone Other (See Comments)   Run sugar over 600 causes hospitalization      Medication List       Accurate as of 04/01/16  9:53 AM. Always use your most recent med list.          alprazolam 2 MG tablet Commonly known as:  XANAX Take 1-2 mg by mouth 2 (two) times daily as needed for anxiety.   aspirin 81 MG tablet Take 81 mg by mouth every evening.   atorvastatin 40 MG tablet Commonly known as:  LIPITOR Take 40 mg by mouth every evening.   buPROPion 150 MG 12 hr tablet Commonly known as:  WELLBUTRIN SR Take 150 mg by mouth 2 (two) times daily.   diazepam 5 MG tablet Commonly known as:  VALIUM Take 1 tablet (5 mg total) by mouth every 6 (six) hours as needed for muscle spasms.   esomeprazole 40 MG capsule Commonly known as:  NEXIUM Take 1 capsule (40 mg total) by mouth daily.   HUMALOG KWIKPEN 200 UNIT/ML Sopn Generic drug:  Insulin Lispro INJECT 50 UNITS AT BREAKFAST, 30 UNITS AT LUNCH, AND 50 UNITS AT DINNER. A MAX DAILY DOSE OF 130 UNITS   lisinopril 5 MG tablet Commonly known as:  PRINIVIL,ZESTRIL Take 5 mg by mouth every evening.   metoprolol succinate 25 MG 24 hr tablet Commonly known as:  TOPROL-XL Take 25 mg by mouth at bedtime.   nitroGLYCERIN 0.4 MG SL  tablet Commonly known as:  NITROSTAT Place 1 tablet (0.4 mg total) under the tongue every 5 (five) minutes as needed for chest pain.   oxyCODONE-acetaminophen 5-325 MG tablet Commonly known as:  ROXICET Take 1-2 tablets by mouth every 4 (four) hours as needed for severe pain.   pioglitazone 30 MG tablet  Commonly known as:  ACTOS Take 1 tablet (30 mg total) by mouth daily.   TOUJEO SOLOSTAR 300 UNIT/ML Sopn Generic drug:  Insulin Glargine INJECT 120 UNITS INTO THE SKIN DAILY BEFORE BREAKFAST.       Allergies:  Allergies  Allergen Reactions  . Prednisone Other (See Comments)    Run sugar over 600 causes hospitalization    Past Medical History:  Diagnosis Date  . Angina pectoris ([place])   . Arthritis   . Bone spur of left foot   . Chest pain   . COPD (chronic obstructive pulmonary disease) ([place])   . Coronary artery disease    no stents - some blockage (35% in "widowmaker) - 2014  . Diabetes mellitus without complication ([place])    type 2  . Dysrhythmia    goes fast at times  . GERD (gastroesophageal reflux disease)   . History of bronchitis   . History of hiatal hernia   . History of pneumonia   . Hyperlipidemia   . Hypertension   . Measles   . Mumps   . Near syncope   . Neuropathy ([place])   . Obesity   . Palpitations   . Pneumonia     Past Surgical History:  Procedure Laterality Date  . ANTERIOR CERVICAL DECOMP/DISCECTOMY FUSION N/A 04/26/2015   Procedure: Anterior Cervical Discectomy and Fusion - Cervical five-Cervical six with alta interbody;  Surgeon: Kristeen Miss, MD;  Location: [facility];  Service: Neurosurgery;  Laterality: N/A;  . ANTERIOR CERVICAL DECOMP/DISCECTOMY FUSION N/A 10/21/2015   Procedure: Cervical seven-Thoracic one ANTERIOR CERVICAL DECOMPRESSION/DISKECTOMY/FUSION WITH ALTA PLATE;  Surgeon: Kristeen Miss, MD;  Location: [facility];  Service: Neurosurgery;  Laterality: N/A;  C7-T1 ANTERIOR CERVICAL DECOMPRESSION/DISKECTOMY/FUSION WITH ALTA PLATE   . ANTERIOR CERVICAL DISCECTOMY    . CARDIAC CATHETERIZATION    . CARPAL TUNNEL RELEASE Right 07/19/2015   Procedure: Right Carpal Tunnel Release;  Surgeon: Kristeen Miss, MD;  Location: [facility];  Service: Neurosurgery;  Laterality: Right;  Right Carpal tunnel release  . COLONOSCOPY    . EYE SURGERY Bilateral    cataracts  . FOOT SURGERY Left   . POSTERIOR CERVICAL FUSION/FORAMINOTOMY N/A 07/12/2015   Procedure: Cervical five-six Cervical seven-Thoracic one Posterior cervical fusion with instrumention;  Surgeon: Kristeen Miss, MD;  Location: [facility];  Service: Neurosurgery;  Laterality: N/A;  C5-6 C7-T1 Posterior cervical fusion with lateral mass fixation  . SPINE SURGERY  03/21/13   Lumbar fusion  . ULNAR NERVE TRANSPOSITION Right 07/19/2015   Procedure: Right Ulnar nerve release ;  Surgeon: Kristeen Miss, MD;  Location: [facility];  Service: Neurosurgery;  Laterality: Right;  Right Ulnar nerve release     Family History  Problem Relation Age of Onset  . Diabetes Mother   . Heart attack Mother   . Diabetes Father   . Heart disease Father   . Heart attack Father   . Diabetes Brother   . Hyperlipidemia Other   . Hypertension Other   . Colon cancer Neg Hx     Social History:  reports that he quit smoking about a year ago. His smoking use included Cigarettes. He started smoking about 2 years ago. He has a 6.25 pack-year smoking history. He has never used smokeless tobacco. He reports that he drinks alcohol. He reports that he does not use drugs.    Review of Systems    The following is a copy of the previous note:  Mild hypertension, treated by  PCP with low-dose lisinopril and metoprolol  Most recent eye exam was in 1/16       Lipids: As below.  Has been on Lipitor for Control  Recent LDL test over 100       Lab Results  Component Value Date   CHOL 148 01/14/2016   HDL 23 (L) 01/14/2016   LDLCALC 103 (H) 01/14/2016   TRIG 111 01/14/2016   CHOLHDL 6.4 (H)  01/14/2016                       He has a history of left leg  Numbness, tingling and burning and also some in his left foot.  Taking gabapentin high doses for this.  Foot exam in 2/17 showed the following: significantly decreased monofilament sensation in the toes especially left fifth toe, no skin lesions or ulcers on the feet and normal pulses  LABS:    Physical Examination:  BP 112/68   Pulse (!) 52   Wt 231 lb (104.8 kg)   SpO2 97%   BMI 33.15 kg/m       ASSESSMENT:  Diabetes type 2, uncontrolled  See history of present illness for detailed description of his daily regimen, problems identified and blood sugar patterns  He is on large doses of Basal bolus insulin along with other agents: Taking Actos, Invokana and Victoza  His blood sugars are  still very difficult to control adequately Previous conditions glucose monitoring record was reviewed again with patient A1c is 8.7  Although he would benefit significantly from using insulin pump especially the 670 [employer] he does not want to use the pump with the tubing as he thinks he may pull it out. As before he probably still has tendency to low sugars overnight and progressively high readings later in the day Not monitoring adequately after supper and probably not getting enough insulin to cover his evening meal most of the time  Today he was feeling hypoglycemic in the office with taking 60 units of Humalog for breakfast  PLAN:   He needs to consider the insulin pump especially the Omnipod if he does not want to do the insulin pump with the tubing, he went up to the nurse educator He will also consider using the freestyle LIBRE system for CGM if covered and he was given information on this, discussed how this would be helpful Emphasized the need to take more insulin at suppertime as she is likely to be having persistent hyperglycemia He needs to also continue to reduce his overnight Toujeo if fasting readings are  overnight readings are low He will take at least 70 units of insulin at suppertime for his meal Try taking only 50 at breakfast and check more readings after breakfast also  More consistent monitoring at various times as discussed  Patient Instructions  50 u at [place], 70 at supper   Counseling time on subjects discussed above is over 50% of today's 25 minute visit   Nakyla Bracco 04/01/2016, 9:53 AM   Note: This office note was prepared with Estate agent. Any transcriptional errors that result from this process are unintentional.

## 2016-04-08 ENCOUNTER — Other Ambulatory Visit: Payer: Self-pay | Admitting: Internal Medicine

## 2016-04-08 MED FILL — METOPROLOL SUCC ER 25 MG TA: 25 | 90 days supply | Qty: 90 | Fill #1

## 2016-04-08 MED FILL — BUPROPION SR 150 MG TABLET: 150 | 30 days supply | Qty: 60 | Fill #5

## 2016-04-08 MED FILL — ALPRAZolam 2 MG TABS: 2 | 30 days supply | Qty: 90 | Fill #1

## 2016-04-08 NOTE — Telephone Encounter (Signed)
Rx(s) sent to pharmacy electronically.  

## 2016-04-16 DIAGNOSIS — E1065 Type 1 diabetes mellitus with hyperglycemia: Secondary | ICD-10-CM | POA: Diagnosis not present

## 2016-04-16 DIAGNOSIS — R69 Illness, unspecified: Secondary | ICD-10-CM | POA: Diagnosis not present

## 2016-04-16 DIAGNOSIS — J40 Bronchitis, not specified as acute or chronic: Secondary | ICD-10-CM | POA: Diagnosis not present

## 2016-04-16 DIAGNOSIS — Z87891 Personal history of nicotine dependence: Secondary | ICD-10-CM | POA: Diagnosis not present

## 2016-04-16 MED FILL — OSELTAMIVIR PHOS 75 MG CAP: 75 | 5 days supply | Qty: 10 | Fill #0

## 2016-04-16 MED FILL — TOUJEO SOLOSTAR 300 UNITS/M: 300 | 23 days supply | Qty: 9 | Fill #5

## 2016-04-16 MED FILL — DOXYCYCLINE HYC 100 MG CAP: 100 | 10 days supply | Qty: 20 | Fill #0

## 2016-04-22 DIAGNOSIS — J40 Bronchitis, not specified as acute or chronic: Secondary | ICD-10-CM | POA: Diagnosis not present

## 2016-04-22 DIAGNOSIS — R69 Illness, unspecified: Secondary | ICD-10-CM | POA: Diagnosis not present

## 2016-05-01 MED FILL — ESOMEPRAZOLE MAGNESIUM 40 M: 40 | 30 days supply | Qty: 30 | Fill #0

## 2016-05-01 MED FILL — FLUCONAZOLE 150 MG TABLET: 150 | 1 days supply | Qty: 1 | Fill #0

## 2016-05-05 MED FILL — PIOGLITAZONE HCL 30 MG TAB: 30 | 30 days supply | Qty: 30 | Fill #2

## 2016-05-25 ENCOUNTER — Other Ambulatory Visit (INDEPENDENT_AMBULATORY_CARE_PROVIDER_SITE_OTHER): Payer: 59

## 2016-05-25 DIAGNOSIS — Z794 Long term (current) use of insulin: Secondary | ICD-10-CM

## 2016-05-25 DIAGNOSIS — E1165 Type 2 diabetes mellitus with hyperglycemia: Secondary | ICD-10-CM

## 2016-05-25 LAB — GLUCOSE, RANDOM: Glucose, Bld: 192 mg/dL — ABNORMAL HIGH (ref 70–99)

## 2016-05-26 DIAGNOSIS — E7800 Pure hypercholesterolemia, unspecified: Secondary | ICD-10-CM | POA: Diagnosis not present

## 2016-05-26 DIAGNOSIS — E1065 Type 1 diabetes mellitus with hyperglycemia: Secondary | ICD-10-CM | POA: Diagnosis not present

## 2016-05-26 DIAGNOSIS — I2510 Atherosclerotic heart disease of native coronary artery without angina pectoris: Secondary | ICD-10-CM | POA: Diagnosis not present

## 2016-05-26 DIAGNOSIS — L03116 Cellulitis of left lower limb: Secondary | ICD-10-CM | POA: Diagnosis not present

## 2016-05-26 LAB — FRUCTOSAMINE: FRUCTOSAMINE: 288 umol/L — AB (ref 0–285)

## 2016-05-26 MED FILL — ROSUVASTATIN CALCIUM 10 MG: 10 | 90 days supply | Qty: 90 | Fill #0

## 2016-05-26 MED FILL — CEPHALEXIN 500 MG CAPSULE: 500 | 10 days supply | Qty: 40 | Fill #0

## 2016-05-28 ENCOUNTER — Ambulatory Visit: Payer: Medicare Other | Admitting: Endocrinology

## 2016-06-01 ENCOUNTER — Ambulatory Visit (INDEPENDENT_AMBULATORY_CARE_PROVIDER_SITE_OTHER): Payer: 59 | Admitting: Endocrinology

## 2016-06-01 ENCOUNTER — Encounter: Payer: Self-pay | Admitting: Endocrinology

## 2016-06-01 VITALS — BP 118/66 | HR 60 | Ht 70.0 in | Wt 239.0 lb

## 2016-06-01 DIAGNOSIS — Z794 Long term (current) use of insulin: Secondary | ICD-10-CM | POA: Diagnosis not present

## 2016-06-01 DIAGNOSIS — E1165 Type 2 diabetes mellitus with hyperglycemia: Secondary | ICD-10-CM | POA: Diagnosis not present

## 2016-06-01 MED FILL — ESOMEPRAZOLE MAGNESIUM 40 M: 40 | 30 days supply | Qty: 30 | Fill #1

## 2016-06-01 MED FILL — PIOGLITAZONE HCL 30 MG TAB: 30 | 30 days supply | Qty: 30 | Fill #3

## 2016-06-01 NOTE — Patient Instructions (Addendum)
Check on Dexcom system and Omnipod pump  Take 55 at Bfst and and 45 at lunch

## 2016-06-01 NOTE — Progress Notes (Signed)
Patient ID: Arthur Mata, male   DOB: 12/19/1955, 61 y.o.   MRN: [id number]                  Reason for Appointment:  Follow-up for Type 2 Diabetes  Referring physician: Girtha Rm  History of Present Illness:          Diagnosis: Type 2 diabetes mellitus, date of diagnosis: 12/2011      Past history:  At the time of diagnosis he had symptoms of frequent urination, thirst and blurred vision He thinks his blood sugar was about 600.  His physician started him on Novolog and also Metformin  Details of initial treatment are not available but he also apparently took Victoza at some point before insulin and he does not think it worked After some time he was also given Levemir in addition to his NovoLog His blood sugars were somewhat better controlled after some time and were averaging about 200. He thinks that because of diarrhea his metformin was stopped after a few months.  Apparently in 10/2012 he had epidural steroid and with this his blood sugar went up to 600.  However he thinks his blood sugars had been persistently poorly controlled since then. Actos was started in 06/2012 without much benefit. He was also tried on Invokana for about 3 months and reportedly had no benefit from this. He was started on U-500 instead of Levemir and NovoLog in late 2014 His blood sugars had been poorly controlled with A1c ranging from 10.5-12.5 and prior to his initial consultation. On his initial consultation he was switched from the U-500 insulin to Toujeo 120 units hs and Humalog U-200  Recent history   INSULIN regimen:  Toujeo 80 units a.m. and 75 units p.m. Humalog U-200 before meals = 60-40-60   Non-insulin hypoglycemic drugs the patient is taking: Invokana 300 mg daily, Victoza 1.8 mg daily And Actos 30 mg     A1c has been persistently high with last A1c 8.7  Side effects from medications have been: Diarrhea from metformin  Current management, blood sugar patterns and problems identified:  He did  not bring his monitor for download, usually checks his blood sugars mostly in the mornings and not after meals as instructed  His insurance does not cover the freestyle [place] system  Again he has not changed the insulin doses as recommended on his previous visit  Even though he has been asked to consider the insulin pump for several times he is reluctant to do this  Previously was having significant hyperglycemia after evening meal especially when evaluated on the CGM; not clear how often he is checking after his evening meal but he thinks his POSTPRANDIAL readings after supper are not high  He may have relatively higher readings after lunch  Previously also is having HYPOGLYCEMIA overnight and he does not think this is occurring more than once a week, still not taking the reduced dose of Toujeo as suggested  He felt hypoglycemic yesterday about 2 hours after breakfast without any increased activity or change in his diet.   Glucose monitoring:  done 1-2 times a day         Glucometer:  Accu-Chek  Blood Glucose readings by recall  Mean values apply above for all meters except median for One Touch  PRE-MEAL Fasting Lunch Dinner Bedtime Overall  Glucose range: 120 130  130   Mean/median:        POST-MEAL PC Breakfast PC Lunch PC Dinner  Glucose range:  50 170   Mean/median:        Mean values apply above for all meters except median for One Touch  PRE-MEAL Fasting Lunch Dinner Bedtime Overall  Glucose range: 90-223     104-241   Mean/median: 142   194 125   POST-MEAL PC Breakfast PC lunch  Overnight  Glucose range:   45-59   Mean/median:  128     Self-care:     Meals: 3 meals per day. Breakfast is cereal Or oatmeal, occasionally eggs and toast or grits Lunch can be an sandwich/hamburger, rarely fried chicken or jelly beans The evening meal usually chicken, occasionally fast food or pasta, or small amount of beef or deer meat and vegetables.   Snacks: Popcorn, orange or  apple            Exercise:  He has been trying to be a little more recently, he thinks she is getting about 4000 steps daily Dietician visit, most recent: 1/16               Weight history: Previously 202-236  Wt Readings from Last 3 Encounters:  06/01/16 239 lb (108.4 kg)  04/01/16 231 lb (104.8 kg)  03/18/16 239 lb (108.4 kg)   Glycemic control:   Lab Results  Component Value Date   HGBA1C 8.7 (H) 03/27/2016   HGBA1C 8.8 01/01/2016   HGBA1C 10.2 (H) 10/11/2015   Lab Results  Component Value Date   MICROALBUR 1.8 03/27/2016   LDLCALC 103 (H) 01/14/2016   CREATININE 1.07 03/27/2016       Allergies as of 06/01/2016      Reactions   Prednisone Other (See Comments)   Run sugar over 600 causes hospitalization      Medication List       Accurate as of 06/01/16 10:11 AM. Always use your most recent med list.          alprazolam 2 MG tablet Commonly known as:  XANAX Take 1-2 mg by mouth 2 (two) times daily as needed for anxiety.   aspirin 81 MG tablet Take 81 mg by mouth every evening.   atorvastatin 40 MG tablet Commonly known as:  LIPITOR Take 40 mg by mouth every evening.   buPROPion 150 MG 12 hr tablet Commonly known as:  WELLBUTRIN SR Take 150 mg by mouth 2 (two) times daily.   diazepam 5 MG tablet Commonly known as:  VALIUM Take 1 tablet (5 mg total) by mouth every 6 (six) hours as needed for muscle spasms.   esomeprazole 40 MG capsule Commonly known as:  NEXIUM TAKE 1 CAPSULE (40 MG TOTAL) BY MOUTH DAILY.   HUMALOG KWIKPEN 200 UNIT/ML Sopn Generic drug:  Insulin Lispro INJECT 50 UNITS AT BREAKFAST, 30 UNITS AT LUNCH, AND 50 UNITS AT DINNER. A MAX DAILY DOSE OF 130 UNITS   lisinopril 5 MG tablet Commonly known as:  PRINIVIL,ZESTRIL Take 5 mg by mouth every evening.   metoprolol succinate 25 MG 24 hr tablet Commonly known as:  TOPROL-XL Take 25 mg by mouth at bedtime.   nitroGLYCERIN 0.4 MG SL tablet Commonly known as:  NITROSTAT Place 1  tablet (0.4 mg total) under the tongue every 5 (five) minutes as needed for chest pain.   oxyCODONE-acetaminophen 5-325 MG tablet Commonly known as:  ROXICET Take 1-2 tablets by mouth every 4 (four) hours as needed for severe pain.   pioglitazone 30 MG tablet Commonly known as:  ACTOS Take 1 tablet (30 mg total) by  mouth daily.   TOUJEO SOLOSTAR 300 UNIT/ML Sopn Generic drug:  Insulin Glargine INJECT 120 UNITS INTO THE SKIN DAILY BEFORE BREAKFAST.       Allergies:  Allergies  Allergen Reactions  . Prednisone Other (See Comments)    Run sugar over 600 causes hospitalization    Past Medical History:  Diagnosis Date  . Angina pectoris ([place])   . Arthritis   . Bone spur of left foot   . Chest pain   . COPD (chronic obstructive pulmonary disease) ([place])   . Coronary artery disease    no stents - some blockage (35% in "widowmaker) - 2014  . Diabetes mellitus without complication ([place])    type 2  . Dysrhythmia    goes fast at times  . GERD (gastroesophageal reflux disease)   . History of bronchitis   . History of hiatal hernia   . History of pneumonia   . Hyperlipidemia   . Hypertension   . Measles   . Mumps   . Near syncope   . Neuropathy ([place])   . Obesity   . Palpitations   . Pneumonia     Past Surgical History:  Procedure Laterality Date  . ANTERIOR CERVICAL DECOMP/DISCECTOMY FUSION N/A 04/26/2015   Procedure: Anterior Cervical Discectomy and Fusion - Cervical five-Cervical six with alta interbody;  Surgeon: Kristeen Miss, MD;  Location: [facility];  Service: Neurosurgery;  Laterality: N/A;  . ANTERIOR CERVICAL DECOMP/DISCECTOMY FUSION N/A 10/21/2015   Procedure: Cervical seven-Thoracic one ANTERIOR CERVICAL DECOMPRESSION/DISKECTOMY/FUSION WITH ALTA PLATE;  Surgeon: Kristeen Miss, MD;  Location: [facility];  Service: Neurosurgery;  Laterality: N/A;  C7-T1 ANTERIOR CERVICAL DECOMPRESSION/DISKECTOMY/FUSION WITH ALTA PLATE  . ANTERIOR CERVICAL DISCECTOMY    . CARDIAC  CATHETERIZATION    . CARPAL TUNNEL RELEASE Right 07/19/2015   Procedure: Right Carpal Tunnel Release;  Surgeon: Kristeen Miss, MD;  Location: [facility];  Service: Neurosurgery;  Laterality: Right;  Right Carpal tunnel release  . COLONOSCOPY    . EYE SURGERY Bilateral    cataracts  . FOOT SURGERY Left   . POSTERIOR CERVICAL FUSION/FORAMINOTOMY N/A 07/12/2015   Procedure: Cervical five-six Cervical seven-Thoracic one Posterior cervical fusion with instrumention;  Surgeon: Kristeen Miss, MD;  Location: [facility];  Service: Neurosurgery;  Laterality: N/A;  C5-6 C7-T1 Posterior cervical fusion with lateral mass fixation  . SPINE SURGERY  03/21/13   Lumbar fusion  . ULNAR NERVE TRANSPOSITION Right 07/19/2015   Procedure: Right Ulnar nerve release ;  Surgeon: Kristeen Miss, MD;  Location: [facility];  Service: Neurosurgery;  Laterality: Right;  Right Ulnar nerve release     Family History  Problem Relation Age of Onset  . Diabetes Mother   . Heart attack Mother   . Diabetes Father   . Heart disease Father   . Heart attack Father   . Diabetes Brother   . Hyperlipidemia Other   . Hypertension Other   . Colon cancer Neg Hx     Social History:  reports that he quit smoking about 14 months ago. His smoking use included Cigarettes. He started smoking about 2 years ago. He has a 6.25 pack-year smoking history. He has never used smokeless tobacco. He reports that he drinks alcohol. He reports that he does not use drugs.    Review of Systems     Mild hypertension, treated by PCP with low-dose lisinopril and metoprolol        Lipids: As below.  Has been on Lipitor  for Control  Last LDL test over 100, followed by PCP       Lab Results  Component Value Date   CHOL 148 01/14/2016   HDL 23 (L) 01/14/2016   LDLCALC 103 (H) 01/14/2016   TRIG 111 01/14/2016   CHOLHDL 6.4 (H) 01/14/2016                    He has Numbness, tingling and burning .  Taking gabapentin high doses for  this.  Foot exam in 2/17 showed the following: significantly decreased monofilament sensation in the toes especially left fifth toe, no skin lesions or ulcers on the feet and normal pulses    Physical Examination:  BP 118/66   Pulse 60   Ht 5\' 10"  (1.778 m)   Wt 239 lb (108.4 kg)   BMI 34.29 kg/m      Right anterior lower leg shows a abrasion without infection or surrounding skin changes No ankle edema   ASSESSMENT:  Diabetes type 2, uncontrolled  See history of present illness for detailed description of his daily regimen, problems identified and blood sugar patterns  He is on large doses of Basal bolus insulin along with other agents: Taking Actos, Invokana and Victoza  He has not had his blood sugar monitor to review today and difficult to know if he has a consistent pattern Usually has an A1c over 8% consistently and not able to control with basal bolus insulin He is somewhat reluctant to consider changes in his regimen including insulin doses and also continues to resist insulin pump   PLAN:    He will look into the cost of the DexCom CGM if available or the new Medtronic CGM  Again strongly emphasized the need for better blood sugar control and more information of his blood sugar patterns  He will also consider insulin pump, discussed that usually people with insulin resistant will do better with an infusion device  He does need to adjust his mealtime doses based on what he is eating rather than pre-meal blood sugar which he is doing now  Most likely needs at least 5 units less at breakfast and 5 units more at lunchtime of the Humalog  Encouraged him to work with the wellness program for meal planning, he does need to have balanced meals with protein at breakfast and also avoiding fruits for snacks, may continue to use low-fat popcorn at night  Check fructosamine and if it is significantly high will have him look at the insulin pump  Emphasized the need for  checking readings after meals   Patient Instructions  Check on Dexcom system and Omnipod pump  Take 55 at Bfst and and 45 at lunch     Counseling time on subjects discussed above is over 50% of today's 25 minute visit   Devian Bartolomei 06/01/2016, 10:11 AM   Note: This office note was prepared with Estate agent. Any transcriptional errors that result from this process are unintentional.

## 2016-06-02 LAB — FRUCTOSAMINE: Fructosamine: 316 umol/L — ABNORMAL HIGH (ref 0–285)

## 2016-06-09 ENCOUNTER — Encounter: Payer: Self-pay | Admitting: Endocrinology

## 2016-06-09 DIAGNOSIS — S80811A Abrasion, right lower leg, initial encounter: Secondary | ICD-10-CM | POA: Diagnosis not present

## 2016-06-09 DIAGNOSIS — Z79899 Other long term (current) drug therapy: Secondary | ICD-10-CM | POA: Diagnosis not present

## 2016-06-09 DIAGNOSIS — E7800 Pure hypercholesterolemia, unspecified: Secondary | ICD-10-CM | POA: Diagnosis not present

## 2016-06-09 DIAGNOSIS — E1140 Type 2 diabetes mellitus with diabetic neuropathy, unspecified: Secondary | ICD-10-CM | POA: Diagnosis not present

## 2016-06-15 MED FILL — BUPROPION HCL XL 300 MG TAB: 300 | 90 days supply | Qty: 90 | Fill #0

## 2016-06-15 MED FILL — LISINOPRIL 5 MG TABLET: 5 | 90 days supply | Qty: 90 | Fill #2

## 2016-06-15 MED FILL — ATORVASTATIN 40 MG TABLET: 40 | 90 days supply | Qty: 90 | Fill #2

## 2016-06-16 ENCOUNTER — Telehealth: Payer: Self-pay | Admitting: Nutrition

## 2016-06-16 NOTE — Telephone Encounter (Signed)
I have scheduled an appt. For next week to show him the different pumps

## 2016-06-16 NOTE — Telephone Encounter (Signed)
Pt.wants a insulin pump, but does not know what kind.  Appt. Made for next Wednesday to show him the different pumps.

## 2016-06-23 MED FILL — ALPRAZolam 2 MG TABS: 2 | 30 days supply | Qty: 90 | Fill #2

## 2016-06-24 ENCOUNTER — Encounter: Payer: 59 | Attending: Endocrinology | Admitting: Nutrition

## 2016-06-24 DIAGNOSIS — Z713 Dietary counseling and surveillance: Secondary | ICD-10-CM | POA: Diagnosis not present

## 2016-06-24 DIAGNOSIS — E669 Obesity, unspecified: Secondary | ICD-10-CM

## 2016-06-24 DIAGNOSIS — E1165 Type 2 diabetes mellitus with hyperglycemia: Secondary | ICD-10-CM | POA: Diagnosis not present

## 2016-06-24 DIAGNOSIS — E1169 Type 2 diabetes mellitus with other specified complication: Secondary | ICD-10-CM

## 2016-06-24 NOTE — Patient Instructions (Signed)
Read over brochures given, and call if questions.

## 2016-06-24 NOTE — Progress Notes (Signed)
Arthur Mata was shown the different pumps, and we discussed the advantages of insulin pump therapy.  He was given brochures on each pump and we discussed each pump's advantages.  He does not want to use a pump, and says it is a lot of trouble and cost.  He is using the app for the new accu-chek meter and testing 4X/day with walking 4000 steps.  Says blood sugars and down, with less lows, and app shows his HgbA1C at 6.7% right now. He was shown the dexcom and Libre sensor, but says that he can recognize when his blood sugars are goiing up, or down, and has glucose tablets everywhere--truck, car, motorcycle, and pocket.   He was given a brochure on the Dexcom and said he would think about it.

## 2016-06-30 ENCOUNTER — Telehealth: Payer: Self-pay | Admitting: Endocrinology

## 2016-06-30 MED ORDER — PIOGLITAZONE HCL 30 MG PO TABS: 30.0000 mg | ORAL_TABLET | Freq: Every day | ORAL | 3 refills | Status: DC

## 2016-06-30 MED FILL — ESOMEPRAZOLE MAGNESIUM 40 M: 40 | 30 days supply | Qty: 30 | Fill #2

## 2016-06-30 MED FILL — PIOGLITAZONE HCL 30 MG TAB: 30 | 30 days supply | Qty: 30 | Fill #0

## 2016-06-30 NOTE — Telephone Encounter (Signed)
Refill submitted. 

## 2016-06-30 NOTE — Telephone Encounter (Signed)
Babatunde needs a refill sent into [facility] for his  pioglitazone (ACTOS) 30 MG tablet

## 2016-07-13 MED FILL — ACCU-CHEK GUIDE TEST STRIP: 30 days supply | Qty: 100 | Fill #1

## 2016-07-13 MED FILL — METOPROLOL SUCC ER 25 MG TA: 25 | 90 days supply | Qty: 90 | Fill #2

## 2016-07-28 MED FILL — PIOGLITAZONE HCL 30 MG TAB: 30 | 30 days supply | Qty: 30 | Fill #1

## 2016-07-28 MED FILL — ESOMEPRAZOLE MAGNESIUM 40 M: 40 | 30 days supply | Qty: 30 | Fill #3

## 2016-08-17 MED FILL — ROSUVASTATIN CALCIUM 10 MG: 10 | 90 days supply | Qty: 90 | Fill #1

## 2016-08-24 MED FILL — ESOMEPRAZOLE MAGNESIUM 40 M: 40 | 30 days supply | Qty: 30 | Fill #4

## 2016-09-04 MED FILL — PIOGLITAZONE HCL 30 MG TAB: 30 | 30 days supply | Qty: 30 | Fill #2

## 2016-09-07 ENCOUNTER — Other Ambulatory Visit (INDEPENDENT_AMBULATORY_CARE_PROVIDER_SITE_OTHER): Payer: 59

## 2016-09-07 ENCOUNTER — Other Ambulatory Visit: Payer: Self-pay | Admitting: Endocrinology

## 2016-09-07 DIAGNOSIS — E1165 Type 2 diabetes mellitus with hyperglycemia: Secondary | ICD-10-CM

## 2016-09-07 DIAGNOSIS — Z794 Long term (current) use of insulin: Secondary | ICD-10-CM | POA: Diagnosis not present

## 2016-09-07 LAB — BASIC METABOLIC PANEL
BUN: 19 mg/dL (ref 6–23)
CALCIUM: 9.5 mg/dL (ref 8.4–10.5)
CO2: 26 mEq/L (ref 19–32)
Chloride: 99 mEq/L (ref 96–112)
Creatinine, Ser: 1 mg/dL (ref 0.40–1.50)
GFR: 81.14 mL/min (ref >60.00)
Glucose, Bld: 300 mg/dL — ABNORMAL HIGH (ref 70–99)
Potassium: 4.6 mEq/L (ref 3.5–5.1)
SODIUM: 133 meq/L — AB (ref 135–145)

## 2016-09-07 LAB — HEMOGLOBIN A1C: HEMOGLOBIN A1C: 10.3 % — AB (ref 4.6–6.5)

## 2016-09-07 LAB — LIPID PANEL
CHOLESTEROL: 127 mg/dL (ref 0–200)
HDL: 26.1 mg/dL — AB (ref >39.00)
LDL Cholesterol: 70 mg/dL (ref 0–99)
NonHDL: 101.14
Total CHOL/HDL Ratio: 5
Triglycerides: 156 mg/dL — ABNORMAL HIGH (ref 0.0–149.0)
VLDL: 31.2 mg/dL (ref 0.0–40.0)

## 2016-09-08 DIAGNOSIS — F419 Anxiety disorder, unspecified: Secondary | ICD-10-CM | POA: Diagnosis not present

## 2016-09-08 DIAGNOSIS — I10 Essential (primary) hypertension: Secondary | ICD-10-CM | POA: Diagnosis not present

## 2016-09-08 DIAGNOSIS — E7800 Pure hypercholesterolemia, unspecified: Secondary | ICD-10-CM | POA: Diagnosis not present

## 2016-09-08 DIAGNOSIS — I471 Supraventricular tachycardia: Secondary | ICD-10-CM | POA: Diagnosis not present

## 2016-09-08 DIAGNOSIS — E1140 Type 2 diabetes mellitus with diabetic neuropathy, unspecified: Secondary | ICD-10-CM | POA: Diagnosis not present

## 2016-09-08 DIAGNOSIS — I2510 Atherosclerotic heart disease of native coronary artery without angina pectoris: Secondary | ICD-10-CM | POA: Diagnosis not present

## 2016-09-10 ENCOUNTER — Ambulatory Visit (INDEPENDENT_AMBULATORY_CARE_PROVIDER_SITE_OTHER): Payer: 59 | Admitting: Endocrinology

## 2016-09-10 ENCOUNTER — Encounter: Payer: Self-pay | Admitting: Endocrinology

## 2016-09-10 ENCOUNTER — Other Ambulatory Visit: Payer: Self-pay | Admitting: Endocrinology

## 2016-09-10 VITALS — BP 120/76 | HR 58 | Ht 70.0 in | Wt 226.2 lb

## 2016-09-10 DIAGNOSIS — Z794 Long term (current) use of insulin: Secondary | ICD-10-CM | POA: Diagnosis not present

## 2016-09-10 DIAGNOSIS — E1165 Type 2 diabetes mellitus with hyperglycemia: Secondary | ICD-10-CM | POA: Diagnosis not present

## 2016-09-10 MED ORDER — CANAGLIFLOZIN 300 MG PO TABS: 300.0000 mg | ORAL_TABLET | Freq: Every day | ORAL | 3 refills | Status: DC

## 2016-09-10 MED FILL — INVOKANA 300 MG TABLET: 300 | 30 days supply | Qty: 30 | Fill #0

## 2016-09-10 MED FILL — TOUJEO SOLOSTAR 300 UNITS/M: 300 | 34 days supply | Qty: 14 | Fill #0

## 2016-09-10 NOTE — Progress Notes (Signed)
Patient ID: Arthur Mata, male   DOB: 1955-10-11, 61 y.o.   MRN: [id number]                  Reason for Appointment:  Follow-up for Type 2 Diabetes  Referring physician: Girtha Rm  History of Present Illness:          Diagnosis: Type 2 diabetes mellitus, date of diagnosis: 12/2011      Past history:  At the time of diagnosis he had symptoms of frequent urination, thirst and blurred vision He thinks his blood sugar was about 600.  His physician started him on Novolog and also Metformin  Details of initial treatment are not available but he also apparently took Victoza at some point before insulin and he does not think it worked After some time he was also given Levemir in addition to his NovoLog His blood sugars were somewhat better controlled after some time and were averaging about 200. He thinks that because of diarrhea his metformin was stopped after a few months.  Apparently in 10/2012 he had epidural steroid and with this his blood sugar went up to 600.  However he thinks his blood sugars had been persistently poorly controlled since then. Actos was started in 06/2012 without much benefit. He was also tried on Invokana for about 3 months and reportedly had no benefit from this. He was started on U-500 instead of Levemir and NovoLog in late 2014 His blood sugars had been poorly controlled with A1c ranging from 10.5-12.5 and prior to his initial consultation. On his initial consultation he was switched from the U-500 insulin to Toujeo 120 units hs and Humalog U-200  Recent history   INSULIN regimen:  Toujeo 80 units a.m. and 75 units p.m. Humalog U-200 before meals = 60-30-60   Non-insulin hypoglycemic drugs the patient is taking: Not on Invokana 300 mg daily, Victoza 1.8 mg daily, only on Actos 30 mg     A1c has been persistently high but not clear why his A1c has gone up to 10.3, previously 8.7  Side effects from medications have been: Diarrhea from metformin  Current management,  blood sugar patterns and problems identified:  His insurance does not cover the freestyle [place] system as of the last visit  He was told to start looking into the insulin pump and he also had a discussion with the nurse educator about this but apparently his wife has not verified the coverage  Also he had been taking Victoza and Invokana previously and somehow he has not had this prescription renewed probably for several months and he did not pay attention to this  Again he has not changed the insulin doses despite blood sugars been consistently high  Now FASTING readings are consistently over 200 which is higher than usual  He is not checking her sugars much and only sporadically after breakfast or supper  His blood sugars are over 200 after supper from recent testing  He says he does not eat any snacks like peanuts later at night  He has not done any readings after lunch and taking only small amount of insulin to cover his lunch meal  Previously on CGM was having HYPOGLYCEMIA overnight   He felt hypoglycemic  about 2 hours after breakfast a few days ago and he tends to have low normal sugars before lunch frequently  He is not excessively thirsty but has dropped 13 pounds   Glucose monitoring:  done 1 times a day or less  Glucometer:  Accu-Chek  Blood Glucose readings by   Mean values apply above for all meters except median for One Touch  PRE-MEAL Fasting Lunch Dinner Bedtime Overall  Glucose range:  220-320  263      Mean/median: 250     239    POST-MEAL PC Breakfast PC Lunch PC Dinner  Glucose range: 42, 98   225, 267   Mean/median:       Self-care:     Meals: 3 meals per day. Breakfast is cereal Or oatmeal, occasionally eggs and toast or grits Lunch can be an sandwich/hamburger, rarely fried chicken or jelly beans The evening meal usually chicken, occasionally fast food or pasta, or small amount of beef or deer meat and vegetables.   Snacks: Popcorn, orange or  apple            Exercise:  some activity, not any programmed exercise Dietician visit, most recent: 1/16               Weight history: Previously 202-236  Wt Readings from Last 3 Encounters:  09/10/16 226 lb 3.2 oz (102.6 kg)  06/01/16 239 lb (108.4 kg)  04/01/16 231 lb (104.8 kg)   Glycemic control:   Lab Results  Component Value Date   HGBA1C 10.3 (H) 09/07/2016   HGBA1C 8.7 (H) 03/27/2016   HGBA1C 8.8 01/01/2016   Lab Results  Component Value Date   MICROALBUR 1.8 03/27/2016   LDLCALC 70 09/07/2016   CREATININE 1.00 09/07/2016       Allergies as of 09/10/2016      Reactions   Prednisone Other (See Comments)   Run sugar over 600 causes hospitalization      Medication List       Accurate as of 09/10/16  9:27 AM. Always use your most recent med list.          alprazolam 2 MG tablet Commonly known as:  XANAX Take 1-2 mg by mouth 2 (two) times daily as needed for anxiety.   aspirin 81 MG tablet Take 81 mg by mouth every evening.   atorvastatin 40 MG tablet Commonly known as:  LIPITOR Take 40 mg by mouth every evening.   buPROPion 300 MG 24 hr tablet Commonly known as:  WELLBUTRIN XL   esomeprazole 40 MG capsule Commonly known as:  NEXIUM TAKE 1 CAPSULE (40 MG TOTAL) BY MOUTH DAILY.   HUMALOG KWIKPEN 200 UNIT/ML Sopn Generic drug:  Insulin Lispro INJECT 50 UNITS AT BREAKFAST, 30 UNITS AT LUNCH, AND 50 UNITS AT DINNER. A MAX DAILY DOSE OF 130 UNITS   lisinopril 5 MG tablet Commonly known as:  PRINIVIL,ZESTRIL Take 5 mg by mouth every evening.   metoprolol succinate 25 MG 24 hr tablet Commonly known as:  TOPROL-XL Take 25 mg by mouth at bedtime.   nitroGLYCERIN 0.4 MG SL tablet Commonly known as:  NITROSTAT Place 1 tablet (0.4 mg total) under the tongue every 5 (five) minutes as needed for chest pain.   pioglitazone 30 MG tablet Commonly known as:  ACTOS Take 1 tablet (30 mg total) by mouth daily.   rosuvastatin 10 MG tablet Commonly known  as:  CRESTOR   TOUJEO SOLOSTAR 300 UNIT/ML Sopn Generic drug:  Insulin Glargine INJECT 120 UNITS INTO THE SKIN DAILY BEFORE BREAKFAST.       Allergies:  Allergies  Allergen Reactions  . Prednisone Other (See Comments)    Run sugar over 600 causes hospitalization    Past Medical History:  Diagnosis  Date  . Angina pectoris ([place])   . Arthritis   . Bone spur of left foot   . Chest pain   . COPD (chronic obstructive pulmonary disease) ([place])   . Coronary artery disease    no stents - some blockage (35% in "widowmaker) - 2014  . Diabetes mellitus without complication ([place])    type 2  . Dysrhythmia    goes fast at times  . GERD (gastroesophageal reflux disease)   . History of bronchitis   . History of hiatal hernia   . History of pneumonia   . Hyperlipidemia   . Hypertension   . Measles   . Mumps   . Near syncope   . Neuropathy   . Obesity   . Palpitations   . Pneumonia     Past Surgical History:  Procedure Laterality Date  . ANTERIOR CERVICAL DECOMP/DISCECTOMY FUSION N/A 04/26/2015   Procedure: Anterior Cervical Discectomy and Fusion - Cervical five-Cervical six with alta interbody;  Surgeon: Kristeen Miss, MD;  Location: [facility];  Service: Neurosurgery;  Laterality: N/A;  . ANTERIOR CERVICAL DECOMP/DISCECTOMY FUSION N/A 10/21/2015   Procedure: Cervical seven-Thoracic one ANTERIOR CERVICAL DECOMPRESSION/DISKECTOMY/FUSION WITH ALTA PLATE;  Surgeon: Kristeen Miss, MD;  Location: [facility];  Service: Neurosurgery;  Laterality: N/A;  C7-T1 ANTERIOR CERVICAL DECOMPRESSION/DISKECTOMY/FUSION WITH ALTA PLATE  . ANTERIOR CERVICAL DISCECTOMY    . CARDIAC CATHETERIZATION    . CARPAL TUNNEL RELEASE Right 07/19/2015   Procedure: Right Carpal Tunnel Release;  Surgeon: Kristeen Miss, MD;  Location: [facility];  Service: Neurosurgery;  Laterality: Right;  Right Carpal tunnel release  . COLONOSCOPY    . EYE SURGERY Bilateral    cataracts  . FOOT SURGERY Left   . POSTERIOR  CERVICAL FUSION/FORAMINOTOMY N/A 07/12/2015   Procedure: Cervical five-six Cervical seven-Thoracic one Posterior cervical fusion with instrumention;  Surgeon: Kristeen Miss, MD;  Location: [facility];  Service: Neurosurgery;  Laterality: N/A;  C5-6 C7-T1 Posterior cervical fusion with lateral mass fixation  . SPINE SURGERY  03/21/13   Lumbar fusion  . ULNAR NERVE TRANSPOSITION Right 07/19/2015   Procedure: Right Ulnar nerve release ;  Surgeon: Kristeen Miss, MD;  Location: [facility];  Service: Neurosurgery;  Laterality: Right;  Right Ulnar nerve release     Family History  Problem Relation Age of Onset  . Diabetes Mother   . Heart attack Mother   . Diabetes Father   . Heart disease Father   . Heart attack Father   . Diabetes Brother   . Hyperlipidemia Other   . Hypertension Other   . Colon cancer Neg Hx     Social History:  reports that he quit smoking about 17 months ago. His smoking use included Cigarettes. He started smoking about 2 years ago. He has a 6.25 pack-year smoking history. He has never used smokeless tobacco. He reports that he drinks alcohol. He reports that he does not use drugs.    Review of Systems     Mild hypertension, treated by PCP with low-dose lisinopril and metoprolol        Lipids: As below.  Has been on Crestor now from PCP Last LDL test under 100, followed by PCP       Lab Results  Component Value Date   CHOL 127 09/07/2016   HDL 26.10 (L) 09/07/2016   LDLCALC 70 09/07/2016   TRIG 156.0 (H) 09/07/2016   CHOLHDL 5 09/07/2016  He has Numbness, tingling and burning .  Previously on gabapentin  Foot exam in 2/17 showed the following: significantly decreased monofilament sensation in the toes especially left fifth toe, no skin lesions or ulcers on the feet and normal pulses    Physical Examination:  BP 120/76   Pulse (!) 58   Ht 5\' 10"  (1.778 m)   Wt 226 lb 3.2 oz (102.6 kg)   SpO2 98%   BMI 32.46 kg/m         ASSESSMENT:  Diabetes type 2, uncontrolled  See history of present illness for detailed description of his daily regimen, problems identified and blood sugar patterns  He is on large doses of Basal bolus insulin along with Actos His A1c is up almost 3% and this may from his leaving off Invokana and Victoza which she was taking before and did not renew the prescription Checking blood sugars infrequently and his blood sugars are over 200 fasting but not clear what his blood sugar patterns are later in the day He has tendency to low normal or low sugars between breakfast and lunch Also is taking very small amounts of insulin at lunchtime relatively and not clear if blood sugars are going up in the afternoon  He has not pursued CGM or insulin pump as directed  PLAN:    He will look into the cost of the DexCom CGM or the new Medtronic CGM  However he needs to call the insurance to verify coverage for the Medtronic pump as this may be the ideal solution for him  He is also a candidate for the V-go pump with the U-500 insulin but he says he has profuse sweating during summer months and will not be able to keep anything stuck on his skin, showed him how this would be used  Also if he does use the Medtronic pump he will probably need the U-500 insulin  Restart INVOKANA before breakfast  Stop lisinopril with starting Invokana  Reduce insulin 15 units for mealtime coverage at breakfast  Increase basal insulin by 15 units twice a day  Consider increasing dose at lunchtime  He does need to check sugars much more frequently until he gets the CGM  Follow-up in 6 weeks   Patient Instructions  Toujeo 95 am and 90 in pm    Counseling time on subjects discussed in assessment and plan sections is over 50% of today's 25 minute visit    Mavis Gravelle 09/10/2016, 9:27 AM   Note: This office note was prepared with Estate agent. Any transcriptional errors  that result from this process are unintentional.

## 2016-09-10 NOTE — Patient Instructions (Addendum)
Toujeo 95 am and 90 in pm  Reduce am Humalog to 45   Check blood sugars on waking up  5/7 days  Also check blood sugars about 2 hours after a meal and do this after different meals by rotation  Recommended blood sugar levels on waking up is 90-130 and about 2 hours after meal is 130-160  Please bring your blood sugar monitor to each visit, thank you  Check on Freestyle or Guardian or Dexcom sensor

## 2016-09-14 MED FILL — BUPROPION HCL XL 300 MG TAB: 300 | 90 days supply | Qty: 90 | Fill #1

## 2016-09-14 MED FILL — LISINOPRIL 5 MG TAB: 5 | 90 days supply | Qty: 90 | Fill #3

## 2016-09-14 MED FILL — ATORVASTATIN 40 MG TABLET: 40 | 90 days supply | Qty: 90 | Fill #3

## 2016-09-19 IMAGING — RF DG C-ARM 61-120 MIN
1 series · 1 of 1 positions shown · non-contrast
Comparison: 06/27/2015 and 05/09/2015

CLINICAL DATA: Cervical fusion and lateral mass fixation C5-C6 and
C7-T1

EXAM:
DG C-ARM 61-120 MIN; CERVICAL SPINE - 2-3 VIEW

[Series 1: run · 1 of 1 slices shown]
[im 1/1]
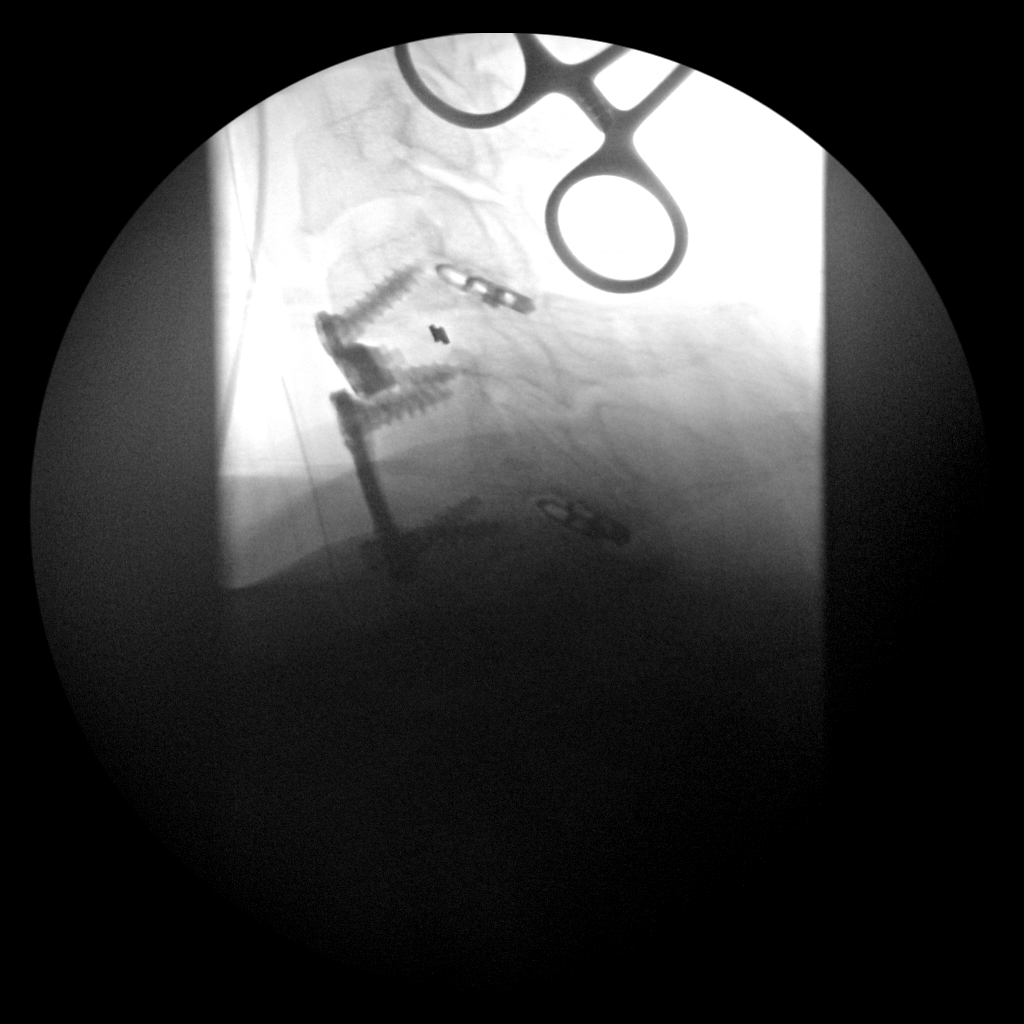

[1 of 1 positions shown; findings below may reference images not displayed]

FINDINGS: Three views of lumbar spine submitted. Again noted anterior fusion
with metallic plate and screws at C5-C6 and C6-C7 level. Posterior
lateral fixation material noted at C5-C6 and C7-T1 level. There is
anatomic alignment.

Fluoroscopy time was 2 minutes 38 seconds. Please see the operative
report.
IMPRESSION: Again noted anterior fusion with metallic plate and screws at C5-C6
and C6-C7 level. Posterior lateral fixation material noted at C5-C6
and C7-T1 level. There is anatomic alignment.

Fluoroscopy time was 2 minutes 38 seconds. Please see the operative
report.

## 2016-09-21 MED FILL — ESOMEPRAZOLE MAGNESIUM 40 M: 40 | 30 days supply | Qty: 30 | Fill #5

## 2016-09-24 MED FILL — ACCU-CHEK GUIDE TEST STRIP: 30 days supply | Qty: 100 | Fill #2

## 2016-10-01 MED FILL — PIOGLITAZONE HCL 30 MG TAB: 30 | 30 days supply | Qty: 30 | Fill #3

## 2016-10-01 MED FILL — ALPRAZolam 2 MG TABS: 2 | 30 days supply | Qty: 90 | Fill #0

## 2016-10-05 ENCOUNTER — Telehealth: Payer: Self-pay | Admitting: Endocrinology

## 2016-10-05 MED FILL — INVOKANA 300 MG TABLET: 300 | 30 days supply | Qty: 30 | Fill #1

## 2016-10-05 NOTE — Telephone Encounter (Signed)
Made in error

## 2016-10-12 MED FILL — METOPROLOL SUCC ER 25 MG TA: 25 | 90 days supply | Qty: 90 | Fill #3

## 2016-10-19 ENCOUNTER — Other Ambulatory Visit: Payer: Self-pay | Admitting: Endocrinology

## 2016-10-19 ENCOUNTER — Other Ambulatory Visit (INDEPENDENT_AMBULATORY_CARE_PROVIDER_SITE_OTHER): Payer: 59

## 2016-10-19 DIAGNOSIS — E1165 Type 2 diabetes mellitus with hyperglycemia: Secondary | ICD-10-CM

## 2016-10-19 DIAGNOSIS — Z794 Long term (current) use of insulin: Secondary | ICD-10-CM | POA: Diagnosis not present

## 2016-10-19 LAB — BASIC METABOLIC PANEL
BUN: 19 mg/dL (ref 6–23)
CHLORIDE: 103 meq/L (ref 96–112)
CO2: 26 meq/L (ref 19–32)
Calcium: 9.9 mg/dL (ref 8.4–10.5)
Creatinine, Ser: 1.06 mg/dL (ref 0.40–1.50)
GFR: 75.84 mL/min (ref >60.00)
GLUCOSE: 168 mg/dL — AB (ref 70–99)
POTASSIUM: 4.5 meq/L (ref 3.5–5.1)
SODIUM: 135 meq/L (ref 135–145)

## 2016-10-20 ENCOUNTER — Other Ambulatory Visit: Payer: Self-pay | Admitting: Internal Medicine

## 2016-10-20 MED FILL — ESOMEPRAZOLE MAGNESIUM 40 M: 40 | 90 days supply | Qty: 90 | Fill #0

## 2016-10-21 ENCOUNTER — Ambulatory Visit (INDEPENDENT_AMBULATORY_CARE_PROVIDER_SITE_OTHER): Payer: 59 | Admitting: Endocrinology

## 2016-10-21 ENCOUNTER — Encounter: Payer: Self-pay | Admitting: Endocrinology

## 2016-10-21 VITALS — BP 120/66 | HR 59 | Ht 70.0 in | Wt 223.2 lb

## 2016-10-21 DIAGNOSIS — E1165 Type 2 diabetes mellitus with hyperglycemia: Secondary | ICD-10-CM | POA: Diagnosis not present

## 2016-10-21 DIAGNOSIS — Z794 Long term (current) use of insulin: Secondary | ICD-10-CM

## 2016-10-21 NOTE — Patient Instructions (Addendum)
Take 65-70 Humalog units at dinner  More sugars at bedtime

## 2016-10-21 NOTE — Progress Notes (Signed)
Patient ID: Arthur Mata, male   DOB: March 04, 1955, 61 y.o.   MRN: [id number]                  Reason for Appointment:  Follow-up for Type 2 Diabetes  Referring physician: Girtha Rm  History of Present Illness:          Diagnosis: Type 2 diabetes mellitus, date of diagnosis: 12/2011      Past history:  At the time of diagnosis he had symptoms of frequent urination, thirst and blurred vision He thinks his blood sugar was about 600.  His physician started him on Novolog and also Metformin  Details of initial treatment are not available but he also apparently took Victoza at some point before insulin and he does not think it worked After some time he was also given Levemir in addition to his NovoLog His blood sugars were somewhat better controlled after some time and were averaging about 200. He thinks that because of diarrhea his metformin was stopped after a few months.  Apparently in 10/2012 he had epidural steroid and with this his blood sugar went up to 600.  However he thinks his blood sugars had been persistently poorly controlled since then. Actos was started in 06/2012 without much benefit. He was also tried on Invokana for about 3 months and reportedly had no benefit from this. He was started on U-500 instead of Levemir and NovoLog in late 2014 His blood sugars had been poorly controlled with A1c ranging from 10.5-12.5 and prior to his initial consultation. On his initial consultation he was switched from the U-500 insulin to Toujeo 120 units hs and Humalog U-200  Recent history   INSULIN regimen:  Toujeo 95 units a.m. and 95 units p.m. Humalog U-200 before meals = 50-30-55/60  Non-insulin hypoglycemic drugs the patient is taking: Not on Invokana 300 mg daily, Victoza 1.8 mg daily, only on Actos 30 mg     A1c has been persistently high but not clear why his A1c has gone up to 10.3, previously 8.7  Side effects from medications have been: Diarrhea from metformin  Current  management, blood sugar patterns and problems identified:  His insurance does not cover the freestyle [place] system again  He is still waiting for the approval for the insulin pump  Although he has been constantly reminded to check his blood sugars regularly after meal is his not doing any recently except sporadically around 4-5 PM  Even with increasing the [place] on his last visit his fasting readings are averaging 170 or so  He has only a 3 or 4 readings after supper at various times and these are all consistently high, does not know how to explain his reading of 488  With starting Invokana his blood sugars overall are averaging lower, previously averaging 239  Also his weight is coming down  He is still eating fairly low-fat balanced meals at suppertime  Trying to be a little more active outside these days as of the last visit  He says his glucose monitor is sometimes malfunctioning and having difficulty getting his readings   Glucose monitoring:  done 1 times a day or less         Glucometer:  Accu-Chek  Blood Glucose readings by   Mean values apply above for all meters except median for One Touch  PRE-MEAL Fasting Lunch Dinner Bedtime Overall  Glucose range:  1 57-239   1 39-258  229-488    Mean/median: 174  207   193     Self-care:     Meals: 3 meals per day. Breakfast is cereal Or oatmeal, occasionally eggs and toast or grits Lunch can be an sandwich/hamburger, rarely fried chicken or jelly beans The evening meal usually chicken, occasionally fast food or pasta, or small amount of beef or deer meat and vegetables.   Snacks: Popcorn, orange or apple            Exercise:  some activity, not any programmed exercise  Dietician visit, most recent: 1/16               Weight history: Previously 202-236  Wt Readings from Last 3 Encounters:  10/21/16 223 lb 3.2 oz (101.2 kg)  09/10/16 226 lb 3.2 oz (102.6 kg)  06/01/16 239 lb (108.4 kg)   Glycemic control:   Lab  Results  Component Value Date   HGBA1C 10.3 (H) 09/07/2016   HGBA1C 8.7 (H) 03/27/2016   HGBA1C 8.8 01/01/2016   Lab Results  Component Value Date   MICROALBUR 1.8 03/27/2016   LDLCALC 70 09/07/2016   CREATININE 1.06 10/19/2016       Allergies as of 10/21/2016      Reactions   Prednisone Other (See Comments)   Run sugar over 600 causes hospitalization      Medication List       Accurate as of 10/21/16  8:28 AM. Always use your most recent med list.          alprazolam 2 MG tablet Commonly known as:  XANAX Take 1-2 mg by mouth 2 (two) times daily as needed for anxiety.   aspirin 81 MG tablet Take 81 mg by mouth every evening.   atorvastatin 40 MG tablet Commonly known as:  LIPITOR Take 40 mg by mouth every evening.   buPROPion 300 MG 24 hr tablet Commonly known as:  WELLBUTRIN XL   canagliflozin 300 MG Tabs tablet Commonly known as:  INVOKANA Take 1 tablet (300 mg total) by mouth daily before breakfast.   esomeprazole 40 MG capsule Commonly known as:  NEXIUM Take 1 capsule (40 mg total) by mouth daily.   HUMALOG KWIKPEN 200 UNIT/ML Sopn Generic drug:  Insulin Lispro INJECT 50 UNITS AT BREAKFAST, 30 UNITS AT LUNCH, AND 50 UNITS AT DINNER. A MAX DAILY DOSE OF 130 UNITS   lisinopril 5 MG tablet Commonly known as:  PRINIVIL,ZESTRIL Take 5 mg by mouth every evening.   metoprolol succinate 25 MG 24 hr tablet Commonly known as:  TOPROL-XL Take 25 mg by mouth at bedtime.   nitroGLYCERIN 0.4 MG SL tablet Commonly known as:  NITROSTAT Place 1 tablet (0.4 mg total) under the tongue every 5 (five) minutes as needed for chest pain.   pioglitazone 30 MG tablet Commonly known as:  ACTOS Take 1 tablet (30 mg total) by mouth daily.   rosuvastatin 10 MG tablet Commonly known as:  CRESTOR   TOUJEO SOLOSTAR 300 UNIT/ML Sopn Generic drug:  Insulin Glargine INJECT 120 UNITS INTO THE SKIN DAILY BEFORE BREAKFAST.       Allergies:  Allergies  Allergen Reactions   . Prednisone Other (See Comments)    Run sugar over 600 causes hospitalization    Past Medical History:  Diagnosis Date  . Angina pectoris ([place])   . Arthritis   . Bone spur of left foot   . Chest pain   . COPD (chronic obstructive pulmonary disease) ([place])   . Coronary artery disease    no  stents - some blockage (35% in "widowmaker) - 2014  . Diabetes mellitus without complication ([place])    type 2  . Dysrhythmia    goes fast at times  . GERD (gastroesophageal reflux disease)   . History of bronchitis   . History of hiatal hernia   . History of pneumonia   . Hyperlipidemia   . Hypertension   . Measles   . Mumps   . Near syncope   . Neuropathy   . Obesity   . Palpitations   . Pneumonia     Past Surgical History:  Procedure Laterality Date  . ANTERIOR CERVICAL DECOMP/DISCECTOMY FUSION N/A 04/26/2015   Procedure: Anterior Cervical Discectomy and Fusion - Cervical five-Cervical six with alta interbody;  Surgeon: Kristeen Miss, MD;  Location: [place] [facility];  Service: Neurosurgery;  Laterality: N/A;  . ANTERIOR CERVICAL DECOMP/DISCECTOMY FUSION N/A 10/21/2015   Procedure: Cervical seven-Thoracic one ANTERIOR CERVICAL DECOMPRESSION/DISKECTOMY/FUSION WITH ALTA PLATE;  Surgeon: Kristeen Miss, MD;  Location: [place];  Service: Neurosurgery;  Laterality: N/A;  C7-T1 ANTERIOR CERVICAL DECOMPRESSION/DISKECTOMY/FUSION WITH ALTA PLATE  . ANTERIOR CERVICAL DISCECTOMY    . CARDIAC CATHETERIZATION    . CARPAL TUNNEL RELEASE Right 07/19/2015   Procedure: Right Carpal Tunnel Release;  Surgeon: Kristeen Miss, MD;  Location: [place] [facility];  Service: Neurosurgery;  Laterality: Right;  Right Carpal tunnel release  . COLONOSCOPY    . EYE SURGERY Bilateral    cataracts  . FOOT SURGERY Left   . POSTERIOR CERVICAL FUSION/FORAMINOTOMY N/A 07/12/2015   Procedure: Cervical five-six Cervical seven-Thoracic one Posterior cervical fusion with instrumention;  Surgeon: Kristeen Miss, MD;  Location: [facility];   Service: Neurosurgery;  Laterality: N/A;  C5-6 C7-T1 Posterior cervical fusion with lateral mass fixation  . SPINE SURGERY  03/21/13   Lumbar fusion  . ULNAR NERVE TRANSPOSITION Right 07/19/2015   Procedure: Right Ulnar nerve release ;  Surgeon: Kristeen Miss, MD;  Location: [facility];  Service: Neurosurgery;  Laterality: Right;  Right Ulnar nerve release     Family History  Problem Relation Age of Onset  . Diabetes Mother   . Heart attack Mother   . Diabetes Father   . Heart disease Father   . Heart attack Father   . Diabetes Brother   . Hyperlipidemia Other   . Hypertension Other   . Colon cancer Neg Hx     Social History:  reports that he quit smoking about 18 months ago. His smoking use included Cigarettes. He started smoking about 2 years ago. He has a 6.25 pack-year smoking history. He has never used smokeless tobacco. He reports that he drinks alcohol. He reports that he does not use drugs.    Review of Systems     Mild hypertension, treated by PCP with Metoprolol now only since lisinopril was stopped after starting Invokana        Lipids: As below.  Has been on Crestor from PCP Last LDL test under 100, followed by PCP       Lab Results  Component Value Date   CHOL 127 09/07/2016   HDL 26.10 (L) 09/07/2016   LDLCALC 70 09/07/2016   TRIG 156.0 (H) 09/07/2016   CHOLHDL 5 09/07/2016                    He has Numbness, tingling and burning .  Previously on gabapentin  Foot exam in 2/17 showed the following: significantly decreased monofilament sensation in the toes especially  left fifth toe, no skin lesions or ulcers on the feet and normal pulses  Asking about persistent right spot on the right foot distally  Physical Examination:  BP 120/66   Pulse (!) 59   Ht 5\' 10"  (1.778 m)   Wt 223 lb 3.2 oz (101.2 kg)   SpO2 96%   BMI 32.03 kg/m       0.5 cm maculopapular lesion on the right distal foot No edema  ASSESSMENT:  Diabetes type 2, uncontrolled    See history of present illness for detailed description of his daily regimen, problems identified and blood sugar patterns  He is on large doses of Basal bolus insulin along with Invokana and Actos His fructosamine is 356 indicating overall still high readings although on an average's home readings are better than before with starting Invokana and increasing his basal insulin He still very insulin resistant and still needs large amounts of insulin for meal times especially in the evening when he is less active Discussed that his having much higher readings after evening meal compared to daytime readings and difficult to adjust the suppertime dose based on only morning readings  PLAN:    He will look into the freestyle [place] again while waiting for the pump coverage  Most likely will need U-500 insulin for the pump cost of the DexCom CGM or the new Medtronic CGM  For now he will need to increase his suppertime coverage by about 15 units  Discussed that if he tries to look at his bedtime readings at least 3-4 times a week he will be able to adjust the dose better  He should take the evening Toujeo around 7 or 8 PM instead of bedtime about 12 hours apart  He does need to check sugars much more frequently after breakfast and lunch meals and not necessarily every morning  Follow-up in 8 weeks   Patient Instructions  Take 65-70 Humalog units at dinner  More sugars at bedtime  Counseling time on subjects discussed in assessment and plan sections is over 50% of today's 25 minute visit    Kiauna Zywicki 10/21/2016, 8:28 AM   Note: This office note was prepared with Dragon voice recognition system technology. Any transcriptional errors that result from this process are unintentional.

## 2016-10-22 LAB — FRUCTOSAMINE: FRUCTOSAMINE: 356 umol/L — AB (ref 0–285)

## 2016-11-02 MED FILL — PIOGLITAZONE HCL 30 MG TAB: 30 | 30 days supply | Qty: 30 | Fill #0

## 2016-11-02 MED FILL — INVOKANA 300 MG TABLET: 300 | 30 days supply | Qty: 30 | Fill #2

## 2016-11-16 ENCOUNTER — Ambulatory Visit: Payer: Self-pay

## 2016-11-19 MED FILL — ROSUVASTATIN CALCIUM 10 MG: 10 | 90 days supply | Qty: 90 | Fill #2

## 2016-11-19 MED FILL — ALPRAZolam 2 MG TABS: 2 | 30 days supply | Qty: 90 | Fill #1

## 2016-11-24 DIAGNOSIS — E1069 Type 1 diabetes mellitus with other specified complication: Secondary | ICD-10-CM | POA: Diagnosis not present

## 2016-11-25 DIAGNOSIS — E1069 Type 1 diabetes mellitus with other specified complication: Secondary | ICD-10-CM | POA: Diagnosis not present

## 2016-12-02 ENCOUNTER — Telehealth: Payer: Self-pay

## 2016-12-02 NOTE — Telephone Encounter (Signed)
Cleta Alberts from [employer] is trying to get patient set up for pump training now on October 16th. We now need him scheduled to be seen by Dr. Dwyane Dee on the 17th (he is already scheduled for this day) and on October 18th (need an appt on this day)  Can you schedule Mr. Carstarphen on October 18th?   Lolita Patella will call us to confirm appt on 12/08/2016 and the one already scheduled on the 16th with Dr. Dwyane Dee will need to be canceled.  Thank you!

## 2016-12-03 ENCOUNTER — Other Ambulatory Visit: Payer: 59

## 2016-12-07 ENCOUNTER — Ambulatory Visit: Payer: 59 | Admitting: Endocrinology

## 2016-12-08 ENCOUNTER — Telehealth: Payer: Self-pay

## 2016-12-08 ENCOUNTER — Ambulatory Visit: Payer: 59 | Admitting: Endocrinology

## 2016-12-08 NOTE — Telephone Encounter (Signed)
Arthur Mata with [employer] and we need to cancel Arthur Mata today due to him not being able to schedule his pump training.   Called patient and left a voice message to let him know that I am canceling the 2 appointments back to back since he has not had pump training. He will contact us for future appointment.  I called Butch Penny and she stated that he will call us back to make these appointment. She stated that someone from our office stated that they were going to cancel these appointments and I see they are not canceled. I have asked Lattie Haw to cancel the appointment for today and tomorrow. She stated that she could cancel these.

## 2016-12-09 ENCOUNTER — Other Ambulatory Visit: Payer: Self-pay | Admitting: Endocrinology

## 2016-12-09 ENCOUNTER — Ambulatory Visit: Payer: 59 | Admitting: Endocrinology

## 2016-12-09 MED FILL — ATORVASTATIN 40 MG TABLET: 40 | 90 days supply | Qty: 90 | Fill #0

## 2016-12-09 MED FILL — BUPROPION HCL XL 300 MG TAB: 300 | 90 days supply | Qty: 90 | Fill #2

## 2016-12-09 MED FILL — PIOGLITAZONE HCL 30 MG TAB: 30 | 30 days supply | Qty: 30 | Fill #0

## 2016-12-09 MED FILL — LISINOPRIL 5 MG TAB: 5 | 90 days supply | Qty: 90 | Fill #0

## 2016-12-09 MED FILL — INVOKANA 300 MG TABLET: 300 | 30 days supply | Qty: 30 | Fill #3

## 2016-12-10 ENCOUNTER — Ambulatory Visit: Payer: Self-pay

## 2016-12-29 IMAGING — CR DG CERVICAL SPINE 2 OR 3 VIEWS
2 series · 2 of 2 positions shown · non-contrast
Comparison: Postmyelogram CT scan 09/26/2015.

CLINICAL DATA: C7-T1 ACDF.

EXAM:
CERVICAL SPINE - 2-3 VIEW

[lat (1 of 2)]
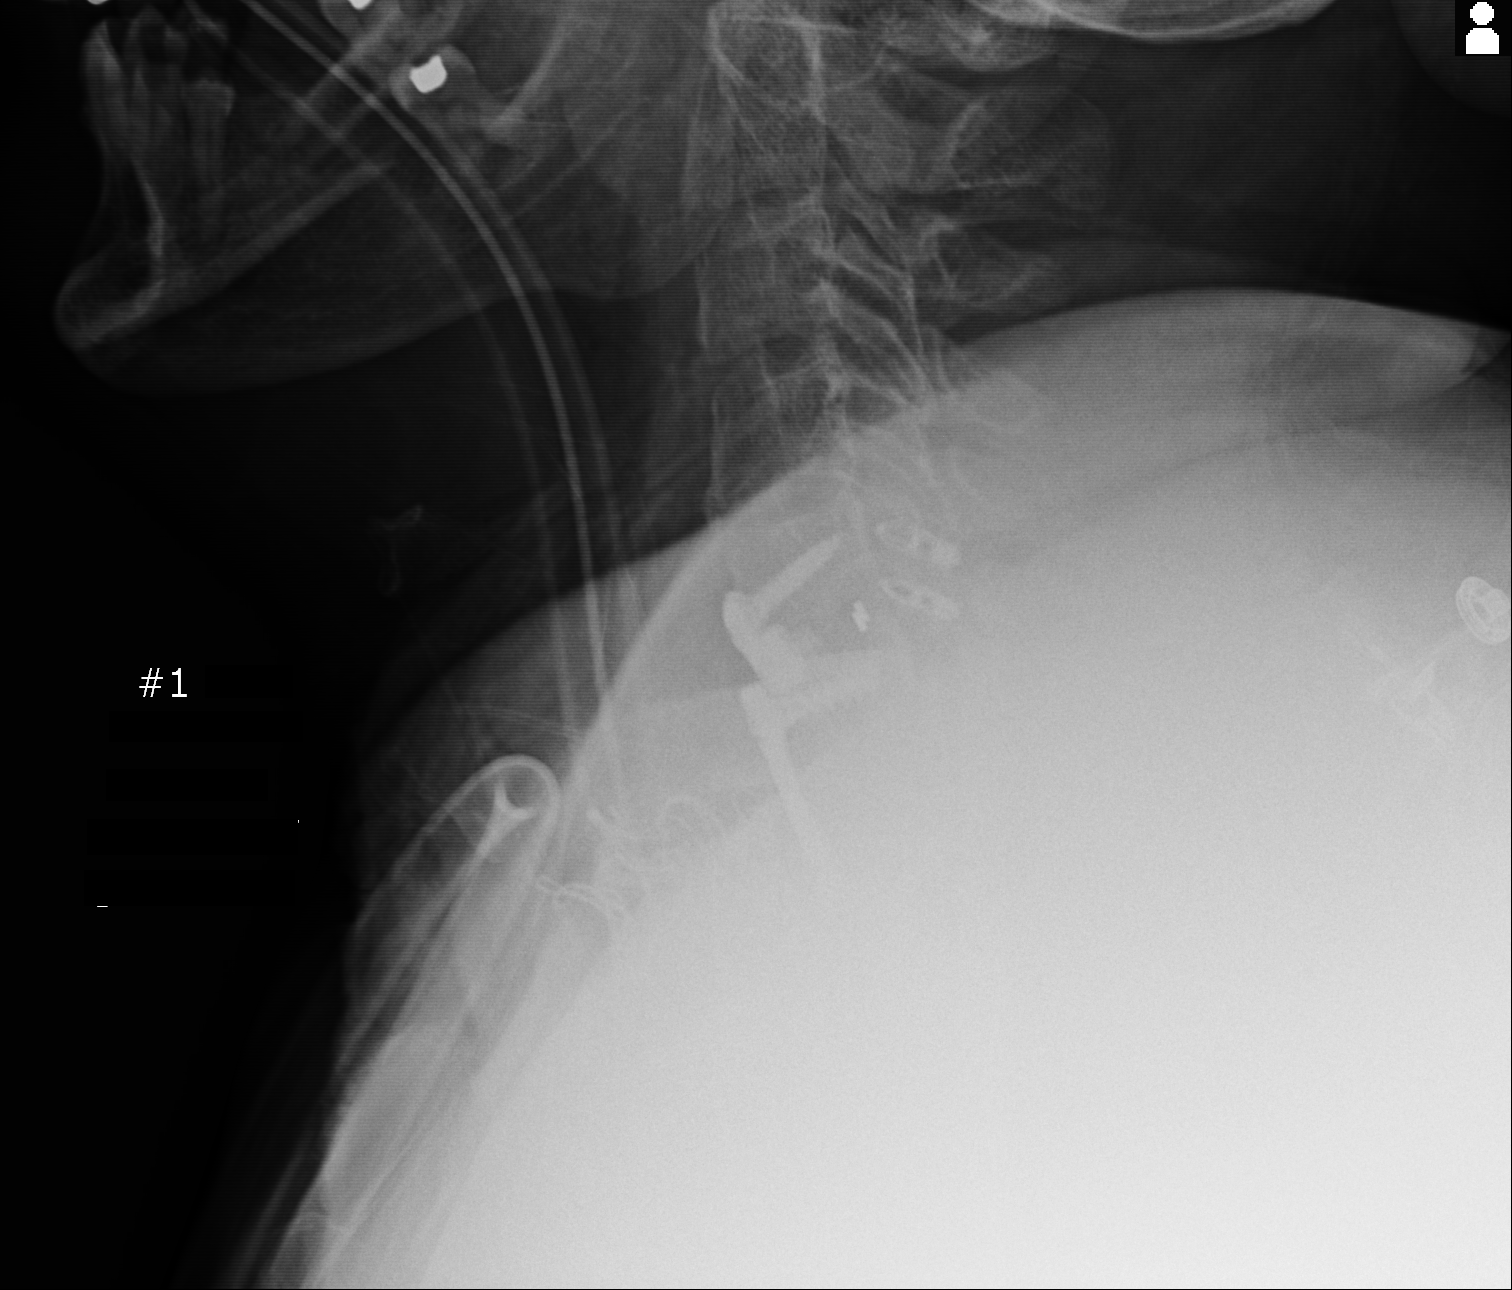

[lat (2 of 2)]
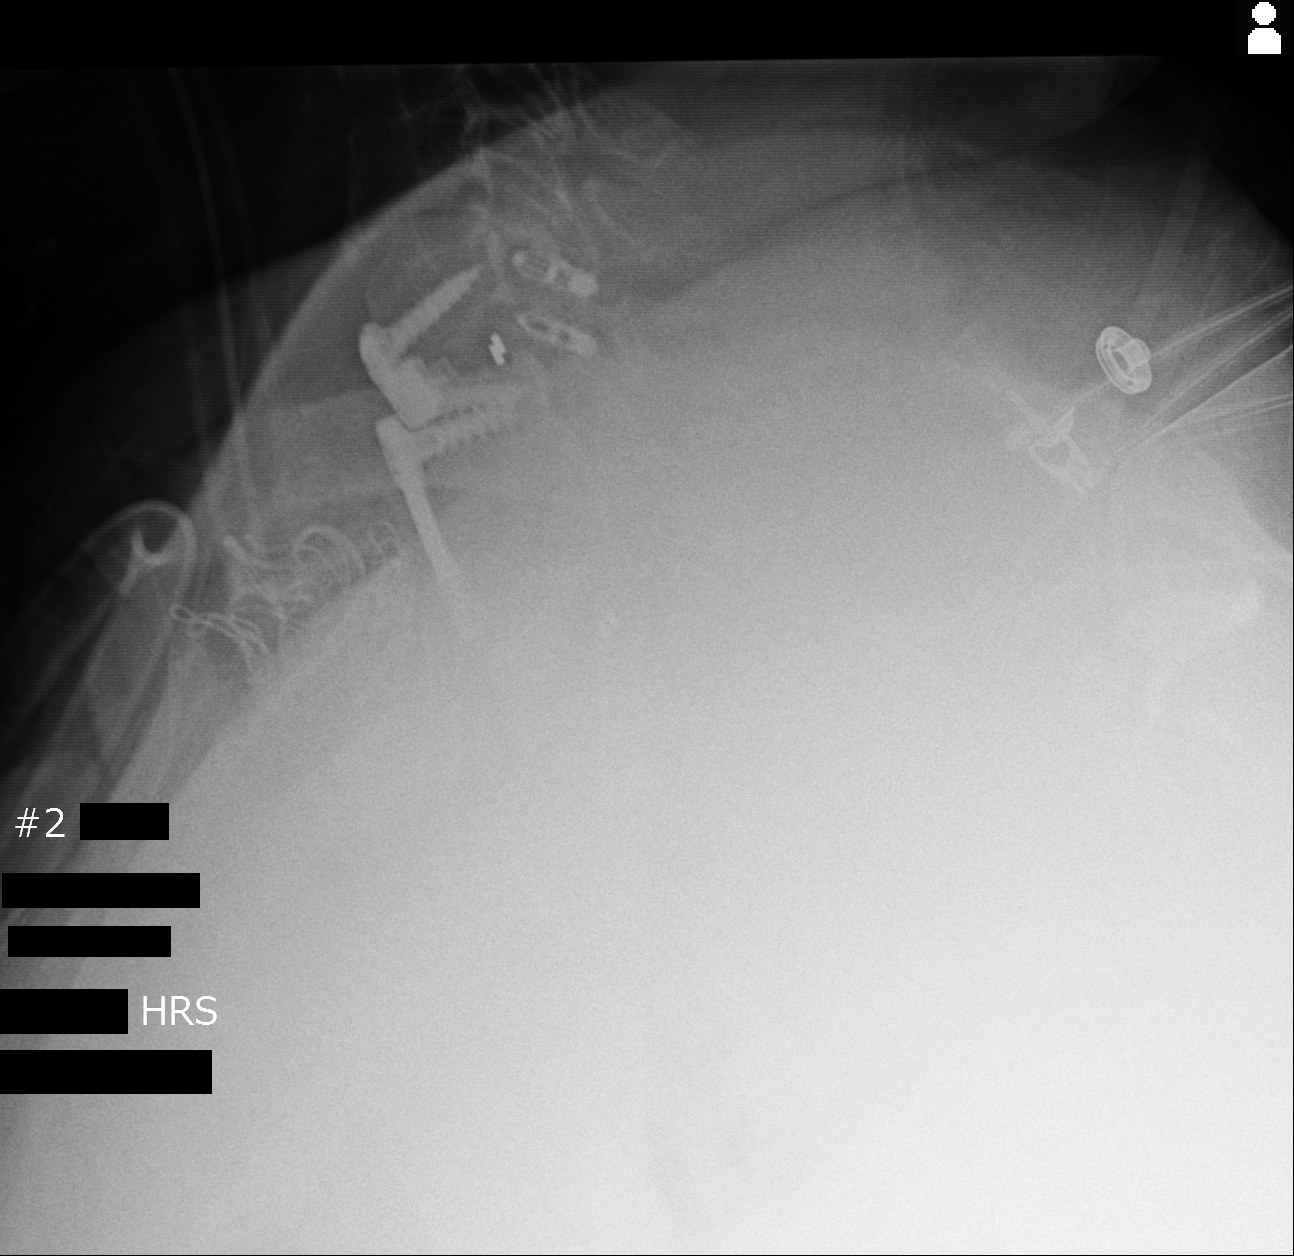

[2 of 2 positions shown; findings below may reference images not displayed]

FINDINGS: Postoperative change of prior C5-7 ACDF is identified. Although
visualization is limited, plate and screws are in place at C7-T1.
Prior fusion of the C5-6 and C7-T1 facets is noted.
IMPRESSION: C7-T1 ACDF with prior postoperative change as above.

## 2017-01-04 ENCOUNTER — Ambulatory Visit (INDEPENDENT_AMBULATORY_CARE_PROVIDER_SITE_OTHER): Payer: 59

## 2017-01-04 ENCOUNTER — Encounter: Payer: 59 | Attending: Endocrinology | Admitting: Nutrition

## 2017-01-04 DIAGNOSIS — Z23 Encounter for immunization: Secondary | ICD-10-CM | POA: Diagnosis not present

## 2017-01-04 DIAGNOSIS — E1169 Type 2 diabetes mellitus with other specified complication: Secondary | ICD-10-CM | POA: Insufficient documentation

## 2017-01-04 DIAGNOSIS — E669 Obesity, unspecified: Secondary | ICD-10-CM | POA: Diagnosis not present

## 2017-01-04 DIAGNOSIS — E119 Type 2 diabetes mellitus without complications: Secondary | ICD-10-CM | POA: Insufficient documentation

## 2017-01-04 DIAGNOSIS — Z713 Dietary counseling and surveillance: Secondary | ICD-10-CM | POA: Diagnosis not present

## 2017-01-04 NOTE — Progress Notes (Signed)
Pt. Is here with his wife, and their box of pump supplies.  He said he does not want to do this, and said he will not start this pump.  Discussed doing the training in small steps, over several months, but says it "is too much stuff, and he does not want to try this".   Per Dr. Ronnie Derby order, he was shown the OmniPod, and how to use this, and said he would try this.  Paperwork was filled out, and faxed, and he called [employer] to return the pump.  I also called leslie, and told her of the patient's decision. I explained to the patient that he will have to get credit back from the company before he can order a new pump.   Bret from [facility] was notified and paperwork was faxed to him.   Pt. Was told to call me when he gets his pump.  He agreed to do this.

## 2017-01-04 NOTE — Patient Instructions (Signed)
Call when OmniPod pump comes in.

## 2017-01-05 ENCOUNTER — Ambulatory Visit: Payer: 59

## 2017-01-06 ENCOUNTER — Ambulatory Visit: Payer: 59 | Admitting: Endocrinology

## 2017-01-06 MED FILL — AZITHROMYCIN 250 MG TABLET: 250 | 30 days supply | Qty: 6 | Fill #0

## 2017-01-11 ENCOUNTER — Other Ambulatory Visit: Payer: Self-pay | Admitting: Endocrinology

## 2017-01-11 MED FILL — ALPRAZolam 2 MG TABS: 2 | 30 days supply | Qty: 90 | Fill #2

## 2017-01-11 MED FILL — INVOKANA 300 MG TABLET: 300 | 30 days supply | Qty: 30 | Fill #0 | Status: TO

## 2017-01-11 MED FILL — METOPROLOL SUCC ER 25 MG TA: 25 | 90 days supply | Qty: 90 | Fill #0

## 2017-01-11 MED FILL — PIOGLITAZONE HCL 30 MG TAB: 30 | 30 days supply | Qty: 30 | Fill #1

## 2017-01-12 ENCOUNTER — Telehealth: Payer: Self-pay | Admitting: Endocrinology

## 2017-01-25 ENCOUNTER — Other Ambulatory Visit: Payer: Self-pay | Admitting: Internal Medicine

## 2017-01-25 MED FILL — ESOMEPRAZOLE MAG DR 40 MG C: 40 | 90 days supply | Qty: 90 | Fill #0

## 2017-01-25 MED FILL — ACCU-CHEK GUIDE TEST STRIP: 30 days supply | Qty: 100 | Fill #3

## 2017-01-26 ENCOUNTER — Ambulatory Visit (INDEPENDENT_AMBULATORY_CARE_PROVIDER_SITE_OTHER): Payer: 59 | Admitting: Family Medicine

## 2017-01-26 ENCOUNTER — Encounter: Payer: Self-pay | Admitting: Family Medicine

## 2017-01-26 VITALS — BP 119/73 | HR 65 | Wt 219.0 lb

## 2017-01-26 DIAGNOSIS — E1169 Type 2 diabetes mellitus with other specified complication: Secondary | ICD-10-CM | POA: Diagnosis not present

## 2017-01-26 DIAGNOSIS — I25119 Atherosclerotic heart disease of native coronary artery with unspecified angina pectoris: Secondary | ICD-10-CM | POA: Diagnosis not present

## 2017-01-26 DIAGNOSIS — E1140 Type 2 diabetes mellitus with diabetic neuropathy, unspecified: Secondary | ICD-10-CM | POA: Diagnosis not present

## 2017-01-26 DIAGNOSIS — I10 Essential (primary) hypertension: Secondary | ICD-10-CM | POA: Diagnosis not present

## 2017-01-26 DIAGNOSIS — E669 Obesity, unspecified: Secondary | ICD-10-CM

## 2017-01-26 DIAGNOSIS — I209 Angina pectoris, unspecified: Secondary | ICD-10-CM

## 2017-01-26 LAB — POCT GLYCOSYLATED HEMOGLOBIN (HGB A1C): Hemoglobin A1C: 8.3

## 2017-01-26 NOTE — Patient Instructions (Signed)
Thank you for coming in today. I will ask Dr Dwyane Dee about the diabetes medicine.  Recheck in 3 months.  We will try to get records from Dr Victorio Palm office.

## 2017-01-26 NOTE — Progress Notes (Signed)
Arthur Mata is a 61 y.o. male who presents to [facility] today for establish care and discuss diabetes, anxiety and, and HTN.   Arthur Mata has difficulty to control DM2. He takes the medications listed below. Toujeo 120 units daily, Humalog 130 total units daily, Invokana 300mg  daily, Pioglitazone 30 mg daily. He notes his blood sugar typically in the 130s-150s range. He states that his lifestyle is not compatible with a insulin pump. He is active and is worried that the pump will become dislodged. He denies current polyuria or polydipsia and feels pretty well. In the past he took Victoza but can't remember why he stopped it. His diabetes is currently managed by Dr. Dwyane Dee at [facility] endocrinology.  Anxiety: Arthur Mata has anxiety that is controlled with Xanax. He takes 1-2 Xanax tablets per day. These are 2 mg tablets. He typically will only take the Xanax at bedtime and notes that it is sufficient to control his anxiety. He notes that in the past she was on some other anxiety medicine but can't remember what it was. ( medical records are pending)  Hypertension: Arthur Mata takes metoprolol and lisinopril for hypertension and renal and cardiac prophylaxis. He denies chest pain lightheadedness or dizziness.   Past Medical History:  Diagnosis Date  . Angina pectoris ([place])   . Arthritis   . Bone spur of left foot   . Chest pain   . COPD (chronic obstructive pulmonary disease) ([place])   . Coronary artery disease    no stents - some blockage (35% in "widowmaker) - 2014  . Diabetes mellitus without complication ([place])    type 2  . Dysrhythmia    goes fast at times  . GERD (gastroesophageal reflux disease)   . History of bronchitis   . History of hiatal hernia   . History of pneumonia   . Hyperlipidemia   . Hypertension   . Measles   . Mumps   . Near syncope   . Neuropathy   . Obesity    . Palpitations   . Pneumonia    Past Surgical History:  Procedure Laterality Date  . ANTERIOR CERVICAL DECOMP/DISCECTOMY FUSION N/A 04/26/2015   Procedure: Anterior Cervical Discectomy and Fusion - Cervical five-Cervical six with alta interbody;  Surgeon: Kristeen Miss, MD;  Location: [place] [facility];  Service: Neurosurgery;  Laterality: N/A;  . ANTERIOR CERVICAL DECOMP/DISCECTOMY FUSION N/A 10/21/2015   Procedure: Cervical seven-Thoracic one ANTERIOR CERVICAL DECOMPRESSION/DISKECTOMY/FUSION WITH ALTA PLATE;  Surgeon: Kristeen Miss, MD;  Location: [place];  Service: Neurosurgery;  Laterality: N/A;  C7-T1 ANTERIOR CERVICAL DECOMPRESSION/DISKECTOMY/FUSION WITH ALTA PLATE  . ANTERIOR CERVICAL DISCECTOMY    . CARDIAC CATHETERIZATION    . CARPAL TUNNEL RELEASE Right 07/19/2015   Procedure: Right Carpal Tunnel Release;  Surgeon: Kristeen Miss, MD;  Location: [facility];  Service: Neurosurgery;  Laterality: Right;  Right Carpal tunnel release  . COLONOSCOPY    . EYE SURGERY Bilateral    cataracts  . FOOT SURGERY Left   . POSTERIOR CERVICAL FUSION/FORAMINOTOMY N/A 07/12/2015   Procedure: Cervical five-six Cervical seven-Thoracic one Posterior cervical fusion with instrumention;  Surgeon: Kristeen Miss, MD;  Location: [facility];  Service: Neurosurgery;  Laterality: N/A;  C5-6 C7-T1 Posterior cervical fusion with lateral mass fixation  . SPINE SURGERY  03/21/13   Lumbar fusion  . ULNAR NERVE TRANSPOSITION Right 07/19/2015   Procedure: Right Ulnar nerve release ;  Surgeon: Kristeen Miss, MD;  Location: [facility];  Service: Neurosurgery;  Laterality: Right;  Right Ulnar nerve release    Social History   Tobacco Use  . Smoking status: Former Smoker    Packs/day: 0.25    Years: 25.00    Pack years: 6.25    Types: Cigarettes    Start date: 02/23/2014    Last attempt to quit: 03/27/2015    Years since quitting: 1.8  . Smokeless tobacco: Never Used  . Tobacco comment: Recently quit as of February   2017  Substance Use Topics  . Alcohol use: Yes    Alcohol/week: 0.0 oz    Comment: rarely   family history includes Diabetes in his brother, father, and mother; Heart attack in his father and mother; Heart disease in his father; Hyperlipidemia in his other; Hypertension in his other.  ROS as above: No headache, visual changes, nausea, vomiting, diarrhea, constipation, dizziness, abdominal pain, skin rash, fevers, chills, night sweats, weight loss, swollen lymph nodes, body aches, joint swelling, muscle aches, chest pain, shortness of breath, mood changes, visual or auditory hallucinations.    Medications: Current Outpatient Medications  Medication Sig Dispense Refill  . alprazolam (XANAX) 2 MG tablet Take 1-2 mg by mouth 2 (two) times daily as needed for anxiety.  5  . atorvastatin (LIPITOR) 40 MG tablet Take 40 mg by mouth every evening.   3  . buPROPion (WELLBUTRIN XL) 300 MG 24 hr tablet   3  . esomeprazole (NEXIUM) 40 MG capsule TAKE 1 CAPSULE (40 MG TOTAL) BY MOUTH DAILY. 90 capsule 0  . HUMALOG KWIKPEN 200 UNIT/ML SOPN INJECT 50 UNITS AT BREAKFAST, 30 UNITS AT LUNCH, AND 50 UNITS AT DINNER. A MAX DAILY DOSE OF 130 UNITS 48 mL 3  . INVOKANA 300 MG TABS tablet TAKE 1 TABLET (300 MG TOTAL) BY MOUTH DAILY BEFORE BREAKFAST. 30 tablet 3  . lisinopril (PRINIVIL,ZESTRIL) 5 MG tablet Take 5 mg by mouth every evening.   3  . metoprolol succinate (TOPROL-XL) 25 MG 24 hr tablet Take 25 mg by mouth at bedtime.     . pioglitazone (ACTOS) 30 MG tablet TAKE 1 TABLET (30 MG TOTAL) BY MOUTH DAILY. 30 tablet 3  . rosuvastatin (CRESTOR) 10 MG tablet   3  . TOUJEO SOLOSTAR 300 UNIT/ML SOPN INJECT 120 UNITS INTO THE SKIN DAILY BEFORE BREAKFAST. 13.5 mL 3   Current Facility-Administered Medications  Medication Dose Route Frequency Provider Last Rate Last Dose  . 0.9 %  sodium chloride infusion  500 mL Intravenous Continuous Ladene Artist, MD       Allergies  Allergen Reactions  . Prednisone Other  (See Comments)    Run sugar over 600 causes hospitalization    Health Maintenance Health Maintenance  Topic Date Due  . Hepatitis C Screening  01-Nov-1955  . PNEUMOCOCCAL POLYSACCHARIDE VACCINE (1) 03/02/1959  . HIV Screening  03/01/1972  . OPHTHALMOLOGY EXAM  04/10/2015  . FOOT EXAM  04/03/2016  . HEMOGLOBIN A1C  03/11/2017  . COLONOSCOPY  03/19/2019  . TETANUS/TDAP  12/31/2025  . INFLUENZA VACCINE  Completed     Exam:  BP 119/73   Pulse 65   Wt 219 lb (99.3 kg)   BMI 31.42 kg/m  Gen: Well NAD HEENT: EOMI,  MMM Lungs: Normal work of breathing. CTABL Heart: RRR no MRG Abd: NABS, Soft. Nondistended, Nontender Exts: Brisk capillary refill, warm and well perfused.    Results for orders placed or performed in visit on 01/26/17 (from the past 72  hour(s))  POCT HgB A1C     Status: None   Collection Time: 01/26/17  1:35 PM  Result Value Ref Range   Hemoglobin A1C 8.3    No results found.    Assessment and Plan: 61 y.o. male with  Diabetes: not out of control but not well-controlled either.  Arthur Mata has almost maximized his insulin. Ideally I think an insulin pump would be a great idea but I don't think his lifestyle is compatible with an insulin pump. He may benefit from GLP-1 therapy as an adjunct to mealtime insulin.  I have written a note to his endocrinologist for feedback on this idea.  Otherwise follow up with endocrinology and follow-up with me in 3 months..  HypertensionCAD: blood pressure control today. Review of previous metabolic panel shows normal creatinine and electrolytes. Recheck in 3 months.  Anxiety: currently controll however I think in the long term relatively high doses of Xanax or not good idea. We discussed weaning off of this medicine in the near future.   Orders Placed This Encounter  Procedures  . POCT HgB A1C   No orders of the defined types were placed in this encounter.    Discussed warning signs or symptoms. Please see discharge  instructions. Patient expresses understanding.

## 2017-02-03 ENCOUNTER — Telehealth: Payer: Self-pay | Admitting: Nutrition

## 2017-02-03 NOTE — Telephone Encounter (Signed)
omnipod cmn needs to have the pod change rate on it and resend it please [phone]

## 2017-02-03 NOTE — Telephone Encounter (Signed)
Patient is wanting to know what the status is on his return of his Medtronic pump.  Pt. Is anxious about starting the OmnIPod pump. Message left for LouAnna to call me with this information.

## 2017-02-04 ENCOUNTER — Telehealth: Payer: Self-pay | Admitting: Family Medicine

## 2017-02-05 NOTE — Telephone Encounter (Signed)
I spoke with Wilfred Lacy from [facility] today and he will have the paperwork faxed again to our office for patient.

## 2017-02-05 NOTE — Telephone Encounter (Signed)
Has anyone seen this paperwork?

## 2017-02-11 MED FILL — ROSUVASTATIN CALCIUM 10 MG: 10 | 90 days supply | Qty: 90 | Fill #3

## 2017-02-11 MED FILL — INVOKANA 300 MG TABLET: 300 | 30 days supply | Qty: 30 | Fill #1 | Status: TO

## 2017-02-11 MED FILL — PIOGLITAZONE HCL 30 MG TAB: 30 | 30 days supply | Qty: 30 | Fill #2

## 2017-02-17 NOTE — Telephone Encounter (Signed)
Arthur Mata called back and stated that [employer] has not sent the patient the return tags for the Medtronic pump. Arthur Mata called Arthur Mata and Arthur Mata stated that he may stick with the insulin pens because the Omnipod system will cost around $55 a month. Omnipod is not able to process anything until Medtronic is returned.

## 2017-02-17 NOTE — Telephone Encounter (Signed)
Called Loyall from [facility] and left a voice message for him to let me know where we are in the process of the [place] for [place].

## 2017-02-24 ENCOUNTER — Telehealth: Payer: Self-pay | Admitting: Nutrition

## 2017-02-24 NOTE — Telephone Encounter (Signed)
Note opened in error.

## 2017-02-24 NOTE — Telephone Encounter (Signed)
Message left on machine to see if he has received his OmniPod, and to call to schedule for this.

## 2017-03-10 MED FILL — ATORVASTATIN 40 MG TABLET: 40 | 90 days supply | Qty: 90 | Fill #1

## 2017-03-10 MED FILL — ALPRAZolam 2 MG TABS: 2 | 30 days supply | Qty: 90 | Fill #0

## 2017-03-10 MED FILL — BUPROPION HCL XL 300 MG TAB: 300 | 90 days supply | Qty: 90 | Fill #3

## 2017-03-11 MED FILL — LISINOPRIL 5 MG TAB: 5 | 90 days supply | Qty: 90 | Fill #1

## 2017-03-23 MED FILL — PIOGLITAZONE HCL 30 MG TAB: 30 | 30 days supply | Qty: 30 | Fill #3

## 2017-04-13 ENCOUNTER — Other Ambulatory Visit: Payer: Self-pay | Admitting: Endocrinology

## 2017-04-13 MED FILL — ACCU-CHEK GUIDE TEST STRIP: 34 days supply | Qty: 100 | Fill #0

## 2017-04-13 MED FILL — INVOKANA 300 MG TABLET: 300 | 30 days supply | Qty: 30 | Fill #2 | Status: TO

## 2017-04-13 MED FILL — METOPROLOL SUCCINATE ER 25: 25 | 90 days supply | Qty: 90 | Fill #1

## 2017-04-14 ENCOUNTER — Other Ambulatory Visit: Payer: Self-pay | Admitting: Endocrinology

## 2017-04-14 MED FILL — HUMALOG 200 UNITS/ML KWIKPE: 200 | 28 days supply | Qty: 12 | Fill #0

## 2017-04-14 MED FILL — PIOGLITAZONE HCL 30 MG TAB: 30 | 30 days supply | Qty: 30 | Fill #0

## 2017-04-14 MED FILL — TOUJEO SOLOSTAR 300 UNITS/M: 300 | 34 days supply | Qty: 14 | Fill #1

## 2017-04-19 ENCOUNTER — Other Ambulatory Visit: Payer: Self-pay | Admitting: Internal Medicine

## 2017-04-19 MED FILL — ESOMEPRAZOLE MAG DR 40 MG C: 40 | 90 days supply | Qty: 90 | Fill #0

## 2017-04-26 ENCOUNTER — Ambulatory Visit: Payer: 59 | Admitting: Family Medicine

## 2017-04-26 DIAGNOSIS — Z0189 Encounter for other specified special examinations: Secondary | ICD-10-CM

## 2017-05-03 ENCOUNTER — Other Ambulatory Visit: Payer: Self-pay

## 2017-05-03 MED ORDER — PIOGLITAZONE HCL 30 MG PO TABS: 30.0000 mg | ORAL_TABLET | Freq: Every day | ORAL | 2 refills | Status: DC

## 2017-05-28 ENCOUNTER — Telehealth: Payer: Self-pay | Admitting: Endocrinology

## 2017-05-28 MED ORDER — INSULIN LISPRO 200 UNIT/ML ~~LOC~~ SOPN: 50.0000 [IU] | PEN_INJECTOR | Freq: Every day | SUBCUTANEOUS | 3 refills | Status: AC

## 2017-05-28 NOTE — Telephone Encounter (Signed)
Refill submitted. 

## 2017-05-28 NOTE — Telephone Encounter (Signed)
HUMALOG KWIKPEN 200 UNIT/ML SOPN  [facility] is calling in regards to patients medication. They are needing a refill sent over to them     [place], [place] ([facility] [place]))

## 2018-06-09 ENCOUNTER — Other Ambulatory Visit: Payer: Self-pay | Admitting: Endocrinology
